# Patient Record
Sex: Female | Born: 1940
Health system: Southern US, Community
[De-identification: ages and names within clinical notes are randomized; demographics above are authoritative.]

## PROBLEM LIST (undated history)

## (undated) DIAGNOSIS — K5790 Diverticulosis of intestine, part unspecified, without perforation or abscess without bleeding: Secondary | ICD-10-CM

## (undated) DIAGNOSIS — J45909 Unspecified asthma, uncomplicated: Secondary | ICD-10-CM

## (undated) DIAGNOSIS — E119 Type 2 diabetes mellitus without complications: Secondary | ICD-10-CM

## (undated) DIAGNOSIS — K648 Other hemorrhoids: Secondary | ICD-10-CM

## (undated) DIAGNOSIS — I10 Essential (primary) hypertension: Secondary | ICD-10-CM

## (undated) DIAGNOSIS — E785 Hyperlipidemia, unspecified: Secondary | ICD-10-CM

## (undated) DIAGNOSIS — J309 Allergic rhinitis, unspecified: Secondary | ICD-10-CM

## (undated) HISTORY — DX: Other hemorrhoids: K64.8

## (undated) HISTORY — PX: CATARACT EXTRACTION: SUR2

## (undated) HISTORY — DX: Diverticulosis of intestine, part unspecified, without perforation or abscess without bleeding: K57.90

## (undated) HISTORY — PX: COLONOSCOPY: SHX174

## (undated) HISTORY — DX: Type 2 diabetes mellitus without complications: E11.9

## (undated) HISTORY — DX: Essential (primary) hypertension: I10

## (undated) HISTORY — DX: Unspecified asthma, uncomplicated: J45.909

## (undated) HISTORY — DX: Allergic rhinitis, unspecified: J30.9

## (undated) HISTORY — DX: Hyperlipidemia, unspecified: E78.5

---

## 2007-06-19 ENCOUNTER — Ambulatory Visit: Payer: Self-pay | Admitting: Gastroenterology

## 2010-11-10 ENCOUNTER — Ambulatory Visit: Payer: Self-pay | Admitting: Ophthalmology

## 2011-01-11 ENCOUNTER — Ambulatory Visit: Payer: Self-pay | Admitting: Internal Medicine

## 2011-03-02 ENCOUNTER — Ambulatory Visit: Payer: Self-pay | Admitting: Ophthalmology

## 2011-11-22 ENCOUNTER — Ambulatory Visit: Payer: Self-pay | Admitting: Family Medicine

## 2012-01-18 ENCOUNTER — Other Ambulatory Visit: Payer: Self-pay | Admitting: Diagnostic Radiology

## 2012-01-18 LAB — CREATININE, SERUM: EGFR (Non-African Amer.): >60

## 2012-01-19 ENCOUNTER — Ambulatory Visit: Payer: Self-pay

## 2013-04-10 ENCOUNTER — Emergency Department: Payer: Self-pay | Admitting: Emergency Medicine

## 2013-04-10 LAB — CBC
HCT: 35.1 % (ref 35.0–47.0)
HGB: 11.5 g/dL — ABNORMAL LOW (ref 12.0–16.0)
MCH: 30.2 pg (ref 26.0–34.0)
MCHC: 32.8 g/dL (ref 32.0–36.0)
MCV: 92 fL (ref 80–100)
Platelet: 207 10*3/uL (ref 150–440)
RBC: 3.82 10*6/uL (ref 3.80–5.20)
RDW: 13.8 % (ref 11.5–14.5)
WBC: 10 10*3/uL (ref 3.6–11.0)

## 2013-04-10 LAB — URINALYSIS, COMPLETE
Bilirubin,UR: NEGATIVE
Glucose,UR: NEGATIVE mg/dL (ref 0–75)
Hyaline Cast: 20
Nitrite: NEGATIVE
Protein: NEGATIVE
RBC,UR: 19 /HPF (ref 0–5)
Specific Gravity: 1.018 (ref 1.003–1.030)
Squamous Epithelial: 18
WBC UR: 15 /HPF (ref 0–5)

## 2013-04-10 LAB — COMPREHENSIVE METABOLIC PANEL
Albumin: 3.7 g/dL (ref 3.4–5.0)
Anion Gap: 7 (ref 7–16)
Bilirubin,Total: 0.2 mg/dL (ref 0.2–1.0)
Calcium, Total: 9.3 mg/dL (ref 8.5–10.1)
Chloride: 104 mmol/L (ref 98–107)
Co2: 26 mmol/L (ref 21–32)
Creatinine: 1.44 mg/dL — ABNORMAL HIGH (ref 0.60–1.30)
EGFR (Non-African Amer.): 36 — ABNORMAL LOW
Glucose: 123 mg/dL — ABNORMAL HIGH (ref 65–99)
Potassium: 3.4 mmol/L — ABNORMAL LOW (ref 3.5–5.1)
Sodium: 137 mmol/L (ref 136–145)
Total Protein: 7.3 g/dL (ref 6.4–8.2)

## 2013-04-10 LAB — LIPASE, BLOOD: Lipase: 71 U/L — ABNORMAL LOW (ref 73–393)

## 2013-04-16 ENCOUNTER — Ambulatory Visit: Payer: Self-pay | Admitting: Emergency Medicine

## 2013-04-16 LAB — URINALYSIS, COMPLETE
Bilirubin,UR: NEGATIVE
Blood: NEGATIVE
Glucose,UR: NEGATIVE mg/dL (ref 0–75)
Ketone: NEGATIVE
Nitrite: NEGATIVE
Ph: 6.5 (ref 4.5–8.0)
Protein: NEGATIVE
Specific Gravity: 1.01 (ref 1.003–1.030)

## 2013-04-18 LAB — URINE CULTURE

## 2013-04-22 ENCOUNTER — Ambulatory Visit: Payer: Self-pay | Admitting: Gastroenterology

## 2013-04-23 LAB — PATHOLOGY REPORT

## 2013-07-17 ENCOUNTER — Inpatient Hospital Stay: Payer: Self-pay | Admitting: Family Medicine

## 2013-07-17 LAB — BASIC METABOLIC PANEL
Anion Gap: 8 (ref 7–16)
BUN: 22 mg/dL — ABNORMAL HIGH (ref 7–18)
Calcium, Total: 9.6 mg/dL (ref 8.5–10.1)
Co2: 32 mmol/L (ref 21–32)
EGFR (African American): 42 — ABNORMAL LOW
EGFR (Non-African Amer.): 37 — ABNORMAL LOW
Glucose: 191 mg/dL — ABNORMAL HIGH (ref 65–99)
Sodium: 137 mmol/L (ref 136–145)

## 2013-07-17 LAB — CBC WITH DIFFERENTIAL/PLATELET
Basophil #: 0 10*3/uL (ref 0.0–0.1)
Basophil %: 0.2 %
HCT: 35.1 % (ref 35.0–47.0)
HGB: 11.8 g/dL — ABNORMAL LOW (ref 12.0–16.0)
Lymphocyte %: 6.3 %
MCH: 30.7 pg (ref 26.0–34.0)
MCHC: 33.5 g/dL (ref 32.0–36.0)
MCV: 92 fL (ref 80–100)
Monocyte #: 0.4 x10 3/mm (ref 0.2–0.9)
Neutrophil #: 11.9 10*3/uL — ABNORMAL HIGH (ref 1.4–6.5)
RBC: 3.83 10*6/uL (ref 3.80–5.20)
RDW: 13.7 % (ref 11.5–14.5)
WBC: 13.2 10*3/uL — ABNORMAL HIGH (ref 3.6–11.0)

## 2013-07-17 LAB — PRO B NATRIURETIC PEPTIDE: B-Type Natriuretic Peptide: 235 pg/mL — ABNORMAL HIGH (ref 0–125)

## 2013-07-17 LAB — MAGNESIUM: Magnesium: 1.1 mg/dL — ABNORMAL LOW

## 2013-07-17 LAB — CK TOTAL AND CKMB (NOT AT ARMC)
CK, Total: 104 U/L (ref 21–215)
CK-MB: 1.2 ng/mL (ref 0.5–3.6)

## 2013-07-18 LAB — COMPREHENSIVE METABOLIC PANEL
Albumin: 3.3 g/dL — ABNORMAL LOW (ref 3.4–5.0)
Alkaline Phosphatase: 81 U/L (ref 50–136)
BUN: 23 mg/dL — ABNORMAL HIGH (ref 7–18)
Calcium, Total: 9 mg/dL (ref 8.5–10.1)
Co2: 30 mmol/L (ref 21–32)
Creatinine: 1.59 mg/dL — ABNORMAL HIGH (ref 0.60–1.30)
EGFR (African American): 37 — ABNORMAL LOW
Osmolality: 281 (ref 275–301)
SGOT(AST): 18 U/L (ref 15–37)
SGPT (ALT): 17 U/L (ref 12–78)
Sodium: 135 mmol/L — ABNORMAL LOW (ref 136–145)

## 2013-07-18 LAB — CBC WITH DIFFERENTIAL/PLATELET
Eosinophil %: 0 %
HCT: 32.4 % — ABNORMAL LOW (ref 35.0–47.0)
HGB: 10.8 g/dL — ABNORMAL LOW (ref 12.0–16.0)
MCHC: 33.2 g/dL (ref 32.0–36.0)
Monocyte #: 0.1 x10 3/mm — ABNORMAL LOW (ref 0.2–0.9)
Neutrophil #: 17.4 10*3/uL — ABNORMAL HIGH (ref 1.4–6.5)
RDW: 13.6 % (ref 11.5–14.5)
WBC: 18 10*3/uL — ABNORMAL HIGH (ref 3.6–11.0)

## 2013-07-18 LAB — CK TOTAL AND CKMB (NOT AT ARMC)
CK-MB: 1.3 ng/mL (ref 0.5–3.6)
CK-MB: 1.4 ng/mL (ref 0.5–3.6)

## 2013-07-18 LAB — TROPONIN I: Troponin-I: 0.07 ng/mL — ABNORMAL HIGH

## 2013-07-22 LAB — CULTURE, BLOOD (SINGLE)

## 2013-07-22 LAB — PLATELET COUNT: Platelet: 209 10*3/uL (ref 150–440)

## 2014-03-04 ENCOUNTER — Inpatient Hospital Stay: Payer: Self-pay | Admitting: Internal Medicine

## 2014-03-04 LAB — COMPREHENSIVE METABOLIC PANEL
ALT: 14 U/L (ref 12–78)
AST: 21 U/L (ref 15–37)
Albumin: 3.8 g/dL (ref 3.4–5.0)
Alkaline Phosphatase: 67 U/L
Anion Gap: 6 — ABNORMAL LOW (ref 7–16)
BILIRUBIN TOTAL: 0.3 mg/dL (ref 0.2–1.0)
BUN: 22 mg/dL — ABNORMAL HIGH (ref 7–18)
CO2: 31 mmol/L (ref 21–32)
Calcium, Total: 9.4 mg/dL (ref 8.5–10.1)
Chloride: 104 mmol/L (ref 98–107)
Creatinine: 1.39 mg/dL — ABNORMAL HIGH (ref 0.60–1.30)
EGFR (African American): 43 — ABNORMAL LOW
EGFR (Non-African Amer.): 37 — ABNORMAL LOW
Glucose: 172 mg/dL — ABNORMAL HIGH (ref 65–99)
Osmolality: 289 (ref 275–301)
Potassium: 3.6 mmol/L (ref 3.5–5.1)
Sodium: 141 mmol/L (ref 136–145)
Total Protein: 7.3 g/dL (ref 6.4–8.2)

## 2014-03-04 LAB — CBC
HCT: 36.2 % (ref 35.0–47.0)
HGB: 11.4 g/dL — ABNORMAL LOW (ref 12.0–16.0)
MCH: 29.4 pg (ref 26.0–34.0)
MCHC: 31.4 g/dL — ABNORMAL LOW (ref 32.0–36.0)
MCV: 94 fL (ref 80–100)
Platelet: 173 10*3/uL (ref 150–440)
RBC: 3.86 10*6/uL (ref 3.80–5.20)
RDW: 14.3 % (ref 11.5–14.5)
WBC: 8.5 10*3/uL (ref 3.6–11.0)

## 2014-03-04 LAB — TROPONIN I: Troponin-I: <0.02 ng/mL

## 2014-03-05 LAB — CBC WITH DIFFERENTIAL/PLATELET
Basophil #: 0 10*3/uL (ref 0.0–0.1)
Basophil %: 0.1 %
Eosinophil #: 0 10*3/uL (ref 0.0–0.7)
Eosinophil %: 0.1 %
HCT: 33.1 % — ABNORMAL LOW (ref 35.0–47.0)
HGB: 10.5 g/dL — ABNORMAL LOW (ref 12.0–16.0)
LYMPHS PCT: 6.6 %
Lymphocyte #: 0.7 10*3/uL — ABNORMAL LOW (ref 1.0–3.6)
MCH: 29.5 pg (ref 26.0–34.0)
MCHC: 31.8 g/dL — AB (ref 32.0–36.0)
MCV: 93 fL (ref 80–100)
Monocyte #: 0.1 x10 3/mm — ABNORMAL LOW (ref 0.2–0.9)
Monocyte %: 0.9 %
Neutrophil #: 9.1 10*3/uL — ABNORMAL HIGH (ref 1.4–6.5)
Neutrophil %: 92.3 %
Platelet: 185 10*3/uL (ref 150–440)
RBC: 3.57 10*6/uL — ABNORMAL LOW (ref 3.80–5.20)
RDW: 14.2 % (ref 11.5–14.5)
WBC: 9.9 10*3/uL (ref 3.6–11.0)

## 2014-03-05 LAB — BASIC METABOLIC PANEL
Anion Gap: 8 (ref 7–16)
BUN: 24 mg/dL — AB (ref 7–18)
CREATININE: 1.37 mg/dL — AB (ref 0.60–1.30)
Calcium, Total: 9.4 mg/dL (ref 8.5–10.1)
Chloride: 101 mmol/L (ref 98–107)
Co2: 29 mmol/L (ref 21–32)
EGFR (African American): 44 — ABNORMAL LOW
GFR CALC NON AF AMER: 38 — AB
Glucose: 176 mg/dL — ABNORMAL HIGH (ref 65–99)
Osmolality: 284 (ref 275–301)
Potassium: 3.9 mmol/L (ref 3.5–5.1)
SODIUM: 138 mmol/L (ref 136–145)

## 2014-03-07 LAB — HEMOGLOBIN A1C: Hemoglobin A1C: 7.1 % — ABNORMAL HIGH (ref 4.2–6.3)

## 2014-06-25 ENCOUNTER — Inpatient Hospital Stay: Payer: Self-pay | Admitting: Internal Medicine

## 2014-06-25 LAB — CBC
HCT: 35 % (ref 35.0–47.0)
HGB: 11.1 g/dL — ABNORMAL LOW (ref 12.0–16.0)
MCH: 30.1 pg (ref 26.0–34.0)
MCHC: 31.7 g/dL — ABNORMAL LOW (ref 32.0–36.0)
MCV: 95 fL (ref 80–100)
Platelet: 208 10*3/uL (ref 150–440)
RBC: 3.69 10*6/uL — AB (ref 3.80–5.20)
RDW: 14 % (ref 11.5–14.5)
WBC: 9.2 10*3/uL (ref 3.6–11.0)

## 2014-06-25 LAB — BASIC METABOLIC PANEL
Anion Gap: 7 (ref 7–16)
BUN: 15 mg/dL (ref 7–18)
CALCIUM: 8.8 mg/dL (ref 8.5–10.1)
CHLORIDE: 106 mmol/L (ref 98–107)
CO2: 29 mmol/L (ref 21–32)
Creatinine: 1.26 mg/dL (ref 0.60–1.30)
EGFR (Non-African Amer.): 44 — ABNORMAL LOW
GFR CALC AF AMER: 53 — AB
Glucose: 143 mg/dL — ABNORMAL HIGH (ref 65–99)
OSMOLALITY: 286 (ref 275–301)
POTASSIUM: 4 mmol/L (ref 3.5–5.1)
Sodium: 142 mmol/L (ref 136–145)

## 2014-06-25 LAB — TROPONIN I

## 2014-06-26 LAB — CBC WITH DIFFERENTIAL/PLATELET
BASOS ABS: 0 10*3/uL (ref 0.0–0.1)
Basophil %: 0.2 %
EOS ABS: 0 10*3/uL (ref 0.0–0.7)
Eosinophil %: 0.1 %
HCT: 35.5 % (ref 35.0–47.0)
HGB: 11.3 g/dL — ABNORMAL LOW (ref 12.0–16.0)
LYMPHS ABS: 0.8 10*3/uL — AB (ref 1.0–3.6)
Lymphocyte %: 5.1 %
MCH: 29.9 pg (ref 26.0–34.0)
MCHC: 31.8 g/dL — ABNORMAL LOW (ref 32.0–36.0)
MCV: 94 fL (ref 80–100)
MONO ABS: 0.2 x10 3/mm (ref 0.2–0.9)
Monocyte %: 1.4 %
Neutrophil #: 15 10*3/uL — ABNORMAL HIGH (ref 1.4–6.5)
Neutrophil %: 93.2 %
Platelet: 225 10*3/uL (ref 150–440)
RBC: 3.79 10*6/uL — ABNORMAL LOW (ref 3.80–5.20)
RDW: 14 % (ref 11.5–14.5)
WBC: 16.1 10*3/uL — ABNORMAL HIGH (ref 3.6–11.0)

## 2014-06-26 LAB — BASIC METABOLIC PANEL
Anion Gap: 10 (ref 7–16)
BUN: 18 mg/dL (ref 7–18)
CHLORIDE: 106 mmol/L (ref 98–107)
Calcium, Total: 9.4 mg/dL (ref 8.5–10.1)
Co2: 25 mmol/L (ref 21–32)
Creatinine: 1.25 mg/dL (ref 0.60–1.30)
EGFR (Non-African Amer.): 45 — ABNORMAL LOW
GFR CALC AF AMER: 54 — AB
Glucose: 164 mg/dL — ABNORMAL HIGH (ref 65–99)
Osmolality: 287 (ref 275–301)
POTASSIUM: 4.1 mmol/L (ref 3.5–5.1)
SODIUM: 141 mmol/L (ref 136–145)

## 2014-06-27 LAB — CBC WITH DIFFERENTIAL/PLATELET
Basophil #: 0 10*3/uL (ref 0.0–0.1)
Basophil %: 0.1 %
Eosinophil #: 0 10*3/uL (ref 0.0–0.7)
Eosinophil %: 0 %
HCT: 32.4 % — AB (ref 35.0–47.0)
HGB: 9.9 g/dL — ABNORMAL LOW (ref 12.0–16.0)
LYMPHS PCT: 4.6 %
Lymphocyte #: 0.7 10*3/uL — ABNORMAL LOW (ref 1.0–3.6)
MCH: 28.7 pg (ref 26.0–34.0)
MCHC: 30.5 g/dL — ABNORMAL LOW (ref 32.0–36.0)
MCV: 94 fL (ref 80–100)
Monocyte #: 0.3 x10 3/mm (ref 0.2–0.9)
Monocyte %: 2.2 %
NEUTROS ABS: 13.9 10*3/uL — AB (ref 1.4–6.5)
NEUTROS PCT: 93.1 %
Platelet: 195 10*3/uL (ref 150–440)
RBC: 3.44 10*6/uL — AB (ref 3.80–5.20)
RDW: 14.1 % (ref 11.5–14.5)
WBC: 15 10*3/uL — ABNORMAL HIGH (ref 3.6–11.0)

## 2014-06-29 LAB — EXPECTORATED SPUTUM ASSESSMENT W GRAM STAIN, RFLX TO RESP C

## 2014-08-20 DIAGNOSIS — J449 Chronic obstructive pulmonary disease, unspecified: Secondary | ICD-10-CM | POA: Insufficient documentation

## 2014-10-24 DIAGNOSIS — J449 Chronic obstructive pulmonary disease, unspecified: Secondary | ICD-10-CM | POA: Diagnosis not present

## 2015-01-16 NOTE — Discharge Summary (Signed)
PATIENT NAME:  Sharon Henderson, Sharon Henderson MR#:  [id number] DATE OF BIRTH:  08/16/1941  DATE OF ADMISSION:  07/17/2013 DATE OF DISCHARGE: 07/22/2013.   REASON FOR ADMISSION: Shortness of breath.   DISCHARGE DIAGNOSES: 1. Acute asthma exacerbation.  2. Acute bronchitis.  3. Type 2 diabetes.  4. Hypokalemia.  5. Chronic kidney disease.  6. Chronic disease anemia.   DISPOSITION: Home.  FOLLOW-UP: Dr. Juanetta GoslingHawkins in one to two weeks.   MEDICATIONS ON DISCHARGE: Losartan 100 mg once a day, metoprolol 25 mg twice daily, metformin 1000 mg twice daily, omeprazole 20 mg twice daily. Prednisone taper starting at 60 mg, 10 mg decreased every day. Insulin aspart NovoLog insulin sliding scale while the patient is on steroids,  insulin Levemir 10 units subcutaneously twice daily while the patient is on steroids, aspirin 81 mg daily, Symbicort 160/4.5 mg twice daily, furosemide 40 mg take 2 tablets once a day,  levofloxacin 750 mg every 48 hours for 6 days. Combivent as needed for shortness of breath.   DIET: Carbohydrate control.   HOSPITAL COURSE: A 74 year old female with history of asthma admitted with a chief complaint of shortness of breath. She has diabetes, hypertension, hyperlipidemia, osteoarthritis. She has asthma  complaint of sore throat, shortness of breath for 2 to 3 days, had a  significant problem breathing and getting really short of breath with walking. Cough with some production, pink phlegm without  fever or chills. Significant wheezing and tightness of the chest that got worse as the days went by. The patient decided to come to the hospital. Evaluation shows patient had significant wheezing with high blood pressure. Chest x-ray showed hyperinflation.  No evidence of pneumonia.  GFR was around 42, which is her baseline. Her white count was elevated 13. Hemoglobin was 11.8. Creatinine was 1.42. The patient admitted for asthma exacerbation and was started on around-the-clock nebulizers, steroids IV 60 mg  every six hours. The patient had a very slow improvement, took several days to take her off IV steroids. We started weaning her off the 26th and by the 27th she felt ready to go home. The patient had significant wheezing through the hospitalization, though she is feeling better. She is able to ambulate. She is not having any significant wheezing or shortness of breath anymore. As far as chronic kidney disease, it is stable. The patient was taking off losartan for couple of days. Her blood pressure is stable to start it back again. The patient will be discharged on losartan the same dose, Lasix at the same dose 80 mg once daily, but we are going to hold hydrochlorothiazide as The patient is already on Lasix.   The patient is discharged in good condition. Follow up with Dr. Juanetta GoslingHawkins in the next 1 to 2 weeks. I spent about 60 minutes with this discharge today.   ____________________________ Felipa Furnaceoberto Sanchez Gutierrez, MD rsg:sg D: 07/22/2013 10:38:32 ET T: 07/22/2013 11:39:08 ET JOB#: [id number]  cc: Felipa Furnaceoberto Sanchez Gutierrez, MD, <Dictator> Janeann ForehandJames H. Hawkins Jr., MD  Pearletha FurlOBERTO SANCHEZ GUTIERRE MD ELECTRONICALLY SIGNED 08/05/2013 6:45

## 2015-01-16 NOTE — H&P (Signed)
PATIENT NAME:  Sharon Henderson, Sharon Henderson MR#:  [id number] DATE OF BIRTH:  08/16/1941  DATE OF ADMISSION:  07/17/2013  PRIMARY CARE PHYSICIAN: Dr. Juanetta GoslingHawkins   HISTORY OF PRESENT ILLNESS:  The patient is a 74 year old African American female with history of diabetes mellitus diet controlled, hypertension, hyperlipidemia, osteoarthritis, also of asthma since childhood, presents to the hospital with complaints of sore throat as well as shortness of breath and wheezing. According to the patient, she was doing well up until approximately 2 to 3 days ago when she started having problems with shortness of breath and today she was not able to breathe or walk. She has been coughing and producing some white and thick phlegm; however, denies any fevers or chills, admits of significant wheezing as well as some tightness in her chest. She was not able to sleep. She woke up at 4:00 a.m. According to her, her asthma started at a young age when she was having some allergies and then she was using just over-the-counter medications with some help. Never had any significant problems,  so that she has prescription medication of albuterol only for her asthma. Now her chest x-ray looks like she has bronchitis. She has been given a few nebulizers here in the hospital; however, with no significant improvement and she was also hypoxic. Her oxygen saturation was in the 80s on room air and now she is at 3 liters of oxygen through nasal cannula and her oxygen saturations are good in 90s.   PAST MEDICAL HISTORY:  Significant for history of asthma, allergies to dust and mold as well as shellfish, diabetes mellitus diet controlled, hypertension, hyperlipidemia, osteoarthritis, diverticulosis, internal hemorrhoids on colonoscopy in the past also colon polyps,   PAST SURGICAL HISTORY: Cataract surgery as well as colonoscopy.   MEDICATIONS:  Unknown at this patient.  The patient's family is to bring her medications, but apparently in the past she was  on albuterol inhaler, aspirin, hydrochlorothiazide, losartan, metoprolol.   ALLERGIES: As above, practically none, mostly shellfish.   FAMILY HISTORY: Hypertension, also congestive heart failure as well as CVA, the patient's mother had asthma. No cancers in the family or diabetes.   SOCIAL HISTORY: The patient is widowed, lives alone. She has five children who live close by. She never smoked, does not drink any alcohol. 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   REVIEW OF SYSTEMS:  GENERAL:  Positive for feeling tired and weak over the past few days, having some chest pains, weight lost about 6 pounds since last year. It is intentional. Dr. Juanetta GoslingHawkins wanted her to lose and she has been losing to control her diabetes. She admits of having some blurring of vision for which she uses bifocal glasses. She has postnasal drip, as well as sinus congestion intermittently. She has been coughing, wheezing as well as producing some phlegm which is white in color and thick.  Very short of breath over the past few days. Admits of having some chest pains mostly with shortness of breath whenever  she walks around. She has been having pains and also palpitations on exertion. Admits of stress incontinence whenever she coughs, she loses sometimes urine.  Admits of osteoarthritis in her knees as well as some numbness in the right hand intermittently as well as weakness intermittently mostly at nighttime. Denies any fevers, chills, fatigue, (Dictation Anomaly) no  double vision, glaucoma or cataracts.  ENT: Denies any tinnitus, allergies, epistaxis, sinus tenderness or difficulty swallowing.  RESPIRATORY: Denies any hemoptysis, chronic obstructive pulmonary disease or  other diagnoses.  Positive asthma, painful respirations.  CARDIOVASCULAR: Denies any orthopnea, edema, arrhythmias or syncope. GASTROINTESTINAL:  Denies nausea, vomiting, diarrhea or constipation.    GENITOURINARY: Denies dysuria, hematuria, frequency.  ENDOCRINE:   (Dictation Anomaly) denies heat or cold intolerance or thirst.  HEMATOLOGIC: Denies anemia, easy bruising, bleeding or swollen glands.  SKIN: Denies any acne, rash, lesions or change in moles.  MUSCULOSKELETAL: Denies joint cramps, swelling.  NEUROLOGIC: (Dictation Anomaly) no epilepsy, tremors.  PSYCHIATRIC:  Denies anxiety or depression.    PHYSICAL EXAMINATION: VITAL SIGNS: On arrival to the hospital, the patient's vitals: Temperature is 98.1, pulse was 79, respiratory rate was 18, blood pressure 193/104. Saturation was 97% on oxygen therapy.  GENERAL:  She is obese African American female in moderate distress secondary to respiratory distress, sitting on the stretcher.  HEENT: Her pupils are equal, reactive to light. Extraocular movements intact. No icterus or conjunctivitis. Has normal hearing. No pharyngeal erythema. Mucosa is moist.  NECK: No masses. Supple, nontender. Thyroid is not enlarged. No adenopathy. No JVD or carotid bruits bilaterally. Full range of motion.  LUNGS: Diminished breath sounds and rales as well as wheezing bilaterally. Labored inspirations as well as increased effort to breathe. In moderate respiratory distress; however, no dullness to percussion, rhonchi were heard bilaterally.  CARDIOVASCULAR: S1, S2. There is no murmur rhythm is regular. PMI not lateralized.  Chest is nontender to palpation.  1+ pedal pulses. Trace lower extremity edema. No calf tenderness or cyanosis.  ABDOMEN: Soft, nontender. Bowel sounds are present. No hepatosplenomegaly or masses were noted.  MUSCULOSKELETAL:   Able to move all extremities. No cyanosis, degenerative joint disease or kyphosis. Gait not tested.  SKIN: No skin rashes, lesions, erythema, nodularity or induration. It was warm and dry to palpation.  LYMPHATIC: The patient does have adenopathy in the cervical region in the submandibular area.  NEUROLOGICAL: Cranial nerves grossly intact. Sensory is intact. No dysarthria or aphasia.   PSYCHIATRIC: The patient is alert, oriented to time, person and place, cooperative. Memory is good. No significant confusion, agitation or depression.   LABORATORY DATA: BMP showed a BUN and creatinine 22 and 1.42. Beta-type natriuretic peptide was 235, glucose 191, potassium 3.4. Estimated GFR for African American would be 42. The patient had white blood cell count elevation to 13.2, hemoglobin 11.8 and platelet count 208, absolute neutrophil count was 11.9.   RADIOLOGIC STUDIES: Chest x-ray PA and lateral, according to radiologist 07/17/2013, showed hyperinflation consistent with chronic obstructive pulmonary disease, no evidence of pneumonia or congestive heart failure, cannot exclude acute bronchitis in the appropriate clinical setting. EKG is normal, no acute STT changes.  ASSESSMENT AND PLAN: 1.  Asthma with acute exacerbation. Admit the patient to the medical floor. Continue her on steroids IV,  Symbicort 112, DuoNeb as well as  Levaquin IV.  2.  Bronchitis. Continue Levaquin, get sputum cultures.  3.  Diabetes mellitus type 2, continue sliding scale insulin as well as 1800 ADA. Patient's family is to bring her medication list.  4.  Hypokalemia, supplement orally, get magnesium level.  5.  Anemia. Get guaiac.  6.  Chronic kidney disease seems to be stable.   TIME SPENT: 50 minutes on the patient.   ____________________________ Katharina Caper, MD rv:cc D: 07/17/2013 16:20:00 ET T: 07/17/2013 18:02:04 ET JOB#: [id number]  cc: Dr. Hollice Espy, MD, <Dictator>   Kedrick Mcnamee MD ELECTRONICALLY SIGNED 08/22/2013 18:26

## 2015-01-17 NOTE — H&P (Signed)
PATIENT NAME:  Sharon Henderson, Sharon Henderson MR#:  [id number] DATE OF BIRTH:  08/16/1941  PRIMARY CARE PHYSICIAN: Dr. Juanetta GoslingHawkins   CHIEF COMPLAINT:  Shortness of breath and cough.   HISTORY OF PRESENT ILLNESS: The patient is a 74 year old pleasant African American female with past medical history of childhood asthma, is presenting to the ED with a chief complaint of shortness of breath since yesterday. She thinks that she has some cold and cough which eventually has caused shortness of breath. Her shortness of breath has been getting worse since yesterday. Today, she could not breathe and came into the ED. She is bringing up some phlegm, but denies any fever or chills. No other complaints in the ED. The patient was given some nebulizer treatments with no significant improvement. As the patient was still short of breath and wheezing diffusely, she was given Solu-Medrol and IV levofloxacin. Hospitalist is called to admit the patient.   PAST MEDICAL HISTORY: History of asthma, allergic rhinitis, diabetes mellitus, diet controlled, hypertension, hyperlipidemia, diverticulosis, internal hemorrhoids diagnosed on colonoscopy in the past.   PAST SURGICAL HISTORY: Cataract surgery and colonoscopy.   ALLERGIES: SHELLFISH AND MOLD.  FAMILY HISTORY: Hypertension, congestive heart failure runs in her family. Mother had history of asthma.    PSYCHOSOCIAL HISTORY: Lives at home, lives with her daughter.  Never smoked in her life.   Denies alcohol or illicit drug usage. She used to work in Dentisttextile industry.  HOME MEDICATIONS: Qvar 40 mcg 2 puffs inhalation 2 times a day, ProAir 2 puffs inhalation 4 times a day,  metoprolol 25 mg 2 times a day, metformin 500 mg 2 tablets 2 times a day, losartan 100 mg p.o. once daily, furosemide 40 mg 2 tablets p.o. once daily, Flonase 1-2 sprays nasally once daily, aspirin 81 mg once daily,  albuterol, ipratropium inhalation 3 times a day, Tylenol 2 tablets p.o. every 6 hours as needed.    REVIEW  OF SYSTEMS: CONSTITUTIONAL: Denies any fever or fatigue. Complaining of weakness.  EYES: Denies blurry vision, double vision, inflammation, or cataracts.  EARS, NOSE, THROAT: Denies epistaxis or discharge, but has nasal discharge. Denies any tinnitus. RESPIRATORY: Complaining of cough. Has chronic history of asthma. Denies any history of COPD. CARDIOVASCULAR:  No chest pain, palpitations, or syncope.  GASTROINTESTINAL: Denies nausea, vomiting, diarrhea, abdominal pain, hematemesis.  GENITOURINARY: No dysuria or hematuria.  ENDOCRINE: Denies polyuria, nocturia, thyroid problems. Chronic history of diabetes mellitus. HEMATOLOGIC AND LYMPHATIC: No anemia, easy bruising, bleeding.  INTEGUMENTARY: No acne, rash, lesions.  MUSCULOSKELETAL: No joint pain in the neck and back. NEUROLOGIC: Denies any vertigo, ataxia, dementia. PSYCHIATRIC:Denies any BPD and schizophrenia  PHYSICAL EXAMINATION: VITAL SIGNS: Temperature 98.1, pulse 111, respirations 24, blood pressure is 159/106, pulse oximetry is 98%.  GENERAL APPEARANCE: Not in any acute distress. Moderately built and nourished.  HEENT: Normocephalic, atraumatic. Pupils are equally reacting to light and accommodation. No scleral icterus. No conjunctival injection. No sinus tenderness. No postnasal drip. Moist mucous membranes.  NECK: Supple. No JVD. No thyromegaly. Range of motion is intact.  LUNGS: Diffuse wheezing is present. No accessory muscle use.  No anterior chest wall tenderness on palpation.  CARDIAC: S1, S2 normal. Regular rate and rhythm. No murmurs.  GASTROINTESTINAL: Soft. Bowel sounds are positive in all 4 quadrants. Nontender, nondistended. No hepatosplenomegaly. No masses felt.  NEUROLOGIC: Awake, alert, and oriented x 3. Cranial nerves II through XII are grossly intact. Motor and sensory are intact. Reflexes are 2+.  EXTREMITIES: No edema, no cyanosis, no clubbing.  SKIN: Warm to touch. Normal turgor. No rashes. No lesions.   MUSCULOSKELETAL: No joint effusion, tenderness, erythema.  PSYCHIATRIC: Normal mood and affect.  LABORATORY AND IMAGING STUDIES: Chest x-ray, no acute cardiopulmonary findings. A 12-lead EKG: Normal sinus rhythm and normal EKG. Troponin less than 0.02. WBC 9.2, hemoglobin 11.1, hematocrit 35, platelets are 208,000. BMP: Glucose is 143. BUN, creatinine, sodium, potassium, and chloride are normal. Anion gap 7, GFR is at 53. Serum osmolality and calcium are normal.   ASSESSMENT AND PLAN: A 74 year old [place] American female who came into the ED with a chief complaint of shortness of breath since yesterday and has been coughing with some nasal congestion prior to this.  Will be admitted with the following assessment and plan: 1.  Acute respiratory distress secondary to acute exacerbation of asthma. We will admit her to  non-telemetry bed where we will provide her intravenous Solu-Medrol, nebulizer treatments, and intravenous levofloxacin.  2.  History of hypertension. Blood pressure is elevated. Will resume her home medications and also give her p.r.n. Lopressor.  3.  Diabetes mellitus. Continue insulin sliding scale and the patient will be on metformin.  4.  Obesity. The patient will be treated with food and lifestyle changes with diet and exercise. 5.  We will provide her gastrointestinal and deep vein thrombosis prophylaxis with Pepcid and Lovenox subcutaneous.   Plan of care was discussed in detail with the patient and her daughters at bedside. They all verbalized understanding of the plan.   TOTAL TIME SPENT: 45 minutes    ____________________________ Ramonita Lab, MD ag:LT D: 06/25/2014 16:36:00 ET T: 06/25/2014 17:09:16 ET JOB#: [id number]  cc: Ramonita Lab, MD, <Dictator> Ramonita Lab MD ELECTRONICALLY SIGNED 06/27/2014 13:46

## 2015-01-17 NOTE — Discharge Summary (Signed)
PATIENT NAME:  Sharon Henderson, Sharon Henderson MR#:  [id number] DATE OF BIRTH:  08/16/1941  DATE OF ADMISSION:  06/25/2014 DATE OF DISCHARGE:  06/28/2014  ADMISSION DIAGNOSIS: Acute respiratory failure.   DISCHARGE DIAGNOSES: 1. Acute respiratory failure secondary to asthma exacerbation.  2. Admission  exacerbation.   CONSULTS: None.   Discharge white blood cells 15, hemoglobin 10, hematocrit 33, platelets of 195,000. Blood cultures are negative to date.   HOSPITAL COURSE: A 74 year old female who presented with shortness of breath, increased respiratory rate and hypoxia. For further details, please refer to H and P.  1. Acute respiratory failure with increased respiratory rate and oxygen requirement on admission secondary to asthma exacerbation as outlined below.  2. Asthma exacerbation. The patient was placed on high-dose steroids, inhalers, DuoNebs and antibiotics for asthma exacerbation. Chest x-ray did not show any evidence of pneumonia. She is much improved. She will need p.o. steroids at discharge. Initially, she was placed on Advair, but she does take Qvar at home, which she will continue. She would benefit from allergy testing and pulmonary consult. The family would [facility] so we will defer this to her primary care physician, Dr. Juanetta GoslingHawkins.  3. Hypertension. The patient was continued on losartan and metoprolol. Her essential hypertension was controlled.  4. Diabetes type 2. Controlled on her outpatient medications.   DISCHARGE MEDICATIONS: 1. Losartan 100 mg daily.  2. Metoprolol 25 mg b.i.d.  3. Metformin 500 mg 2 tablets b.i.d.  4. Omeprazole 20 mg b.i.d.  5. Aspirin 81 mg daily.  6. Flonase 50 mcg inhalation spray 1 to 2 sprays daily.  7. Albuterol ipratropium 3 mL 3 times a day.  8. ProAir HFA 2 puffs 4 times a day p.r.n.  9. Lasix 40 mg 2 tablets daily.  10. Allegra 180 mg daily. 11. Tylenol 325 mg 2 tablets q. 6 hours p.r.n.  12. Qvar 2 puffs  b.i.d.  13. Prednisone taper starting at 60 mg; taper by 10 mg every 2 days.  14. Tussionex 5 mL q.12 hours.  15. Levaquin 750 mg q. 48 hours x3 days; start on 06/29/2014.   DISCHARGE DIET: Low sodium.   DISCHARGE ACTIVITY: As tolerated.   DISCHARGE FOLLOWUP: The patient will follow up with Dr. Juanetta GoslingHawkins. Again, she may need referral to an allergist pulmonologist.   TIME SPENT: 35 minutes.     ____________________________ Janyth ContesSital P. Juliene PinaMody, MD spm:lm D: 06/28/2014 11:59:55 ET T: 06/29/2014 01:45:08 ET JOB#: [id number]  cc: Relena Ivancic P. Juliene PinaMody, MD, <Dictator> Janeann ForehandJames H. Hawkins Jr., MD Janyth ContesSITAL P Bracken Moffa MD ELECTRONICALLY SIGNED 06/29/2014 14:05

## 2015-01-17 NOTE — H&P (Signed)
PATIENT NAME:  Sharon Henderson, Sharon Henderson MR#:  [id number] DATE OF BIRTH:  Jun 06, 1941  DATE OF ADMISSION:  03/04/2014  PRIMARY CARE PHYSICIAN: Dr. Juanetta Gosling.  CHIEF COMPLAINT: Shortness of breath.   HISTORY OF PRESENT ILLNESS: This is a 74 year old female with history of asthma and bronchitis. She presents with a flare-up. Yesterday, she started developing shortness of breath. Last night got worse. She decided to come in prior to things getting worse. She felt a tightness in the chest and a rattling in the chest. She does have a headache. She has been coughing up some clear phlegm. In the ER, she had a persistent wheezing after nebulizer treatments, and hospitalist services were contacted for further evaluation.   PAST MEDICAL HISTORY: Hypertension, borderline diabetes, diverticulitis, asthma.   PAST SURGICAL HISTORY: None.   ALLERGIES: SHELLFISH.   MEDICATIONS: As per prescription writer include: Aspirin 81 mg daily, Flonase 50 mcg spray one spray each nostril daily, Lasix 80 mg daily, loratadine 10 mg daily, losartan 100 mg daily, metformin extended-release 1000 mg twice a day, metoprolol 25 mg twice a day, omeprazole 20 mg twice a day, Qvar 40 mcg/inhalations 2 puffs twice a day.   SOCIAL HISTORY: No smoking. Never smoked in the past. No alcohol. No drug use. 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   FAMILY HISTORY: Father with stroke and hypertension. Mother with a stroke and bronchitis and asthma also.   REVIEW OF SYSTEMS:  CONSTITUTIONAL: Positive for sweats. No fever or chills. Positive for fatigue. No weight loss. No weight gain.   HEENT: Eyes: She does wear glasses. Ears, nose, mouth and throat: Positive for runny nose, decreased hearing. Positive for sore throat. No difficulty swallowing.  CARDIOVASCULAR: Positive for chest pain with shortness of breath.  RESPIRATORY: Positive for shortness of breath, positive for cough. Positive for wheeze, coughing up clear phlegm. No  hemoptysis.  GASTROINTESTINAL: Positive for soreness in the abdomen. No nausea or vomiting and no diarrhea. No constipation. No bright red blood per rectum. No melena.  GENITOURINARY: No burning on urination. No hematuria.  MUSCULOSKELETAL: Positive for joint pain.  INTEGUMENT: No rashes or eruptions.  NEUROLOGIC: No fainting or blackouts.  PSYCHIATRIC: No anxiety or depression.  ENDOCRINE: No thyroid problems.  HEMATOLOGIC AND LYMPHATIC: No anemia, no easy bruising or bleeding.   PHYSICAL EXAMINATION: VITAL SIGNS: Temperature 98.3, pulse 112, respirations 22, blood pressure 193/94, pulse oximetry 93% on room air.  IN GENERAL: No respiratory distress, breathing comfortably now. Audible wheeze heard without stethoscope.  HEENT: Eyes: Conjunctivae and lids normal. Pupils equal, round, and reactive to light. Extraocular muscles intact. No nystagmus. Ears, nose, mouth and throat: Tympanic membranes: No erythema. Nasal mucosa: No erythema. Throat: No erythema, no exudate seen. Lips and gums: No lesions.  NECK: No JVD. No bruits. Positive for lymphadenopathy. No thyromegaly. No thyroid nodules palpated.  RESPIRATORY: Decreased breath sounds bilaterally. Positive wheeze throughout entire lung field.  CARDIOVASCULAR: S1 and S2, tachycardic. No gallops, rubs, or murmurs heard. Carotid upstroke 2+ bilaterally. No bruits.  EXTREMITIES: Dorsalis pedis pulses 2+ bilaterally. Trace edema of the lower extremity.  ABDOMEN: Soft. Slight tenderness in the epigastric area, right upper quadrant area. No organomegaly/splenomegaly. Normoactive bowel sounds. No masses felt.  LYMPHATIC: No lymph nodes in the neck.  MUSCULOSKELETAL: No clubbing, cyanosis or edema.  PSYCHIATRIC: The patient is alert, oriented to person, place, and time.  NEUROLOGIC: Cranial nerves II through XII grossly intact. Deep tendon reflexes 1+ bilateral lower extremity.  PSYCHIATRIC: The patient is oriented to person, place, and time.    LABORATORY AND RADIOLOGICAL DATA: EKG shows sinus tachycardia at 112 beats per minute. White blood cell count 8.5, H and H 11.4 and 36.2, platelet count 173. Glucose 172, BUN 22, creatinine 1.39, sodium 141, potassium 3.6, chloride 104, CO2 31, calcium 9.4. Liver function tests normal range. Troponin negative. Chest x-ray read as no active disease.   ASSESSMENT AND PLAN: 1. Asthmatic bronchitis with persistent wheeze. We will admit as inpatient. Give IV Solu-Medrol. The patient received 125 mg IV once in the Emergency Room. We will continue 60 mg IV q.6 hours. We will give Zithromax orally, nebulizer treatments. Continue Qvar equivalent  Flovent while here.  2. Hypertension which is accelerated on admission, likely secondary to difficulty breathing. Continue usual medications and continue to monitor.  3. Borderline diabetes on metformin. Sugars will probably be high while on steroids. We will also put on sliding scale.  4. Obesity, with a body mass index of 32.0. Weight loss is recommended.  5. Slight dehydration. Decrease Lasix from 80 mg down to 40 starting tomorrow.   TIME SPENT ON ADMISSION: 55 minutes.     ____________________________ Herschell Dimesichard J. Renae GlossWieting, MD rjw:sg D: 03/04/2014 13:26:00 ET T: 03/04/2014 13:46:03 ET JOB#: [id number]  cc: Herschell Dimesichard J. Renae GlossWieting, MD, <Dictator> Dr. Sheralyn BoatmanHawkins  Jumar Greenstreet J Markan Cazarez MD ELECTRONICALLY SIGNED 03/04/2014 18:37

## 2015-01-17 NOTE — Discharge Summary (Signed)
Dates of Admission and Diagnosis:  Date of Admission 04-Mar-2014   Date of Discharge 07-Mar-2014   Admitting Diagnosis bronchitis   Final Diagnosis Asthmatic bronchitis Htn DM    Chief Complaint/History of Present Illness a 74 year old female with history of asthma and bronchitis. She presents with a flare-up. Yesterday, she started developing shortness of breath. Last night got worse. She decided to come in prior to things getting worse. She felt a tightness in the chest and a rattling in the chest. She does have a headache. She has been coughing up some clear phlegm. In the ER, she had a persistent wheezing after nebulizer treatments, and hospitalist services were contacted for further evaluation.   Allergies:  Shellfish: Anaphylaxis  Hepatic:  09-Jun-15 11:42   Bilirubin, Total 0.3  Alkaline Phosphatase 67 (45-117 NOTE: New Reference Range 08/16/13)  SGPT (ALT) 14  SGOT (AST) 21  Total Protein, Serum 7.3  Albumin, Serum 3.8  Routine Chem:  09-Jun-15 11:42   Glucose, Serum  172  BUN  22  Creatinine (comp)  1.39  Sodium, Serum 141  Potassium, Serum 3.6  Chloride, Serum 104  CO2, Serum 31  Calcium (Total), Serum 9.4  Anion Gap  6  Osmolality (calc) 289  eGFR (African American)  43  eGFR (Non-African American)  37 (eGFR values <62m/min/1.73 m2 may be an indication of chronic kidney disease (CKD). Calculated eGFR is useful in patients with stable renal function. The eGFR calculation will not be reliable in acutely ill patients when serum creatinine is changing rapidly. It is not useful in  patients on dialysis. The eGFR calculation may not be applicable to patients at the low and high extremes of body sizes, pregnant women, and vegetarians.)  Hemoglobin A1c ([facility])  7.1 (The American Diabetes Association recommends that a primary goal of therapy should be <7% and that physicians should reevaluate the treatment regimen in patients with HbA1c values consistently >8%.)   Cardiac:  09-Jun-15 11:42   Troponin I < 0.02 (0.00-0.05 0.05 ng/mL or less: NEGATIVE  Repeat testing in 3-6 hrs  if clinically indicated. >0.05 ng/mL: POTENTIAL  MYOCARDIAL INJURY. Repeat  testing in 3-6 hrs if  clinically indicated. NOTE: An increase or decrease  of 30% or more on serial  testing suggests a  clinically important change)  Routine Hem:  09-Jun-15 11:42   WBC (CBC) 8.5  RBC (CBC) 3.86  Hemoglobin (CBC)  11.4  Hematocrit (CBC) 36.2  Platelet Count (CBC) 173 (Result(s) reported on 04 Mar 2014 at 12:01PM.)  MCV 94  MCH 29.4  MCHC  31.4  RDW 14.3   PERTINENT RADIOLOGY STUDIES: XRay:    09-Jun-15 11:00, Chest PA and Lateral  Chest PA and Lateral   REASON FOR EXAM:    Shortness of Breath  COMMENTS:   May transport without cardiac monitor    PROCEDURE: DXR - DXR CHEST PA (OR AP) AND LATERAL  - Mar 04 2014 11:00AM     CLINICAL DATA:  Shortness of breath    EXAM:  CHEST  2 VIEW    COMPARISON:  11/22/2011    FINDINGS:  Cardiomediastinal silhouette is stable. There is dextroscoliosis of  thoracic spine and levoscoliosis of the lumbar spine. Mild  degenerative changes thoracic spine. No acute infiltrate or  pulmonary edema. Mild left basilar atelectasis.     IMPRESSION:  No active disease. S-shaped thoracolumbar scoliosis. Mild left  basilar atelectasis or scarring.      Electronically Signed    By: LLahoma Crocker  M.D.    On: 03/04/2014 11:01         Verified By: Ephraim Hamburger, M.D.,   Pertinent Past History:  Pertinent Past History Hypertension, borderline diabetes, diverticulitis, asthma   Hospital Course:  Hospital Course 1. Asthmatic bronchitis with persistent wheeze: IV Solu-Medrol 60 mg IV q.6 hours. Zithromax orally, nebulizer treatments. Continue Qvar equivalent  Flovent while here.    arranged for neb at home. feels much better- swich steropid to oral. discharge home. 2. Hypertension which is accelerated on admission, likely secondary to  difficulty breathing. Continue usual medications - stable now. 3. Borderline diabetes on metformin. Sugars will probably be high while on steroids. on sliding scale. around 150-200. 4. Obesity, with a body mass index of 32.0. Weight loss is recommended.  5. Slight dehydration. Decrease Lasix from 80 mg down to 40.   Condition on Discharge Stable   Code Status:  Code Status Full Code   DISCHARGE INSTRUCTIONS HOME MEDS:  Medication Reconciliation: Patient's Home Medications at Discharge:     Medication Instructions  losartan 100 mg oral tablet  1 tab(s) orally once a day   metoprolol tartrate 25 mg oral tablet  1 tab(s) orally 2 times a day   metformin extended release 500 mg oral tablet, extended release  2 tab(s) orally 2 times a day   omeprazole 20 mg oral delayed release capsule  1 cap(s) orally 2 times a day   aspirin 81 mg oral delayed release tablet  1 tab(s) orally once a day   lansets   1 use as directed for Blood glucose checks 4 times / day   qvar 40 mcg/inh inhalation aerosol  2 puff(s) inhaled 2 times a day   flonase 50 mcg/inh nasal spray  1 spray(s) nasal once a day   loratadine 10 mg oral tablet  1 tab(s) orally once a day   furosemide 40 mg oral tablet  1 tab(s) orally once a day   prednisone 10 mg oral tablet  Start at 60 mg and taper by 10 mg daily until complete   azithromycin 250 mg oral tablet  1 tab(s) orally once a day x 3 days   albuterol-ipratropium 2.5 mg-0.5 mg/3 ml inhalation solution  3 milliliter(s) inhaled 3 times a day     Physician's Instructions:  Home Health? Yes   Home Health Service Physicial Therapy  Nurse   Diet Low Sodium  Carbohydrate Controlled (ADA) Diet   Activity Limitations None   Return to Work Not Applicable   Time frame for Follow Up Appointment 1-2 weeks  PMD     Luan Pulling, James(Family Physician): [facility], [address] [place], [place] [address], [place] [phone]  Electronic Signatures: Vaughan Basta (MD)   (Signed 15-Jun-15 16:09)  Authored: ADMISSION DATE AND DIAGNOSIS, CHIEF COMPLAINT/HPI, Allergies, PERTINENT LABS, PERTINENT RADIOLOGY STUDIES, PERTINENT PAST HISTORY, HOSPITAL COURSE, [place], PATIENT INSTRUCTIONS, Follow Up Physician   Last Updated: 15-Jun-15 16:09 by Vaughan Basta (MD)

## 2015-03-17 ENCOUNTER — Other Ambulatory Visit: Payer: Self-pay | Admitting: Family Medicine

## 2015-04-14 ENCOUNTER — Other Ambulatory Visit: Payer: Self-pay | Admitting: Family Medicine

## 2015-05-01 DIAGNOSIS — J4540 Moderate persistent asthma, uncomplicated: Secondary | ICD-10-CM | POA: Diagnosis not present

## 2015-05-01 DIAGNOSIS — J449 Chronic obstructive pulmonary disease, unspecified: Secondary | ICD-10-CM | POA: Diagnosis not present

## 2015-05-04 ENCOUNTER — Encounter: Payer: Self-pay | Admitting: *Deleted

## 2015-05-04 ENCOUNTER — Encounter: Payer: Self-pay | Admitting: Family Medicine

## 2015-05-05 ENCOUNTER — Ambulatory Visit (INDEPENDENT_AMBULATORY_CARE_PROVIDER_SITE_OTHER): Payer: Commercial Managed Care - HMO | Admitting: Family Medicine

## 2015-05-05 ENCOUNTER — Encounter: Payer: Self-pay | Admitting: Family Medicine

## 2015-05-05 VITALS — BP 136/85 | HR 79 | Temp 98.5°F | Resp 16 | Ht 66.0 in | Wt 206.6 lb

## 2015-05-05 DIAGNOSIS — I10 Essential (primary) hypertension: Secondary | ICD-10-CM

## 2015-05-05 DIAGNOSIS — E119 Type 2 diabetes mellitus without complications: Secondary | ICD-10-CM

## 2015-05-05 DIAGNOSIS — J431 Panlobular emphysema: Secondary | ICD-10-CM | POA: Diagnosis not present

## 2015-05-05 DIAGNOSIS — J302 Other seasonal allergic rhinitis: Secondary | ICD-10-CM | POA: Diagnosis not present

## 2015-05-05 DIAGNOSIS — K219 Gastro-esophageal reflux disease without esophagitis: Secondary | ICD-10-CM

## 2015-05-05 DIAGNOSIS — E1121 Type 2 diabetes mellitus with diabetic nephropathy: Secondary | ICD-10-CM | POA: Insufficient documentation

## 2015-05-05 DIAGNOSIS — N183 Chronic kidney disease, stage 3 (moderate): Secondary | ICD-10-CM

## 2015-05-05 DIAGNOSIS — I129 Hypertensive chronic kidney disease with stage 1 through stage 4 chronic kidney disease, or unspecified chronic kidney disease: Secondary | ICD-10-CM | POA: Insufficient documentation

## 2015-05-05 LAB — POCT GLYCOSYLATED HEMOGLOBIN (HGB A1C): HEMOGLOBIN A1C: 6.5

## 2015-05-05 MED ORDER — LOSARTAN POTASSIUM 100 MG PO TABS: 100.0000 mg | ORAL_TABLET | Freq: Every day | ORAL | Status: DC

## 2015-05-05 MED ORDER — FLUTICASONE PROPIONATE 50 MCG/ACT NA SUSP: 1.0000 | Freq: Every day | NASAL | Status: DC

## 2015-05-05 MED ORDER — OMEPRAZOLE 20 MG PO CPDR: 20.0000 mg | DELAYED_RELEASE_CAPSULE | Freq: Every day | ORAL | Status: DC

## 2015-05-05 MED ORDER — METFORMIN HCL 500 MG PO TABS: ORAL_TABLET | ORAL | Status: DC

## 2015-05-05 MED ORDER — FUROSEMIDE 40 MG PO TABS: 80.0000 mg | ORAL_TABLET | Freq: Every day | ORAL | Status: DC

## 2015-05-05 NOTE — Progress Notes (Signed)
Name: Sharon Henderson   MRN: [id number]    DOB: 19-Aug-1941   Date:05/05/2015       Progress Note  Subjective  Chief Complaint  Chief Complaint  Patient presents with  . Hypertension  . Diabetes    6.5 A1C 10/2013 - BS not regulated.    HPI  For f/u of DM and HBP.  Sees Pulmonologist in [place] for COPD.  Feeling well overall.  Losing weight.  Not checking BSs at home.  Swelling doing well overall.   Past Medical History  Diagnosis Date  . Acute respiratory failure     secondary to asthma  . Asthma   . Hypertension   . Diabetes     type 2  . Allergic rhinitis   . Hyperlipidemia   . Diverticulosis   . Internal hemorrhoids     History  Substance Use Topics  . Smoking status: Never Smoker   . Smokeless tobacco: Never Used  . Alcohol Use: No     Current outpatient prescriptions:  .  acetaminophen (TYLENOL) 500 MG tablet, Take 1,000 mg by mouth every 8 (eight) hours as needed., Disp: , Rfl:  .  albuterol (PROAIR HFA) 108 (90 BASE) MCG/ACT inhaler, Inhale into the lungs., Disp: , Rfl:  .  aspirin EC 81 MG tablet, Take by mouth., Disp: , Rfl:  .  beclomethasone (QVAR) 40 MCG/ACT inhaler, Inhale 2 puffs into the lungs 2 (two) times daily., Disp: , Rfl:  .  budesonide-formoterol (SYMBICORT) 160-4.5 MCG/ACT inhaler, Inhale into the lungs., Disp: , Rfl:  .  cetirizine (ZYRTEC) 10 MG tablet, Take 10 mg by mouth daily., Disp: , Rfl:  .  diphenhydrAMINE (BENADRYL) 25 MG tablet, Take 25 mg by mouth every 6 (six) hours as needed., Disp: , Rfl:  .  famotidine (PEPCID) 20 MG tablet, Take 20 mg by mouth 2 (two) times daily., Disp: , Rfl:  .  fluticasone (FLONASE) 50 MCG/ACT nasal spray, Place 1-2 sprays into both nostrils daily., Disp: , Rfl:  .  furosemide (LASIX) 40 MG tablet, Take 80 mg by mouth daily., Disp: , Rfl:  .  ibuprofen (ADVIL,MOTRIN) 200 MG tablet, Take by mouth., Disp: , Rfl:  .  ipratropium-albuterol (DUONEB) 0.5-2.5 (3) MG/3ML SOLN, INHALE CONTENTS OF 1 VIAL VIA NEBULIZER  THREE TIMES A DAY, Disp: , Rfl:  .  losartan (COZAAR) 100 MG tablet, Take 100 mg by mouth daily., Disp: , Rfl:  .  metFORMIN (GLUCOPHAGE) 500 MG tablet, Take by mouth., Disp: , Rfl:  .  metoprolol succinate (TOPROL-XL) 25 MG 24 hr tablet, Take by mouth., Disp: , Rfl:  .  montelukast (SINGULAIR) 10 MG tablet, Take by mouth., Disp: , Rfl:  .  omeprazole (PRILOSEC) 20 MG capsule, Take by mouth., Disp: , Rfl:  .  tiotropium (SPIRIVA) 18 MCG inhalation capsule, Place into inhaler and inhale., Disp: , Rfl:   Allergies  Allergen Reactions  . Shellfish Allergy Other (See Comments)  . Mold Extract [Trichophyton] Other (See Comments)    Review of Systems  Constitutional: Positive for weight loss. Negative for fever, chills and malaise/fatigue.  HENT: Negative for hearing loss.   Eyes: Negative for blurred vision and double vision.  Respiratory: Positive for shortness of breath (m ild). Negative for cough, sputum production and wheezing.   Cardiovascular: Positive for leg swelling (better). Negative for chest pain, palpitations and orthopnea.  Gastrointestinal: Negative for heartburn, nausea, vomiting, abdominal pain, diarrhea and blood in stool.  Genitourinary: Negative for dysuria, urgency and frequency.  Musculoskeletal: Negative for myalgias and joint pain.  Skin: Negative for rash.  Neurological: Negative for dizziness, tremors, sensory change, focal weakness, weakness and headaches.  Psychiatric/Behavioral: Negative for depression. The patient is not nervous/anxious.       Objective  Filed Vitals:   05/05/15 0831  BP: 136/85  Pulse: 79  Temp: 98.5 F (36.9 C)  Resp: 16  Height:  (1.676 m)  Weight: 206 lb 9.6 oz (93.713 kg)     Physical Exam  Constitutional: She is oriented to person, place, and time and well-developed, well-nourished, and in no distress.  HENT:  Head: Normocephalic and atraumatic.  Eyes: Conjunctivae and EOM are normal. Pupils are equal, round, and  reactive to light. No scleral icterus.  Neck: Normal range of motion. Neck supple. No thyromegaly present.  Cardiovascular: Normal rate, regular rhythm, normal heart sounds and intact distal pulses.  Exam reveals no gallop and no friction rub.   No murmur heard. Pulmonary/Chest: Effort normal and breath sounds normal. No respiratory distress. She has no wheezes. She has no rales.  Abdominal: Soft. Bowel sounds are normal. She exhibits no distension and no mass. There is no tenderness.  Musculoskeletal: She exhibits edema (trrace bilateral pedal edema).  Lymphadenopathy:    She has no cervical adenopathy.  Neurological: She is alert and oriented to person, place, and time.  Vitals reviewed.   No results found for this or any previous visit (from the past 2160 hour(s)).   Assessment & Plan  1. Type 2 diabetes mellitus without complication  - POCT HgB A1C -6.5 - metFORMIN (GLUCOPHAGE) 500 MG tablet; Take 2 tablets orally, twice a day.  Dispense: 120 tablet; Refill: 12  2. Essential hypertension  - furosemide (LASIX) 40 MG tablet; Take 2 tablets (80 mg total) by mouth daily.  Dispense: 60 tablet; Refill: 12 - losartan (COZAAR) 100 MG tablet; Take 1 tablet (100 mg total) by mouth daily.  Dispense: 30 tablet; Refill: 12  3. Panlobular emphysema -cont. To see Pulmol. at [facility].  4. Gastroesophageal reflux disease without esophagitis  - omeprazole (PRILOSEC) 20 MG capsule; Take 1 capsule (20 mg total) by mouth daily.  Dispense: 30 capsule; Refill: 12  5. Seasonal allergies  - fluticasone (FLONASE) 50 MCG/ACT nasal spray; Place 1-2 sprays into both nostrils daily.  Dispense: 16 g; Refill: 12

## 2015-05-05 NOTE — Patient Instructions (Signed)
Cont. Her meds and and see Pulmonology specialists at [facility].

## 2015-05-07 ENCOUNTER — Other Ambulatory Visit: Payer: Self-pay | Admitting: Family Medicine

## 2015-05-19 DIAGNOSIS — E119 Type 2 diabetes mellitus without complications: Secondary | ICD-10-CM | POA: Diagnosis not present

## 2015-05-19 DIAGNOSIS — K219 Gastro-esophageal reflux disease without esophagitis: Secondary | ICD-10-CM | POA: Diagnosis not present

## 2015-05-20 LAB — COMPREHENSIVE METABOLIC PANEL
Albumin/Globulin Ratio: 1.8 (ref 1.1–2.5)
Globulin, Total: 2.4 g/dL (ref 1.5–4.5)
Total Protein: 6.8 g/dL (ref 6.0–8.5)

## 2015-05-20 LAB — CBC WITH DIFFERENTIAL/PLATELET
BASOS: 1 %
Basophils Absolute: 0.1 10*3/uL (ref 0.0–0.2)
EOS (ABSOLUTE): 0.4 10*3/uL (ref 0.0–0.4)
Eos: 5 %
HEMOGLOBIN: 11.2 g/dL (ref 11.1–15.9)
Hematocrit: 34.5 % (ref 34.0–46.6)
IMMATURE GRANS (ABS): 0 10*3/uL (ref 0.0–0.1)
IMMATURE GRANULOCYTES: 0 %
LYMPHS: 25 %
Lymphocytes Absolute: 1.9 10*3/uL (ref 0.7–3.1)
MCH: 29.2 pg (ref 26.6–33.0)
MCHC: 32.5 g/dL (ref 31.5–35.7)
MCV: 90 fL (ref 79–97)
MONOCYTES: 5 %
Monocytes Absolute: 0.3 10*3/uL (ref 0.1–0.9)
NEUTROS ABS: 4.9 10*3/uL (ref 1.4–7.0)
Neutrophils: 64 %
PLATELETS: 243 10*3/uL (ref 150–379)
RBC: 3.84 x10E6/uL (ref 3.77–5.28)
RDW: 13.9 % (ref 12.3–15.4)
WBC: 7.6 10*3/uL (ref 3.4–10.8)

## 2015-05-20 LAB — LIPID PANEL
CHOLESTEROL TOTAL: 224 mg/dL — AB (ref 100–199)
Chol/HDL Ratio: 3.3 ratio units (ref 0.0–4.4)
HDL: 68 mg/dL (ref >39)
LDL CALC: 140 mg/dL — AB (ref 0–99)
TRIGLYCERIDES: 81 mg/dL (ref 0–149)
VLDL Cholesterol Cal: 16 mg/dL (ref 5–40)

## 2015-05-25 NOTE — Progress Notes (Signed)
Advised we will send labs when CMP is in and will not call unless its abnormal.JH

## 2015-05-25 NOTE — Progress Notes (Signed)
Advised and will mail CMP if normal.JH

## 2015-05-26 ENCOUNTER — Telehealth: Payer: Self-pay | Admitting: Family Medicine

## 2015-05-26 NOTE — Telephone Encounter (Signed)
Pt called states that have  appt at [facility]  On  Sept 1 need a referral appt time 3:30

## 2015-05-26 NOTE — Telephone Encounter (Signed)
Got approval # is [id number].

## 2015-05-28 DIAGNOSIS — H4011X3 Primary open-angle glaucoma, severe stage: Secondary | ICD-10-CM | POA: Diagnosis not present

## 2015-06-11 ENCOUNTER — Other Ambulatory Visit: Payer: Self-pay | Admitting: Family Medicine

## 2015-06-12 ENCOUNTER — Telehealth: Payer: Self-pay

## 2015-06-12 ENCOUNTER — Other Ambulatory Visit: Payer: Self-pay | Admitting: Family Medicine

## 2015-06-12 MED ORDER — LORAZEPAM 0.5 MG PO TABS: ORAL_TABLET | ORAL | Status: DC

## 2015-06-12 NOTE — Telephone Encounter (Signed)
Patient had to pull plug on sons life support after Diabetic Coma. Sister called and I heard patient in back crying hard. They requested mild sedative to help calm her down. As per Dr.Hawkins he gave Ativan and they will pik up. When called back the sister informed me that she has been using inhaler but that was due to increased anxiety. Will advise ER if this worsens. [facility]

## 2015-06-22 DIAGNOSIS — H26492 Other secondary cataract, left eye: Secondary | ICD-10-CM | POA: Diagnosis not present

## 2015-06-23 ENCOUNTER — Telehealth: Payer: Self-pay

## 2015-06-23 NOTE — Telephone Encounter (Signed)
Patient called to inquire about Pulmonary Excersize classes. [phone]

## 2015-06-23 NOTE — Telephone Encounter (Signed)
Yes, pulmonologist should do order for this.-jh

## 2015-06-23 NOTE — Telephone Encounter (Signed)
We will get [employer] Referral to [facility]. Patient's pulmonary ([facility]) should place referral right?

## 2015-06-24 ENCOUNTER — Telehealth: Payer: Self-pay | Admitting: *Deleted

## 2015-06-24 NOTE — Telephone Encounter (Signed)
Authorization valid from 06/24/2015-12/21/2015.

## 2015-06-24 NOTE — Telephone Encounter (Signed)
Patient called and requested a referral be entered for Pulmonary Rehab. Patient was informed that we would obtain Authorization from [facility]. Patient needs to call [facility] to have the enter actual referral. This was discussed with Dr. Juanetta Gosling and he agreed. Auth # [id number].

## 2015-06-25 NOTE — Telephone Encounter (Signed)
Called patient and her sister Dr. Amada Kingfisher is her pulmonlologist.  Called [facility] and Dr. Ashley Royalty only works in that off on Fridays. Left a message that patient has requested a referral to [facility] for pulmonary rehab. I've tried to enter [facility] referral but NPI [id number] is not recognized. Awaiting a call back from [facility] for more info.

## 2015-07-09 ENCOUNTER — Other Ambulatory Visit: Payer: Self-pay | Admitting: Family Medicine

## 2015-07-19 ENCOUNTER — Other Ambulatory Visit: Payer: Self-pay | Admitting: Family Medicine

## 2015-08-11 ENCOUNTER — Telehealth: Payer: Self-pay | Admitting: Family Medicine

## 2015-08-11 NOTE — Telephone Encounter (Signed)
Pt. Return your call pt.

## 2015-08-11 NOTE — Telephone Encounter (Signed)
Appt reminder 11/18 @ 8:30

## 2015-08-14 ENCOUNTER — Ambulatory Visit (INDEPENDENT_AMBULATORY_CARE_PROVIDER_SITE_OTHER): Payer: Commercial Managed Care - HMO | Admitting: Family Medicine

## 2015-08-14 ENCOUNTER — Encounter: Payer: Self-pay | Admitting: Family Medicine

## 2015-08-14 VITALS — BP 185/100 | HR 78 | Temp 98.3°F | Resp 16 | Ht 66.0 in | Wt 213.0 lb

## 2015-08-14 DIAGNOSIS — J431 Panlobular emphysema: Secondary | ICD-10-CM | POA: Diagnosis not present

## 2015-08-14 DIAGNOSIS — I10 Essential (primary) hypertension: Secondary | ICD-10-CM

## 2015-08-14 DIAGNOSIS — E119 Type 2 diabetes mellitus without complications: Secondary | ICD-10-CM

## 2015-08-14 DIAGNOSIS — Z23 Encounter for immunization: Secondary | ICD-10-CM | POA: Diagnosis not present

## 2015-08-14 LAB — POCT GLYCOSYLATED HEMOGLOBIN (HGB A1C): Hemoglobin A1C: 6.4

## 2015-08-14 MED ORDER — METOPROLOL TARTRATE 50 MG PO TABS: ORAL_TABLET | ORAL | Status: DC

## 2015-08-14 NOTE — Patient Instructions (Signed)
Take 2, 25 mg Metoprolol tartrate tabs twice a day until bottle empty.

## 2015-08-14 NOTE — Progress Notes (Signed)
Name: Sharon Henderson   MRN: [id number]    DOB: [birth year]   Date:08/14/2015       Progress Note  Subjective  Chief Complaint  Chief Complaint  Patient presents with  . Diabetes  . Hypertension    HPI Here for f/u of DM and HBP.  Also has COPD and nasal allergies and GERD.  Overall doing well .  BSs at home in 120-130 range when checked.  Has not taken her BP meds yet this AM No problem-specific assessment & plan notes found for this encounter.   Past Medical History  Diagnosis Date  . Acute respiratory failure (HCC)     secondary to asthma  . Asthma   . Hypertension   . Diabetes (HCC)     type 2  . Allergic rhinitis   . Hyperlipidemia   . Diverticulosis   . Internal hemorrhoids     Past Surgical History  Procedure Laterality Date  . Colonoscopy    . Cataract extraction      Family History  Problem Relation Age of Onset  . Asthma Mother     Social History   Social History  . Marital Status: Widowed    Spouse Name: N/A  . Number of Children: N/A  . Years of Education: N/A   Occupational History  . Not on file.   Social History Main Topics  . Smoking status: Never Smoker   . Smokeless tobacco: Never Used  . Alcohol Use: No  . Drug Use: No  . Sexual Activity: Not on file   Other Topics Concern  . Not on file   Social History Narrative     Current outpatient prescriptions:  .  acetaminophen (TYLENOL) 500 MG tablet, Take 1,000 mg by mouth every 8 (eight) hours as needed., Disp: , Rfl:  .  aspirin EC 81 MG tablet, Take by mouth., Disp: , Rfl:  .  budesonide-formoterol (SYMBICORT) 160-4.5 MCG/ACT inhaler, Inhale into the lungs., Disp: , Rfl:  .  cetirizine (ZYRTEC) 10 MG tablet, Take 10 mg by mouth daily., Disp: , Rfl:  .  diphenhydrAMINE (BENADRYL) 25 MG tablet, Take 25 mg by mouth every 6 (six) hours as needed., Disp: , Rfl:  .  fluticasone (FLONASE) 50 MCG/ACT nasal spray, Place 1-2 sprays into both nostrils daily., Disp: 16 g, Rfl: 12 .   furosemide (LASIX) 40 MG tablet, Take 2 tablets (80 mg total) by mouth daily. (Patient taking differently: Take 80 mg by mouth daily. Once daily), Disp: 60 tablet, Rfl: 12 .  ibuprofen (ADVIL,MOTRIN) 200 MG tablet, Take by mouth., Disp: , Rfl:  .  ipratropium-albuterol (DUONEB) 0.5-2.5 (3) MG/3ML SOLN, INHALE CONTENTS OF 1 VIAL VIA NEBULIZER THREE TIMES A DAY, Disp: , Rfl:  .  latanoprost (XALATAN) 0.005 % ophthalmic solution, TAKE 1 DROP(S) IN BOTH EYES ONCE IN THE EVENING, Disp: , Rfl: 5 .  losartan (COZAAR) 100 MG tablet, Take 1 tablet (100 mg total) by mouth daily., Disp: 30 tablet, Rfl: 12 .  metFORMIN (GLUCOPHAGE-XR) 500 MG 24 hr tablet, TAKE 2 TABLETS BY MOUTH TWICE A DAY, Disp: 120 tablet, Rfl: 3 .  metoprolol tartrate (LOPRESSOR) 50 MG tablet, Take 1 tablet twice a day, Disp: 180 tablet, Rfl: 3 .  montelukast (SINGULAIR) 10 MG tablet, Take by mouth., Disp: , Rfl:  .  omeprazole (PRILOSEC) 20 MG capsule, TAKE ONE CAPSULE BY MOUTH TWICE A DAY, Disp: 180 capsule, Rfl: 6 .  tiotropium (SPIRIVA) 18 MCG inhalation capsule, Place into inhaler and  inhale., Disp: , Rfl:  .  beclomethasone (QVAR) 40 MCG/ACT inhaler, Inhale 2 puffs into the lungs 2 (two) times daily., Disp: , Rfl:  .  VENTOLIN HFA 108 (90 BASE) MCG/ACT inhaler, Inhale 2 puffs into the lungs as needed., Disp: , Rfl: 12  Allergies  Allergen Reactions  . Shellfish Allergy Other (See Comments)  . Mold Extract [Trichophyton] Other (See Comments)     Review of Systems  Constitutional: Negative for fever, chills, weight loss and malaise/fatigue.  HENT: Negative for hearing loss.   Eyes: Negative for blurred vision and double vision.  Respiratory: Negative for cough, sputum production, shortness of breath and wheezing.   Cardiovascular: Negative for chest pain, palpitations and leg swelling.  Gastrointestinal: Negative for heartburn, abdominal pain and blood in stool.  Genitourinary: Negative for dysuria, urgency and frequency.   Musculoskeletal: Negative for myalgias and joint pain.  Skin: Negative for rash.  Neurological: Negative for dizziness, tremors, weakness and headaches.      Objective  Filed Vitals:   08/14/15 0830 08/14/15 0913  BP: 178/98 185/100  Pulse: 70 78  Temp: 98.3 F (36.8 C)   TempSrc: Oral   Resp: 16   Height:  (1.676 m)   Weight: 213 lb (96.616 kg)     Physical Exam  Constitutional: She is oriented to person, place, and time and well-developed, well-nourished, and in no distress. No distress.  HENT:  Head: Normocephalic and atraumatic.  Eyes: Conjunctivae and EOM are normal. Pupils are equal, round, and reactive to light. No scleral icterus.  Neck: Normal range of motion. Carotid bruit is not present. No thyromegaly present.  Cardiovascular: Normal rate, regular rhythm, normal heart sounds and intact distal pulses.  Exam reveals no gallop and no friction rub.   No murmur heard. Pulmonary/Chest: Effort normal and breath sounds normal. No respiratory distress. She has no wheezes. She has no rales.  Abdominal: Soft. Bowel sounds are normal. She exhibits no distension, no abdominal bruit and no mass. There is no tenderness.  Musculoskeletal: She exhibits edema (trace bilateral pedal edema).  Lymphadenopathy:    She has no cervical adenopathy.  Neurological: She is alert and oriented to person, place, and time.  Vitals reviewed.      Recent Results (from the past 2160 hour(s))  Lipid Profile     Status: Abnormal   Collection Time: 05/19/15 11:26 AM  Result Value Ref Range   Cholesterol, Total 224 (H) 100 - 199 mg/dL   Triglycerides 81 0 - 149 mg/dL   HDL 68 >96 mg/dL    Comment: According to ATP-III Guidelines, HDL-C >59 mg/dL is considered a negative risk factor for CHD.    VLDL Cholesterol Cal 16 5 - 40 mg/dL   LDL Calculated 045 (H) 0 - 99 mg/dL   Chol/HDL Ratio 3.3 0.0 - 4.4 ratio units    Comment:                                   T. Chol/HDL Ratio                                              Men  Women  1/2 Avg.Risk  3.4    3.3                                   Avg.Risk  5.0    4.4                                2X Avg.Risk  9.6    7.1                                3X Avg.Risk 23.4   11.0   Comprehensive Metabolic Panel (CMET)     Status: None   Collection Time: 05/19/15 11:26 AM  Result Value Ref Range   Total Protein 6.8 6.0 - 8.5 g/dL   Globulin, Total 2.4 1.5 - 4.5 g/dL   Albumin/Globulin Ratio 1.8 1.1 - 2.5  CBC with Differential     Status: None   Collection Time: 05/19/15 11:26 AM  Result Value Ref Range   WBC 7.6 3.4 - 10.8 x10E3/uL   RBC 3.84 3.77 - 5.28 x10E6/uL   Hemoglobin 11.2 11.1 - 15.9 g/dL   Hematocrit 16.134.5 09.634.0 - 46.6 %   MCV 90 79 - 97 fL   MCH 29.2 26.6 - 33.0 pg   MCHC 32.5 31.5 - 35.7 g/dL   RDW 04.513.9 40.912.3 - 81.115.4 %   Platelets 243 150 - 379 x10E3/uL   Neutrophils 64 %   Lymphs 25 %   Monocytes 5 %   Eos 5 %   Basos 1 %   Neutrophils Absolute 4.9 1.4 - 7.0 x10E3/uL   Lymphocytes Absolute 1.9 0.7 - 3.1 x10E3/uL   Monocytes Absolute 0.3 0.1 - 0.9 x10E3/uL   EOS (ABSOLUTE) 0.4 0.0 - 0.4 x10E3/uL   Basophils Absolute 0.1 0.0 - 0.2 x10E3/uL   Immature Granulocytes 0 %   Immature Grans (Abs) 0.0 0.0 - 0.1 x10E3/uL  POCT HgB A1C     Status: Normal   Collection Time: 08/14/15  8:45 AM  Result Value Ref Range   Hemoglobin A1C 6.4 %      Assessment & Plan  Problem List Items Addressed This Visit      Cardiovascular and Mediastinum   HBP (high blood pressure)   Relevant Medications   metoprolol tartrate (LOPRESSOR) 50 MG tablet     Respiratory   COPD (chronic obstructive pulmonary disease) (HCC)   Relevant Medications   VENTOLIN HFA 108 (90 BASE) MCG/ACT inhaler     Endocrine   Diabetes (HCC) - Primary   Relevant Orders   POCT HgB A1C (Completed)    Other Visit Diagnoses    Need for influenza vaccination        Relevant Orders    Flu vaccine HIGH DOSE PF (Fluzone High  dose)       Meds ordered this encounter  Medications  . VENTOLIN HFA 108 (90 BASE) MCG/ACT inhaler    Sig: Inhale 2 puffs into the lungs as needed.    Refill:  12  . metoprolol tartrate (LOPRESSOR) 50 MG tablet    Sig: Take 1 tablet twice a day    Dispense:  180 tablet    Refill:  3   1. Type 2 diabetes mellitus without complication, without long-term current use of insulin (HCC) -cont. Metformin at current dose - POCT HgB  A1C -6.4  2. Need for influenza vaccination  - Flu vaccine HIGH DOSE PF (Fluzone High dose)  3. Essential hypertension -continue Losartan at current dose. - metoprolol tartrate (LOPRESSOR) 50 MG tablet; Take 1 tablet twice a day  Dispense: 180 tablet; Refill: 3 (increased from 25 mg). 4. Panlobular emphysema (HCC) -cont. All inhalers.

## 2015-08-15 ENCOUNTER — Other Ambulatory Visit: Payer: Self-pay | Admitting: Family Medicine

## 2015-09-01 ENCOUNTER — Other Ambulatory Visit: Payer: Self-pay | Admitting: Family Medicine

## 2015-09-14 ENCOUNTER — Other Ambulatory Visit: Payer: Self-pay | Admitting: Family Medicine

## 2015-09-15 ENCOUNTER — Encounter: Payer: Self-pay | Admitting: Family Medicine

## 2015-09-15 ENCOUNTER — Ambulatory Visit (INDEPENDENT_AMBULATORY_CARE_PROVIDER_SITE_OTHER): Payer: Commercial Managed Care - HMO | Admitting: Family Medicine

## 2015-09-15 VITALS — BP 180/100 | HR 61 | Temp 97.7°F | Resp 16 | Ht 66.0 in | Wt 212.0 lb

## 2015-09-15 DIAGNOSIS — I10 Essential (primary) hypertension: Secondary | ICD-10-CM

## 2015-09-15 DIAGNOSIS — E119 Type 2 diabetes mellitus without complications: Secondary | ICD-10-CM | POA: Diagnosis not present

## 2015-09-15 DIAGNOSIS — J431 Panlobular emphysema: Secondary | ICD-10-CM

## 2015-09-15 MED ORDER — AMLODIPINE BESYLATE 10 MG PO TABS: 10.0000 mg | ORAL_TABLET | Freq: Every day | ORAL | Status: DC

## 2015-09-15 NOTE — Progress Notes (Signed)
Name: Sharon Henderson   MRN: [id number]    DOB: 03-18-1941   Date:09/15/2015       Progress Note  Subjective  Chief Complaint  Chief Complaint  Patient presents with  . Hypertension    HPI  Here for f/u of HBP.  BPs very high.   Also diabetic with BSs at home in 130-150 range.  Also has COPD and is using inhalers a lot, and taking her routine meds.  Sees Pulmonary specialist at [facility] and has appt. With her in 2 weeks. No problem-specific assessment & plan notes found for this encounter.   Past Medical History  Diagnosis Date  . Acute respiratory failure (HCC)     secondary to asthma  . Asthma   . Hypertension   . Diabetes (HCC)     type 2  . Allergic rhinitis   . Hyperlipidemia   . Diverticulosis   . Internal hemorrhoids     Past Surgical History  Procedure Laterality Date  . Colonoscopy    . Cataract extraction      Family History  Problem Relation Age of Onset  . Asthma Mother     Social History   Social History  . Marital Status: Widowed    Spouse Name: N/A  . Number of Children: N/A  . Years of Education: N/A   Occupational History  . Not on file.   Social History Main Topics  . Smoking status: Never Smoker   . Smokeless tobacco: Never Used  . Alcohol Use: No  . Drug Use: No  . Sexual Activity: Not on file   Other Topics Concern  . Not on file   Social History Narrative     Current outpatient prescriptions:  .  acetaminophen (TYLENOL) 500 MG tablet, Take 1,000 mg by mouth every 8 (eight) hours as needed., Disp: , Rfl:  .  aspirin EC 81 MG tablet, Take by mouth., Disp: , Rfl:  .  beclomethasone (QVAR) 40 MCG/ACT inhaler, Inhale 2 puffs into the lungs 2 (two) times daily., Disp: , Rfl:  .  cetirizine (ZYRTEC) 10 MG tablet, TAKE 1 TABLET (10 MG TOTAL) BY MOUTH ONCE DAILY., Disp: 30 tablet, Rfl: 9 .  diphenhydrAMINE (BENADRYL) 25 MG tablet, Take 25 mg by mouth every 6 (six) hours as needed., Disp: , Rfl:  .  fluticasone (FLONASE) 50 MCG/ACT nasal  spray, Place 1-2 sprays into both nostrils daily., Disp: 16 g, Rfl: 12 .  furosemide (LASIX) 40 MG tablet, Take 2 tablets (80 mg total) by mouth daily. (Patient taking differently: Take 80 mg by mouth daily. Once daily), Disp: 60 tablet, Rfl: 12 .  ibuprofen (ADVIL,MOTRIN) 200 MG tablet, Take by mouth., Disp: , Rfl:  .  ipratropium-albuterol (DUONEB) 0.5-2.5 (3) MG/3ML SOLN, INHALE CONTENTS OF 1 VIAL VIA NEBULIZER THREE TIMES A DAY, Disp: , Rfl:  .  latanoprost (XALATAN) 0.005 % ophthalmic solution, TAKE 1 DROP(S) IN BOTH EYES ONCE IN THE EVENING, Disp: , Rfl: 5 .  losartan (COZAAR) 100 MG tablet, Take 1 tablet (100 mg total) by mouth daily., Disp: 30 tablet, Rfl: 12 .  metFORMIN (GLUCOPHAGE-XR) 500 MG 24 hr tablet, TAKE 2 TABLETS BY MOUTH TWICE A DAY, Disp: 120 tablet, Rfl: 3 .  metoprolol tartrate (LOPRESSOR) 50 MG tablet, Take 1 tablet twice a day, Disp: 180 tablet, Rfl: 3 .  montelukast (SINGULAIR) 10 MG tablet, TAKE 1 TABLET (10 MG TOTAL) BY MOUTH NIGHTLY., Disp: 30 tablet, Rfl: 11 .  omeprazole (PRILOSEC) 20 MG capsule,  TAKE ONE CAPSULE BY MOUTH TWICE A DAY, Disp: 180 capsule, Rfl: 6 .  SYMBICORT 160-4.5 MCG/ACT inhaler, INHALE 2 INHALATIONS INTO THE LUNGS 2 (TWO) TIMES DAILY., Disp: 10 Inhaler, Rfl: 6 .  VENTOLIN HFA 108 (90 BASE) MCG/ACT inhaler, Inhale 2 puffs into the lungs as needed., Disp: , Rfl: 12 .  amLODipine (NORVASC) 10 MG tablet, Take 1 tablet (10 mg total) by mouth daily., Disp: 30 tablet, Rfl: 6 .  tiotropium (SPIRIVA) 18 MCG inhalation capsule, Place into inhaler and inhale., Disp: , Rfl:   Allergies  Allergen Reactions  . Shellfish Allergy Other (See Comments)  . Mold Extract [Trichophyton] Other (See Comments)     Review of Systems  Constitutional: Negative for fever, chills, weight loss and malaise/fatigue.  HENT: Negative for hearing loss.   Eyes: Negative for blurred vision and double vision.  Respiratory: Positive for cough (occ.), shortness of breath and  wheezing.   Cardiovascular: Negative for chest pain, palpitations and leg swelling.  Gastrointestinal: Negative for heartburn, abdominal pain and blood in stool.  Genitourinary: Negative for dysuria, urgency and frequency.  Skin: Negative for rash.  Neurological: Negative for dizziness, tremors, weakness and headaches.      Objective  Filed Vitals:   09/15/15 0832 09/15/15 0854  BP: 207/115 180/100  Pulse: 61   Temp: 97.7 F (36.5 C)   TempSrc: Oral   Resp: 16   Height: 5\' 6"  (1.676 m)   Weight: 212 lb (96.163 kg)     Physical Exam  Constitutional: She is well-developed, well-nourished, and in no distress. No distress.  HENT:  Head: Normocephalic.  Eyes: Conjunctivae and EOM are normal. Pupils are equal, round, and reactive to light. No scleral icterus.  Neck: Normal range of motion. Neck supple. Carotid bruit is not present. No thyromegaly present.  Cardiovascular: Normal rate and normal heart sounds.  Frequent extrasystoles are present. Exam reveals no gallop and no friction rub.   No murmur heard. Pulmonary/Chest: Effort normal and breath sounds normal. No respiratory distress. She has no wheezes. She has no rales.  Musculoskeletal: She exhibits edema (1+ edema of ankles and lower legs bilaterally.).  Lymphadenopathy:    She has no cervical adenopathy.  Vitals reviewed.      Recent Results (from the past 2160 hour(s))  POCT HgB A1C     Status: Normal   Collection Time: 08/14/15  8:45 AM  Result Value Ref Range   Hemoglobin A1C 6.4 %      Assessment & Plan  Problem List Items Addressed This Visit      Cardiovascular and Mediastinum   HBP (high blood pressure) - Primary   Relevant Medications   amLODipine (NORVASC) 10 MG tablet     Respiratory   COPD (chronic obstructive pulmonary disease) (HCC)     Endocrine   Diabetes (HCC)      Meds ordered this encounter  Medications  . amLODipine (NORVASC) 10 MG tablet    Sig: Take 1 tablet (10 mg total) by  mouth daily.    Dispense:  30 tablet    Refill:  6   1. Essential hypertension -cont. Other meds - amLODipine (NORVASC) 10 MG tablet; Take 1 tablet (10 mg total) by mouth daily.  Dispense: 30 tablet; Refill: 6  2. Panlobular emphysema (HCC) -cont. inhalers  3. Type 2 diabetes mellitus without complication, without long-term current use of insulin (HCC) -cont. Med.

## 2015-09-16 ENCOUNTER — Other Ambulatory Visit: Payer: Self-pay | Admitting: Family Medicine

## 2015-09-29 ENCOUNTER — Ambulatory Visit: Payer: Self-pay | Admitting: Family Medicine

## 2015-10-12 ENCOUNTER — Encounter: Payer: Self-pay | Admitting: Family Medicine

## 2015-10-12 ENCOUNTER — Ambulatory Visit (INDEPENDENT_AMBULATORY_CARE_PROVIDER_SITE_OTHER): Payer: Commercial Managed Care - HMO | Admitting: Family Medicine

## 2015-10-12 VITALS — BP 175/90 | HR 76 | Temp 98.8°F | Resp 16 | Ht 66.0 in | Wt 212.0 lb

## 2015-10-12 DIAGNOSIS — I10 Essential (primary) hypertension: Secondary | ICD-10-CM

## 2015-10-12 MED ORDER — METOPROLOL TARTRATE 100 MG PO TABS: ORAL_TABLET | ORAL | Status: DC

## 2015-10-12 NOTE — Progress Notes (Signed)
Name: Sharon Henderson   MRN: [id number]    DOB: [birth year]   Date:10/12/2015       Progress Note  Subjective  Chief Complaint  Chief Complaint  Patient presents with  . Hypertension    Hypertension Pertinent negatives include no blurred vision, chest pain, headaches, malaise/fatigue, palpitations or shortness of breath.   Here for f/u of HBP and DM.  BSs at home 135-140.  BPs not checked at home.  Gets "dizzy" on occasion.  Cn occur suddenly, but not related to any activity or time.  Last A1c was 6.4 08/14/15.  No problem-specific assessment & plan notes found for this encounter.   Past Medical History  Diagnosis Date  . Acute respiratory failure (HCC)     secondary to asthma  . Asthma   . Hypertension   . Diabetes (HCC)     type 2  . Allergic rhinitis   . Hyperlipidemia   . Diverticulosis   . Internal hemorrhoids     Past Surgical History  Procedure Laterality Date  . Colonoscopy    . Cataract extraction      Family History  Problem Relation Age of Onset  . Asthma Mother     Social History   Social History  . Marital Status: Widowed    Spouse Name: N/A  . Number of Children: N/A  . Years of Education: N/A   Occupational History  . Not on file.   Social History Main Topics  . Smoking status: Never Smoker   . Smokeless tobacco: Never Used  . Alcohol Use: No  . Drug Use: No  . Sexual Activity: Not on file   Other Topics Concern  . Not on file   Social History Narrative     Current outpatient prescriptions:  .  acetaminophen (TYLENOL) 500 MG tablet, Take 1,000 mg by mouth every 8 (eight) hours as needed., Disp: , Rfl:  .  amLODipine (NORVASC) 10 MG tablet, Take 1 tablet (10 mg total) by mouth daily., Disp: 30 tablet, Rfl: 6 .  aspirin EC 81 MG tablet, Take by mouth., Disp: , Rfl:  .  beclomethasone (QVAR) 40 MCG/ACT inhaler, Inhale 2 puffs into the lungs 2 (two) times daily., Disp: , Rfl:  .  cetirizine (ZYRTEC) 10 MG tablet, TAKE 1 TABLET (10 MG  TOTAL) BY MOUTH ONCE DAILY., Disp: 30 tablet, Rfl: 9 .  diphenhydrAMINE (BENADRYL) 25 MG tablet, Take 25 mg by mouth every 6 (six) hours as needed., Disp: , Rfl:  .  fluticasone (FLONASE) 50 MCG/ACT nasal spray, Place 1-2 sprays into both nostrils daily., Disp: 16 g, Rfl: 12 .  furosemide (LASIX) 40 MG tablet, Take 2 tablets (80 mg total) by mouth daily. (Patient taking differently: Take 80 mg by mouth daily. Once daily), Disp: 60 tablet, Rfl: 12 .  ibuprofen (ADVIL,MOTRIN) 200 MG tablet, Take by mouth., Disp: , Rfl:  .  ipratropium-albuterol (DUONEB) 0.5-2.5 (3) MG/3ML SOLN, INHALE CONTENTS OF 1 VIAL VIA NEBULIZER THREE TIMES A DAY, Disp: , Rfl:  .  latanoprost (XALATAN) 0.005 % ophthalmic solution, TAKE 1 DROP(S) IN BOTH EYES ONCE IN THE EVENING, Disp: , Rfl: 5 .  losartan (COZAAR) 100 MG tablet, Take 1 tablet (100 mg total) by mouth daily., Disp: 30 tablet, Rfl: 12 .  metFORMIN (GLUCOPHAGE-XR) 500 MG 24 hr tablet, TAKE 2 TABLETS BY MOUTH TWICE A DAY, Disp: 120 tablet, Rfl: 3 .  metoprolol (LOPRESSOR) 100 MG tablet, Take 1 tablet twice a day, Disp: 60 tablet, Rfl:  12 .  montelukast (SINGULAIR) 10 MG tablet, TAKE 1 TABLET (10 MG TOTAL) BY MOUTH NIGHTLY., Disp: 30 tablet, Rfl: 11 .  omeprazole (PRILOSEC) 20 MG capsule, TAKE ONE CAPSULE BY MOUTH TWICE A DAY, Disp: 180 capsule, Rfl: 6 .  SPIRIVA HANDIHALER 18 MCG inhalation capsule, PLACE 1 CAPSULE (18 MCG TOTAL) INTO INHALER AND INHALE ONCE DAILY., Disp: 30 capsule, Rfl: 6 .  SYMBICORT 160-4.5 MCG/ACT inhaler, INHALE 2 INHALATIONS INTO THE LUNGS 2 (TWO) TIMES DAILY., Disp: 10 Inhaler, Rfl: 6 .  VENTOLIN HFA 108 (90 BASE) MCG/ACT inhaler, Inhale 2 puffs into the lungs as needed., Disp: , Rfl: 12  Allergies  Allergen Reactions  . Shellfish Allergy Other (See Comments)  . Mold Extract [Trichophyton] Other (See Comments)     Review of Systems  Constitutional: Negative for fever, chills, weight loss and malaise/fatigue.  HENT: Negative for hearing  loss.   Eyes: Negative for blurred vision and double vision.  Respiratory: Negative for cough, shortness of breath and wheezing.   Cardiovascular: Negative for chest pain, palpitations and leg swelling.  Gastrointestinal: Negative for heartburn, abdominal pain and blood in stool.  Genitourinary: Negative for dysuria, urgency and frequency.  Skin: Negative for rash.  Neurological: Negative for dizziness, tremors, weakness and headaches.      Objective  Filed Vitals:   10/12/15 1023 10/12/15 1041 10/12/15 1044  BP: 166/90 175/90   Pulse: 73  76  Temp: 98.8 F (37.1 C)    Resp: 16    Height: 5\' 6"  (1.676 m)    Weight: 212 lb (96.163 kg)      Physical Exam  Constitutional: She is oriented to person, place, and time and well-developed, well-nourished, and in no distress. No distress.  HENT:  Head: Normocephalic and atraumatic.  Eyes: Conjunctivae and EOM are normal. Pupils are equal, round, and reactive to light.  Neck: Normal range of motion. Neck supple. Carotid bruit is not present. No thyromegaly present.  Cardiovascular: Normal rate, regular rhythm and normal heart sounds.  Exam reveals no gallop and no friction rub.   No murmur heard. Pulmonary/Chest: Effort normal. No respiratory distress. She has no wheezes. She has no rales.  Abdominal: Soft. Bowel sounds are normal. She exhibits no distension and no mass. There is no tenderness.  Musculoskeletal: She exhibits edema (trace to 1+ pedal edema bilaterally.).  Lymphadenopathy:    She has no cervical adenopathy.  Neurological: She is alert and oriented to person, place, and time.  Vitals reviewed.      Recent Results (from the past 2160 hour(s))  POCT HgB A1C     Status: Normal   Collection Time: 08/14/15  8:45 AM  Result Value Ref Range   Hemoglobin A1C 6.4 %      Assessment & Plan  Problem List Items Addressed This Visit      Cardiovascular and Mediastinum   HBP (high blood pressure) - Primary   Relevant  Medications   metoprolol (LOPRESSOR) 100 MG tablet      Meds ordered this encounter  Medications  . metoprolol (LOPRESSOR) 100 MG tablet    Sig: Take 1 tablet twice a day    Dispense:  60 tablet    Refill:  12   1. Essential hypertension  - metoprolol (LOPRESSOR) 100 MG tablet; Take 1 tablet twice a day  Dispense: 60 tablet; Refill: 12

## 2015-10-20 ENCOUNTER — Other Ambulatory Visit: Payer: Self-pay | Admitting: Family Medicine

## 2015-10-20 DIAGNOSIS — I10 Essential (primary) hypertension: Secondary | ICD-10-CM

## 2015-10-20 MED ORDER — CETIRIZINE HCL 10 MG PO TABS: 10.0000 mg | ORAL_TABLET | Freq: Every day | ORAL | Status: DC

## 2015-10-20 MED ORDER — AMLODIPINE BESYLATE 10 MG PO TABS: 10.0000 mg | ORAL_TABLET | Freq: Every day | ORAL | Status: DC

## 2015-10-20 MED ORDER — LOSARTAN POTASSIUM 100 MG PO TABS: 100.0000 mg | ORAL_TABLET | Freq: Every day | ORAL | Status: DC

## 2015-10-27 ENCOUNTER — Other Ambulatory Visit: Payer: Self-pay | Admitting: Family Medicine

## 2015-11-18 ENCOUNTER — Telehealth: Payer: Self-pay | Admitting: *Deleted

## 2015-11-18 NOTE — Telephone Encounter (Signed)
Still not able to put [facility] referral into system for patient.  Dr. Drexel Iha npi #[id number] not found. Called [facility] and left message for call back at [phone] Fax # [phone]

## 2015-11-19 ENCOUNTER — Telehealth: Payer: Self-pay | Admitting: *Deleted

## 2015-11-19 NOTE — Telephone Encounter (Signed)
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 Charmaine Downs [phone]. Patient has been contacted with confusion about her [employer].

## 2015-11-19 NOTE — Telephone Encounter (Signed)
[employer] Referral has been put in for [facility]- [employer]. Spoke with Marcelline Mates who told me to enter [phone]'s, then in notes ask that provider be added. Provider, tax Id [id number] and NPI [id number]-10 J44.9 [employer] contact # [phone]. Called [employer] back to update at [phone].

## 2015-11-20 DIAGNOSIS — J4541 Moderate persistent asthma with (acute) exacerbation: Secondary | ICD-10-CM | POA: Diagnosis not present

## 2015-11-25 ENCOUNTER — Telehealth: Payer: Self-pay | Admitting: *Deleted

## 2015-11-25 ENCOUNTER — Other Ambulatory Visit: Payer: Self-pay | Admitting: *Deleted

## 2015-11-25 MED ORDER — ALBUTEROL SULFATE HFA 108 (90 BASE) MCG/ACT IN AERS: 2.0000 | INHALATION_SPRAY | Freq: Four times a day (QID) | RESPIRATORY_TRACT | Status: DC | PRN

## 2015-11-25 NOTE — Telephone Encounter (Signed)
Received a letter from [facility] stating they were not going to cover ProAir due to a transition of policy. Dr. Juanetta Gosling wants to change to Ventolin HFA 90 mcg same directions. This was phoned into [facility].

## 2015-12-14 ENCOUNTER — Encounter: Payer: Self-pay | Admitting: Family Medicine

## 2015-12-14 ENCOUNTER — Ambulatory Visit (INDEPENDENT_AMBULATORY_CARE_PROVIDER_SITE_OTHER): Payer: Commercial Managed Care - HMO | Admitting: Family Medicine

## 2015-12-14 VITALS — BP 140/80 | HR 68 | Temp 98.1°F | Resp 16 | Ht 66.0 in | Wt 208.0 lb

## 2015-12-14 DIAGNOSIS — I10 Essential (primary) hypertension: Secondary | ICD-10-CM

## 2015-12-14 DIAGNOSIS — J431 Panlobular emphysema: Secondary | ICD-10-CM

## 2015-12-14 DIAGNOSIS — E119 Type 2 diabetes mellitus without complications: Secondary | ICD-10-CM | POA: Diagnosis not present

## 2015-12-14 NOTE — Patient Instructions (Signed)
Plan CMP, CBC, Lipid panel on return. 

## 2015-12-14 NOTE — Progress Notes (Signed)
Name: Sharon Henderson   MRN: [id number]    DOB: 02-02-1941   Date:12/14/2015       Progress Note  Subjective  Chief Complaint  Chief Complaint  Patient presents with  . Hypertension  . Diabetes    highest BS 161 and lowest 128 and avarage 135    HPI  Here to f/u DM and HBP.  Her COPD is doing well.  Overall feeling well.  Lost some weight.  No problem-specific assessment & plan notes found for this encounter.   Past Medical History  Diagnosis Date  . Acute respiratory failure (HCC)     secondary to asthma  . Asthma   . Hypertension   . Diabetes (HCC)     type 2  . Allergic rhinitis   . Hyperlipidemia   . Diverticulosis   . Internal hemorrhoids     Past Surgical History  Procedure Laterality Date  . Colonoscopy    . Cataract extraction      Family History  Problem Relation Age of Onset  . Asthma Mother     Social History   Social History  . Marital Status: Widowed    Spouse Name: N/A  . Number of Children: N/A  . Years of Education: N/A   Occupational History  . Not on file.   Social History Main Topics  . Smoking status: Never Smoker   . Smokeless tobacco: Never Used  . Alcohol Use: No  . Drug Use: No  . Sexual Activity: Not on file   Other Topics Concern  . Not on file   Social History Narrative     Current outpatient prescriptions:  .  acetaminophen (TYLENOL) 500 MG tablet, Take 1,000 mg by mouth every 8 (eight) hours as needed., Disp: , Rfl:  .  albuterol (PROAIR HFA) 108 (90 Base) MCG/ACT inhaler, Inhale 2 puffs into the lungs every 6 (six) hours as needed for wheezing or shortness of breath. Due to insurance please dispense as Ventolin HFA same directions., Disp: 8.5 Inhaler, Rfl: 6 .  amLODipine (NORVASC) 10 MG tablet, Take 1 tablet (10 mg total) by mouth daily., Disp: 90 tablet, Rfl: 3 .  aspirin EC 81 MG tablet, Take by mouth., Disp: , Rfl:  .  beclomethasone (QVAR) 40 MCG/ACT inhaler, Inhale 2 puffs into the lungs 2 (two) times  daily., Disp: , Rfl:  .  cetirizine (ZYRTEC) 10 MG tablet, Take 1 tablet (10 mg total) by mouth daily., Disp: 90 tablet, Rfl: 3 .  diphenhydrAMINE (BENADRYL) 25 MG tablet, Take 25 mg by mouth every 6 (six) hours as needed., Disp: , Rfl:  .  fluticasone (FLONASE) 50 MCG/ACT nasal spray, Place 1-2 sprays into both nostrils daily., Disp: 16 g, Rfl: 12 .  furosemide (LASIX) 40 MG tablet, Take 2 tablets (80 mg total) by mouth daily. (Patient taking differently: Take 80 mg by mouth daily. Once daily), Disp: 60 tablet, Rfl: 12 .  ibuprofen (ADVIL,MOTRIN) 200 MG tablet, Take by mouth., Disp: , Rfl:  .  ipratropium-albuterol (DUONEB) 0.5-2.5 (3) MG/3ML SOLN, INHALE CONTENTS OF 1 VIAL VIA NEBULIZER THREE TIMES A DAY, Disp: , Rfl:  .  latanoprost (XALATAN) 0.005 % ophthalmic solution, TAKE 1 DROP(S) IN BOTH EYES ONCE IN THE EVENING, Disp: , Rfl: 5 .  losartan (COZAAR) 100 MG tablet, Take 1 tablet (100 mg total) by mouth daily., Disp: 90 tablet, Rfl: 3 .  metFORMIN (GLUCOPHAGE-XR) 500 MG 24 hr tablet, TAKE 2 TABLETS BY MOUTH TWICE A DAY, Disp: 120  tablet, Rfl: 3 .  metoprolol (LOPRESSOR) 100 MG tablet, Take 1 tablet twice a day, Disp: 60 tablet, Rfl: 12 .  montelukast (SINGULAIR) 10 MG tablet, TAKE 1 TABLET (10 MG TOTAL) BY MOUTH NIGHTLY., Disp: 30 tablet, Rfl: 11 .  omeprazole (PRILOSEC) 20 MG capsule, TAKE ONE CAPSULE BY MOUTH TWICE A DAY, Disp: 180 capsule, Rfl: 6 .  SPIRIVA HANDIHALER 18 MCG inhalation capsule, PLACE 1 CAPSULE (18 MCG TOTAL) INTO INHALER AND INHALE ONCE DAILY., Disp: 30 capsule, Rfl: 6 .  SYMBICORT 160-4.5 MCG/ACT inhaler, INHALE 2 INHALATIONS INTO THE LUNGS 2 (TWO) TIMES DAILY., Disp: 10 Inhaler, Rfl: 6  Allergies  Allergen Reactions  . Shellfish Allergy Other (See Comments)  . Mold Extract [Trichophyton] Other (See Comments)     Review of Systems  Constitutional: Negative for fever, chills, weight loss and malaise/fatigue.  HENT: Negative for hearing loss.   Eyes: Negative for  blurred vision and double vision.  Respiratory: Positive for wheezing (rare). Negative for cough and shortness of breath.   Cardiovascular: Positive for leg swelling (mminimal). Negative for chest pain and palpitations.  Gastrointestinal: Negative for heartburn, abdominal pain and blood in stool.  Genitourinary: Negative for dysuria, urgency and frequency.  Skin: Negative for rash.  Neurological: Negative for tremors, weakness and headaches.      Objective  Filed Vitals:   12/14/15 1026 12/14/15 1047  BP: 146/81 140/80  Pulse: 68   Temp: 98.1 F (36.7 C)   TempSrc: Oral   Resp: 16   Height: 5\' 6"  (1.676 m)   Weight: 208 lb (94.348 kg)     Physical Exam  Constitutional: She is oriented to person, place, and time and well-developed, well-nourished, and in no distress. No distress.  HENT:  Head: Normocephalic and atraumatic.  Eyes: Conjunctivae and EOM are normal. Pupils are equal, round, and reactive to light. No scleral icterus.  Neck: Normal range of motion. Neck supple. Carotid bruit is not present. No thyromegaly present.  Cardiovascular: Normal rate, regular rhythm and normal heart sounds.  Exam reveals no gallop and no friction rub.   No murmur heard. Pulmonary/Chest: Effort normal and breath sounds normal. No respiratory distress. She has no wheezes. She has no rales.  Abdominal: Soft. Bowel sounds are normal. She exhibits no distension, no abdominal bruit and no mass. There is no tenderness.  Musculoskeletal: She exhibits edema (trace pedal edema bilaterally.).  Lymphadenopathy:    She has no cervical adenopathy.  Neurological: She is alert and oriented to person, place, and time.  Vitals reviewed.      No results found for this or any previous visit (from the past 2160 hour(s)).   Assessment & Plan  Problem List Items Addressed This Visit      Cardiovascular and Mediastinum   HBP (high blood pressure)     Respiratory   COPD (chronic obstructive pulmonary  disease) (HCC)     Endocrine   Diabetes (HCC) - Primary   Relevant Orders   POCT HgB A1C      No orders of the defined types were placed in this encounter.   1. Type 2 diabetes mellitus without complication, without long-term current use of insulin (HCC)  - POCT HgB A1C-6.4  2. Essential hypertension   3. Panlobular emphysema (HCC)   Cont. All current meds. RTC- 4 months

## 2015-12-16 ENCOUNTER — Other Ambulatory Visit: Payer: Self-pay | Admitting: Family Medicine

## 2016-01-04 ENCOUNTER — Emergency Department: Payer: Commercial Managed Care - HMO

## 2016-01-04 ENCOUNTER — Emergency Department
Admission: EM | Admit: 2016-01-04 | Discharge: 2016-01-05 | Disposition: A | Discharge status: Home / Self Care | Payer: Commercial Managed Care - HMO | Attending: Emergency Medicine | Admitting: Emergency Medicine

## 2016-01-04 DIAGNOSIS — I10 Essential (primary) hypertension: Secondary | ICD-10-CM | POA: Diagnosis not present

## 2016-01-04 DIAGNOSIS — R1010 Upper abdominal pain, unspecified: Secondary | ICD-10-CM | POA: Diagnosis not present

## 2016-01-04 DIAGNOSIS — E785 Hyperlipidemia, unspecified: Secondary | ICD-10-CM | POA: Diagnosis not present

## 2016-01-04 DIAGNOSIS — R1013 Epigastric pain: Secondary | ICD-10-CM | POA: Diagnosis not present

## 2016-01-04 DIAGNOSIS — Z7982 Long term (current) use of aspirin: Secondary | ICD-10-CM | POA: Diagnosis not present

## 2016-01-04 DIAGNOSIS — Z79899 Other long term (current) drug therapy: Secondary | ICD-10-CM | POA: Diagnosis not present

## 2016-01-04 DIAGNOSIS — Z7951 Long term (current) use of inhaled steroids: Secondary | ICD-10-CM | POA: Diagnosis not present

## 2016-01-04 DIAGNOSIS — N390 Urinary tract infection, site not specified: Secondary | ICD-10-CM | POA: Diagnosis not present

## 2016-01-04 DIAGNOSIS — J9600 Acute respiratory failure, unspecified whether with hypoxia or hypercapnia: Secondary | ICD-10-CM | POA: Insufficient documentation

## 2016-01-04 DIAGNOSIS — Z7984 Long term (current) use of oral hypoglycemic drugs: Secondary | ICD-10-CM | POA: Diagnosis not present

## 2016-01-04 DIAGNOSIS — E119 Type 2 diabetes mellitus without complications: Secondary | ICD-10-CM | POA: Insufficient documentation

## 2016-01-04 DIAGNOSIS — J45909 Unspecified asthma, uncomplicated: Secondary | ICD-10-CM | POA: Diagnosis not present

## 2016-01-04 DIAGNOSIS — J449 Chronic obstructive pulmonary disease, unspecified: Secondary | ICD-10-CM | POA: Diagnosis not present

## 2016-01-04 DIAGNOSIS — Z791 Long term (current) use of non-steroidal anti-inflammatories (NSAID): Secondary | ICD-10-CM | POA: Insufficient documentation

## 2016-01-04 DIAGNOSIS — R1011 Right upper quadrant pain: Secondary | ICD-10-CM | POA: Diagnosis not present

## 2016-01-04 LAB — URINALYSIS COMPLETE WITH MICROSCOPIC (ARMC ONLY)
Bilirubin Urine: NEGATIVE
GLUCOSE, UA: NEGATIVE mg/dL
Ketones, ur: NEGATIVE mg/dL
NITRITE: NEGATIVE
PROTEIN: NEGATIVE mg/dL
SPECIFIC GRAVITY, URINE: 1.017 (ref 1.005–1.030)
pH: 5 (ref 5.0–8.0)

## 2016-01-04 LAB — COMPREHENSIVE METABOLIC PANEL
ALBUMIN: 4.2 g/dL (ref 3.5–5.0)
ALK PHOS: 56 U/L (ref 38–126)
ALT: 14 U/L (ref 14–54)
AST: 16 U/L (ref 15–41)
Anion gap: 7 (ref 5–15)
BILIRUBIN TOTAL: 0.6 mg/dL (ref 0.3–1.2)
BUN: 18 mg/dL (ref 6–20)
CALCIUM: 9.6 mg/dL (ref 8.9–10.3)
CO2: 22 mmol/L (ref 22–32)
Chloride: 107 mmol/L (ref 101–111)
Creatinine, Ser: 1.18 mg/dL — ABNORMAL HIGH (ref 0.44–1.00)
GFR calc Af Amer: 51 mL/min — ABNORMAL LOW (ref >60)
GFR calc non Af Amer: 44 mL/min — ABNORMAL LOW (ref >60)
GLUCOSE: 161 mg/dL — AB (ref 65–99)
Potassium: 4.7 mmol/L (ref 3.5–5.1)
Sodium: 136 mmol/L (ref 135–145)
TOTAL PROTEIN: 7.4 g/dL (ref 6.5–8.1)

## 2016-01-04 LAB — CBC
HEMATOCRIT: 37.8 % (ref 35.0–47.0)
Hemoglobin: 12.4 g/dL (ref 12.0–16.0)
MCH: 30.3 pg (ref 26.0–34.0)
MCHC: 32.8 g/dL (ref 32.0–36.0)
MCV: 92.1 fL (ref 80.0–100.0)
Platelets: 210 10*3/uL (ref 150–440)
RBC: 4.11 MIL/uL (ref 3.80–5.20)
RDW: 15.6 % — ABNORMAL HIGH (ref 11.5–14.5)
WBC: 10.8 10*3/uL (ref 3.6–11.0)

## 2016-01-04 LAB — LIPASE, BLOOD: Lipase: 16 U/L (ref 11–51)

## 2016-01-04 LAB — TROPONIN I

## 2016-01-04 MED ORDER — DEXTROSE 5 % IV SOLN
1.0000 g | INTRAVENOUS | Status: DC
  Administered 2016-01-05: 1 g via INTRAVENOUS
  Filled 2016-01-04: qty 10

## 2016-01-04 MED ORDER — SODIUM CHLORIDE 0.9 % IV BOLUS (SEPSIS)
500.0000 mL | Freq: Once | INTRAVENOUS | Status: AC
  Administered 2016-01-04: 500 mL via INTRAVENOUS

## 2016-01-04 MED ORDER — MORPHINE SULFATE (PF) 4 MG/ML IV SOLN
4.0000 mg | Freq: Once | INTRAVENOUS | Status: AC
  Administered 2016-01-04: 4 mg via INTRAVENOUS
  Filled 2016-01-04: qty 1

## 2016-01-04 MED ORDER — ONDANSETRON HCL 4 MG/2ML IJ SOLN
4.0000 mg | Freq: Once | INTRAMUSCULAR | Status: AC
  Administered 2016-01-04: 4 mg via INTRAVENOUS
  Filled 2016-01-04: qty 2

## 2016-01-04 NOTE — ED Notes (Signed)
Patient ambulatory to triage with steady gait, without difficulty or distress noted; pt reports diarrhea yesterday; today with upper abd pain accomp by N/V

## 2016-01-04 NOTE — ED Provider Notes (Signed)
[facility] Emergency Department Provider Note  ____________________________________________  Time seen: Approximately 11:20 PM  I have reviewed the triage vital signs and the nursing notes.   HISTORY  Chief Complaint Abdominal Pain    HPI Sharon Henderson is a 75 y.o. female who presents to the ED from home with a chief complain of abdominal pain. Patient reports diarrhea yesterday; this afternoon began with upper abdominal pain associated with nausea and vomiting. Patient describes sharp pain to her upper abdomen which is nonradiating. Denies associated fever, chills, chest pain, shortness of breath, dysuria. Denies recent travel or trauma. Nothing makes her symptoms better or worse.   Past Medical History  Diagnosis Date  . Acute respiratory failure (HCC)     secondary to asthma  . Asthma   . Hypertension   . Diabetes (HCC)     type 2  . Allergic rhinitis   . Hyperlipidemia   . Diverticulosis   . Internal hemorrhoids     Patient Active Problem List   Diagnosis Date Noted  . Diabetes (HCC) 05/05/2015  . HBP (high blood pressure) 05/05/2015  . COPD (chronic obstructive pulmonary disease) (HCC) 05/05/2015  . CAFL (chronic airflow limitation) (HCC) 08/20/2014  . Asthma-chronic obstructive pulmonary disease overlap syndrome (HCC) 08/20/2014    Past Surgical History  Procedure Laterality Date  . Colonoscopy    . Cataract extraction      Current Outpatient Rx  Name  Route  Sig  Dispense  Refill  . acetaminophen (TYLENOL) 500 MG tablet   Oral   Take 1,000 mg by mouth every 8 (eight) hours as needed.         [name]. albuterol (PROAIR HFA) 108 (90 Base) MCG/ACT inhaler   Inhalation   Inhale 2 puffs into the lungs every 6 (six) hours as needed for wheezing or shortness of breath. Due to insurance please dispense as Ventolin HFA same directions.   8.5 Inhaler   6   . amLODipine (NORVASC) 10 MG tablet   Oral   Take 1 tablet (10 mg total) by mouth  daily.   90 tablet   3   . aspirin EC 81 MG tablet   Oral   Take by mouth.         . beclomethasone (QVAR) 40 MCG/ACT inhaler   Inhalation   Inhale 2 puffs into the lungs 2 (two) times daily.         . cetirizine (ZYRTEC) 10 MG tablet   Oral   Take 1 tablet (10 mg total) by mouth daily.   90 tablet   3   . diphenhydrAMINE (BENADRYL) 25 MG tablet   Oral   Take 25 mg by mouth every 6 (six) hours as needed.         . fluticasone (FLONASE) 50 MCG/ACT nasal spray   Each Nare   Place 1-2 sprays into both nostrils daily.   16 g   12   . furosemide (LASIX) 40 MG tablet   Oral   Take 2 tablets (80 mg total) by mouth daily. Patient taking differently: Take 80 mg by mouth daily. Once daily   60 tablet   12   . ibuprofen (ADVIL,MOTRIN) 200 MG tablet   Oral   Take by mouth.         [name]. ipratropium-albuterol (DUONEB) 0.5-2.5 (3) MG/3ML SOLN      INHALE CONTENTS OF 1 VIAL VIA NEBULIZER THREE TIMES A DAY         .  latanoprost (XALATAN) 0.005 % ophthalmic solution      TAKE 1 DROP(S) IN BOTH EYES ONCE IN THE EVENING      5   . losartan (COZAAR) 100 MG tablet   Oral   Take 1 tablet (100 mg total) by mouth daily.   90 tablet   3   . metFORMIN (GLUCOPHAGE-XR) 500 MG 24 hr tablet      TAKE 2 TABLETS BY MOUTH TWICE A DAY   120 tablet   3   . metoprolol (LOPRESSOR) 100 MG tablet      Take 1 tablet twice a day   60 tablet   12   . montelukast (SINGULAIR) 10 MG tablet      TAKE 1 TABLET (10 MG TOTAL) BY MOUTH NIGHTLY.   30 tablet   11   . omeprazole (PRILOSEC) 20 MG capsule      TAKE ONE CAPSULE BY MOUTH TWICE A DAY   180 capsule   6   . SPIRIVA HANDIHALER 18 MCG inhalation capsule      PLACE 1 CAPSULE (18 MCG TOTAL) INTO INHALER AND INHALE ONCE DAILY.   30 capsule   6   . SYMBICORT 160-4.5 MCG/ACT inhaler      INHALE 2 INHALATIONS INTO THE LUNGS 2 (TWO) TIMES DAILY.   10 Inhaler   6     Allergies Shellfish allergy and Mold  extract  Family History  Problem Relation Age of Onset  . Asthma Mother     Social History Social History  Substance Use Topics  . Smoking status: Never Smoker   . Smokeless tobacco: Never Used  . Alcohol Use: No    Review of Systems  Constitutional: No fever/chills. Eyes: No visual changes. ENT: No sore throat. Cardiovascular: Denies chest pain. Respiratory: Denies shortness of breath. Gastrointestinal: Positive for abdominal pain, nausea, vomiting and diarrhea.  No constipation. Genitourinary: Negative for dysuria. Musculoskeletal: Negative for back pain. Skin: Negative for rash. Neurological: Negative for headaches, focal weakness or numbness.  10-point ROS otherwise negative.  ____________________________________________   PHYSICAL EXAM:  VITAL SIGNS: ED Triage Vitals  Enc Vitals Group     BP 01/04/16 2112 164/100 mmHg     Pulse Rate 01/04/16 2112 89     Resp 01/04/16 2112 18     Temp 01/04/16 2112 98.2 F (36.8 C)     Temp Source 01/04/16 2112 Oral     SpO2 01/04/16 2112 97 %     Weight 01/04/16 2112 208 lb (94.348 kg)     Height 01/04/16 2112  (1.676 m)     Head Cir --      Peak Flow --      Pain Score 01/04/16 2112 8     Pain Loc --      Pain Edu? --      Excl. in GC? --     Constitutional: Alert and oriented. Well appearing and in no acute distress. Eyes: Conjunctivae are normal. PERRL. EOMI. Head: Atraumatic. Nose: No congestion/rhinnorhea. Mouth/Throat: Mucous membranes are moist.  Oropharynx non-erythematous. Neck: No stridor.  Cardiovascular: Normal rate, regular rhythm. Grossly normal heart sounds.  Good peripheral circulation. Respiratory: Normal respiratory effort.  No retractions. Lungs CTAB. Gastrointestinal: Obese. Soft and mildly tender to palpation epigastrium without rebound or guarding. No distention. No abdominal bruits. No CVA tenderness. Musculoskeletal: No lower extremity tenderness nor edema.  No joint  effusions. Neurologic:  Normal speech and language. No gross focal neurologic deficits are appreciated. No gait  instability. Skin:  Skin is warm, dry and intact. No rash noted. Psychiatric: Mood and affect are normal. Speech and behavior are normal.  ____________________________________________   LABS (all labs ordered are listed, but only abnormal results are displayed)  Labs Reviewed  COMPREHENSIVE METABOLIC PANEL - Abnormal; Notable for the following:    Glucose, Bld 161 (*)    Creatinine, Ser 1.18 (*)    GFR calc non Af Amer 44 (*)    GFR calc Af Amer 51 (*)    All other components within normal limits  CBC - Abnormal; Notable for the following:    RDW 15.6 (*)    All other components within normal limits  URINALYSIS COMPLETEWITH MICROSCOPIC ([facility] ONLY) - Abnormal; Notable for the following:    Color, Urine YELLOW (*)    APPearance CLEAR (*)    Hgb urine dipstick 1+ (*)    Leukocytes, UA 3+ (*)    Bacteria, UA RARE (*)    Squamous Epithelial / LPF 0-5 (*)    All other components within normal limits  URINE CULTURE  LIPASE, BLOOD  TROPONIN I   ____________________________________________  EKG  ED ECG REPORT I, Zoua Caporaso J, the attending physician, personally viewed and interpreted this ECG.   Date: 01/04/2016  EKG Time: 2116  Rate: 82  Rhythm: normal EKG, normal sinus rhythm  Axis: Normal  Intervals:none  ST&T Change: Nonspecific  ____________________________________________  RADIOLOGY  US abdomen Limited RUQ interpreted per Dr. Clovis Riley: Normal liver, gallbladder and bile ducts. ____________________________________________   PROCEDURES  Procedure(s) performed: None  Critical Care performed: No  ____________________________________________   INITIAL IMPRESSION / ASSESSMENT AND PLAN / ED COURSE  Pertinent labs & imaging results that were available during my care of the patient were reviewed by me and considered in my medical decision making (see  chart for details).  75 year old female who presents with upper abdominal pain, nausea, vomiting and diarrhea yesterday. Laboratory and urinalysis results notable for UTI. Will administer IV analgesia with antiemetic, initiate IV antibiotics and obtained ultrasound to evaluate for cholecystitis.  ----------------------------------------- 1:53 AM on 01/05/2016 -----------------------------------------  Patient is pain-free, smiling and visiting with family member. Updated patient of ultrasound results. She has tolerated PO emesis. Will continue antibiotic for UTI. Strict return precautions given. Patient and family member verbalized understanding and agree with plan of care. ____________________________________________   FINAL CLINICAL IMPRESSION(S) / ED DIAGNOSES  Final diagnoses:  Pain of upper abdomen  UTI (lower urinary tract infection)      Irean Hong, MD 01/05/16 [id number]

## 2016-01-05 ENCOUNTER — Encounter: Payer: Self-pay | Admitting: Emergency Medicine

## 2016-01-05 DIAGNOSIS — R1011 Right upper quadrant pain: Secondary | ICD-10-CM | POA: Diagnosis not present

## 2016-01-05 MED ORDER — CEPHALEXIN 500 MG PO CAPS: 500.0000 mg | ORAL_CAPSULE | Freq: Four times a day (QID) | ORAL | Status: DC

## 2016-01-05 NOTE — Discharge Instructions (Signed)
1. Take antibiotic as prescribed (Keflex 500 mg 3 times daily 7 days). 2. Urine cultures pending. You will be notified if we need to change her antibiotic. 3. Return to the ER for worsening symptoms, persistent vomiting, difficulty breathing or other concerns.  Urine Culture and Sensitivity Testing WHY AM I HAVING THIS TEST?  A urine culture is a test to see if germs grow from your urine sample. Normally, urine is free of germs (sterile). Germs in urine are usually bacteria. Sometimes they can be yeasts. These germs can cause a urinary tract infection (UTI). You may have this test if you have symptoms of a UTI. These may include:  Frequent urination.  Burning pain when passing urine. If you are pregnant, your health care provider may order this test to screen you for a UTI. When you pass urine, the urine flows through the tube that empties your bladder (urethra). In men, urine comes out through an opening at the tip of the penis. In women, it comes out of the body from just above the vaginal opening. These areas may have bacteria near them that normally live on the skin (normal flora). WHAT KIND OF SAMPLE IS TAKEN? A urine sample for a culture test must be collected in a way that keeps normal flora from getting into the sample. The method used most often is called a clean-catch sample. In a few cases, urine may need to be collected directly from the bladder using a thin, flexible tube (catheter). The health care provider puts the catheter through the person's urethra and into the bladder. Your urine sample will be placed onto plates containing a substance that encourages bacteria to grow (agar plates). These plates are kept at body temperature for 24-48 hours to see if bacteria or other germs grow. Then, a lab technician examines them under a microscope to check for germs. Any germs that grow from the culture will be tested against a variety of medicines to find the one that works best (sensitivity  testing). For a UTI caused by bacteria, several types of antibiotic medicines may be tested. HOW DO I PREPARE FOR THE TEST?  Do not urinate for about an hour before collecting the sample.  Drink a glass of water about 20 minutes before collecting the sample.  Tell your health care provider if you have been taking antibiotics. This may affect the results of your test. Your health care provider may give you sterile wipes to clean your vagina or penis to prepare for collecting a clean-catch sample. To collect the sample, you will need to do the following: For Women and Girls  Sit on the toilet and spread the lips of your vagina.  Use one wipe to clean your vaginal area from front to back.  Use a second wipe to clean the opening of your urethra.  Pass a small amount of urine directly into the toilet while still spreading your vagina.  Then, hold the sterile cup underneath you and urinate into it.  Fill the cup about halfway. Cap it and return it for testing. For Men and Boys  Use the sterile wipe to clean the tip of your penis.  Pass a small amount of urine directly into the toilet first.  Then, urinate into the sterile cup.  Fill the cup about halfway. Cap it and return it for testing. WHAT DO THE RESULTS MEAN? The result of a urine culture and sensitivity test will be positive or negative.   If enough bacteria grow from  your urine sample, your test result is considered positive.  If many different bacteria grow from your urine sample, your test may be reported as contaminated.  If no bacteria grow from your sample after 24-48 hours, your test result is considered negative.  Results of sensitivity testing let your health care provider know which medicines to use to treat your infection. If the results of your urine culture are negative, this means:  It is less likely that you have a UTI.  Your test may be repeated if you still have symptoms. If the results of your urine  culture are positive, this means:  It is more likely that you have a UTI.  You may need to start treatment based on your sensitivity results. Talk to your health care provider to discuss your results, treatment options, and if necessary, the need for more tests. It is your responsibility to obtain your test results. Ask the lab or department performing the test when and how you will get your results. Talk with your health care provider if you have any questions about your results.   This information is not intended to replace advice given to you by your health care provider. Make sure you discuss any questions you have with your health care provider.   Document Released: 10/07/2004 Document Revised: 10/03/2014 Document Reviewed: 01/09/2014 Elsevier Interactive Patient Education 2016 [employer]   Abdominal Pain, Adult Many things can cause abdominal pain. Usually, abdominal pain is not caused by a disease and will improve without treatment. It can often be observed and treated at home. Your health care provider will do a physical exam and possibly order blood tests and X-rays to help determine the seriousness of your pain. However, in many cases, more time must pass before a clear cause of the pain can be found. Before that point, your health care provider may not know if you need more testing or further treatment. HOME CARE INSTRUCTIONS Monitor your abdominal pain for any changes. The following actions may help to alleviate any discomfort you are experiencing:  Only take over-the-counter or prescription medicines as directed by your health care provider.  Do not take laxatives unless directed to do so by your health care provider.  Try a clear liquid diet (broth, tea, or water) as directed by your health care provider. Slowly move to a bland diet as tolerated. SEEK MEDICAL CARE IF:  You have unexplained abdominal pain.  You have abdominal pain associated with nausea or  diarrhea.  You have pain when you urinate or have a bowel movement.  You experience abdominal pain that wakes you in the night.  You have abdominal pain that is worsened or improved by eating food.  You have abdominal pain that is worsened with eating fatty foods.  You have a fever. SEEK IMMEDIATE MEDICAL CARE IF:  Your pain does not go away within 2 hours.  You keep throwing up (vomiting).  Your pain is felt only in portions of the abdomen, such as the right side or the left lower portion of the abdomen.  You pass bloody or black tarry stools. MAKE SURE YOU:  Understand these instructions.  Will watch your condition.  Will get help right away if you are not doing well or get worse.   This information is not intended to replace advice given to you by your health care provider. Make sure you discuss any questions you have with your health care provider.   Document Released: 06/22/2005 Document Revised: 06/03/2015 Document  Reviewed: 05/22/2013 Elsevier Interactive Patient Education 2016 [employer]  Urinary Tract Infection Urinary tract infections (UTIs) can develop anywhere along your urinary tract. Your urinary tract is your body's drainage system for removing wastes and extra water. Your urinary tract includes two kidneys, two ureters, a bladder, and a urethra. Your kidneys are a pair of bean-shaped organs. Each kidney is about the size of your fist. They are located below your ribs, one on each side of your spine. CAUSES Infections are caused by microbes, which are microscopic organisms, including fungi, viruses, and bacteria. These organisms are so small that they can only be seen through a microscope. Bacteria are the microbes that most commonly cause UTIs. SYMPTOMS  Symptoms of UTIs may vary by age and gender of the patient and by the location of the infection. Symptoms in young women typically include a frequent and intense urge to urinate and a painful, burning feeling  in the bladder or urethra during urination. Older women and men are more likely to be tired, shaky, and weak and have muscle aches and abdominal pain. A fever may mean the infection is in your kidneys. Other symptoms of a kidney infection include pain in your back or sides below the ribs, nausea, and vomiting. DIAGNOSIS To diagnose a UTI, your caregiver will ask you about your symptoms. Your caregiver will also ask you to provide a urine sample. The urine sample will be tested for bacteria and white blood cells. White blood cells are made by your body to help fight infection. TREATMENT  Typically, UTIs can be treated with medication. Because most UTIs are caused by a bacterial infection, they usually can be treated with the use of antibiotics. The choice of antibiotic and length of treatment depend on your symptoms and the type of bacteria causing your infection. HOME CARE INSTRUCTIONS  If you were prescribed antibiotics, take them exactly as your caregiver instructs you. Finish the medication even if you feel better after you have only taken some of the medication.  Drink enough water and fluids to keep your urine clear or pale yellow.  Avoid caffeine, tea, and carbonated beverages. They tend to irritate your bladder.  Empty your bladder often. Avoid holding urine for long periods of time.  Empty your bladder before and after sexual intercourse.  After a bowel movement, women should cleanse from front to back. Use each tissue only once. SEEK MEDICAL CARE IF:   You have back pain.  You develop a fever.  Your symptoms do not begin to resolve within 3 days. SEEK IMMEDIATE MEDICAL CARE IF:   You have severe back pain or lower abdominal pain.  You develop chills.  You have nausea or vomiting.  You have continued burning or discomfort with urination. MAKE SURE YOU:   Understand these instructions.  Will watch your condition.  Will get help right away if you are not doing well or  get worse.   This information is not intended to replace advice given to you by your health care provider. Make sure you discuss any questions you have with your health care provider.   Document Released: 06/22/2005 Document Revised: 06/03/2015 Document Reviewed: 10/21/2011 Elsevier Interactive Patient Education [employer]

## 2016-01-06 LAB — URINE CULTURE
Culture: >=100000 — AB
Special Requests: NORMAL

## 2016-01-14 ENCOUNTER — Encounter: Payer: Self-pay | Admitting: Family Medicine

## 2016-01-14 ENCOUNTER — Ambulatory Visit (INDEPENDENT_AMBULATORY_CARE_PROVIDER_SITE_OTHER): Payer: Commercial Managed Care - HMO | Admitting: Family Medicine

## 2016-01-14 VITALS — BP 148/91 | HR 65 | Temp 98.1°F | Resp 16 | Ht 66.0 in | Wt 210.4 lb

## 2016-01-14 DIAGNOSIS — J431 Panlobular emphysema: Secondary | ICD-10-CM

## 2016-01-14 DIAGNOSIS — I10 Essential (primary) hypertension: Secondary | ICD-10-CM | POA: Diagnosis not present

## 2016-01-14 DIAGNOSIS — R600 Localized edema: Secondary | ICD-10-CM

## 2016-01-14 DIAGNOSIS — E119 Type 2 diabetes mellitus without complications: Secondary | ICD-10-CM

## 2016-01-14 MED ORDER — FUROSEMIDE 40 MG PO TABS: ORAL_TABLET | ORAL | Status: DC

## 2016-01-14 NOTE — Progress Notes (Signed)
Name: Sharon Henderson   MRN: [id number]    DOB: 08-Dec-1940   Date:01/14/2016       Progress Note  Subjective  Chief Complaint  Chief Complaint  Patient presents with  . Foot Swelling    Edema caused by new bp med    HPI Here c/o bilateral pedal edema.  Started 1 week ago with drinking extra fluids with a bladder infection that she was treated with Keflex for in ER.  She has finished Keflex 2 days ago, but is still drinking extra fluids and pedal edema still present.  Edema not present before ER visit.  No problem-specific assessment & plan notes found for this encounter.   Past Medical History  Diagnosis Date  . Acute respiratory failure (HCC)     secondary to asthma  . Asthma   . Hypertension   . Diabetes ([place])     type 2  . Allergic rhinitis   . Hyperlipidemia   . Diverticulosis   . Internal hemorrhoids     Past Surgical History  Procedure Laterality Date  . Colonoscopy    . Cataract extraction      Family History  Problem Relation Age of Onset  . Asthma Mother     Social History   Social History  . Marital Status: Widowed    Spouse Name: N/A  . Number of Children: N/A  . Years of Education: N/A   Occupational History  . Not on file.   Social History Main Topics  . Smoking status: Never Smoker   . Smokeless tobacco: Never Used  . Alcohol Use: No  . Drug Use: No  . Sexual Activity: Not on file   Other Topics Concern  . Not on file   Social History Narrative     Current outpatient prescriptions:  .  acetaminophen (TYLENOL) 500 MG tablet, Take 1,000 mg by mouth every 8 (eight) hours as needed., Disp: , Rfl:  .  albuterol (PROAIR HFA) 108 (90 Base) MCG/ACT inhaler, Inhale 2 puffs into the lungs every 6 (six) hours as needed for wheezing or shortness of breath. Due to insurance please dispense as Ventolin HFA same directions., Disp: 8.5 Inhaler, Rfl: 6 .  amLODipine (NORVASC) 10 MG tablet, Take 1 tablet (10 mg total) by mouth daily., Disp: 90 tablet,  Rfl: 3 .  aspirin EC 81 MG tablet, Take by mouth., Disp: , Rfl:  .  beclomethasone (QVAR) 40 MCG/ACT inhaler, Inhale 2 puffs into the lungs 2 (two) times daily., Disp: , Rfl:  .  cetirizine (ZYRTEC) 10 MG tablet, Take 1 tablet (10 mg total) by mouth daily., Disp: 90 tablet, Rfl: 3 .  diphenhydrAMINE (BENADRYL) 25 MG tablet, Take 25 mg by mouth every 6 (six) hours as needed., Disp: , Rfl:  .  fluticasone (FLONASE) 50 MCG/ACT nasal spray, Place 1-2 sprays into both nostrils daily., Disp: 16 g, Rfl: 12 .  furosemide (LASIX) 40 MG tablet, Take 1 tablet twice a day except with extra swelling, then take 2 tabs in AM and 1 in early afternoon.  Take extra tabs no more than 5 days., Disp: 70 tablet, Rfl: 12 .  ibuprofen (ADVIL,MOTRIN) 200 MG tablet, Take by mouth., Disp: , Rfl:  .  ipratropium-albuterol (DUONEB) 0.5-2.5 (3) MG/3ML SOLN, INHALE CONTENTS OF 1 VIAL VIA NEBULIZER THREE TIMES A DAY, Disp: , Rfl:  .  latanoprost (XALATAN) 0.005 % ophthalmic solution, TAKE 1 DROP(S) IN BOTH EYES ONCE IN THE EVENING, Disp: , Rfl: 5 .  losartan (  COZAAR) 100 MG tablet, Take 1 tablet (100 mg total) by mouth daily., Disp: 90 tablet, Rfl: 3 .  metFORMIN (GLUCOPHAGE-XR) 500 MG 24 hr tablet, TAKE 2 TABLETS BY MOUTH TWICE A DAY, Disp: 120 tablet, Rfl: 3 .  metoprolol (LOPRESSOR) 100 MG tablet, Take 1 tablet twice a day, Disp: 60 tablet, Rfl: 12 .  montelukast (SINGULAIR) 10 MG tablet, TAKE 1 TABLET (10 MG TOTAL) BY MOUTH NIGHTLY., Disp: 30 tablet, Rfl: 11 .  omeprazole (PRILOSEC) 20 MG capsule, TAKE ONE CAPSULE BY MOUTH TWICE A DAY, Disp: 180 capsule, Rfl: 6 .  SPIRIVA HANDIHALER 18 MCG inhalation capsule, PLACE 1 CAPSULE (18 MCG TOTAL) INTO INHALER AND INHALE ONCE DAILY., Disp: 30 capsule, Rfl: 6 .  SYMBICORT 160-4.5 MCG/ACT inhaler, INHALE 2 INHALATIONS INTO THE LUNGS 2 (TWO) TIMES DAILY., Disp: 10 Inhaler, Rfl: 6 .  cephALEXin (KEFLEX) 500 MG capsule, Take 1 capsule (500 mg total) by mouth 4 (four) times daily. (Patient  not taking: Reported on 01/14/2016), Disp: 21 capsule, Rfl: 0  Allergies  Allergen Reactions  . Shellfish Allergy Other (See Comments)  . Mold Extract [Trichophyton] Other (See Comments)     Review of Systems  Constitutional: Negative for fever, chills, weight loss and malaise/fatigue.  HENT: Negative for hearing loss.   Eyes: Negative for blurred vision and double vision.  Respiratory: Positive for wheezing (occ.). Negative for cough and shortness of breath.   Cardiovascular: Positive for leg swelling. Negative for chest pain and palpitations.  Gastrointestinal: Negative for heartburn, abdominal pain and blood in stool.  Genitourinary: Negative for dysuria, urgency and frequency.  Skin: Negative for rash.  Neurological: Negative for tremors, weakness and headaches.      Objective  Filed Vitals:   01/14/16 1041  BP: 148/91  Pulse: 65  Temp: 98.1 F (36.7 C)  TempSrc: Oral  Resp: 16  Height: '5\' 6"'  (1.676 m)  Weight: 210 lb 6.4 oz (95.437 kg)  SpO2: 100%    Physical Exam  Constitutional: She is oriented to person, place, and time and well-developed, well-nourished, and in no distress. No distress.  HENT:  Head: Normocephalic and atraumatic.  Cardiovascular: Normal rate, regular rhythm and normal heart sounds.  Exam reveals no gallop and no friction rub.   No murmur heard. Pulmonary/Chest: Effort normal and breath sounds normal. No respiratory distress. She has no wheezes. She has no rales.  Abdominal: Soft. Bowel sounds are normal. She exhibits no distension and no mass. There is no tenderness.  Musculoskeletal: She exhibits edema (1+ bilateral pedal edema).  Neurological: She is alert and oriented to person, place, and time.  Vitals reviewed.      Recent Results (from the past 2160 hour(s))  Lipase, blood     Status: None   Collection Time: 01/04/16  9:14 PM  Result Value Ref Range   Lipase 16 11 - 51 U/L  Comprehensive metabolic panel     Status: Abnormal    Collection Time: 01/04/16  9:14 PM  Result Value Ref Range   Sodium 136 135 - 145 mmol/L   Potassium 4.7 3.5 - 5.1 mmol/L   Chloride 107 101 - 111 mmol/L   CO2 22 22 - 32 mmol/L   Glucose, Bld 161 (H) 65 - 99 mg/dL   BUN 18 6 - 20 mg/dL   Creatinine, Ser 1.18 (H) 0.44 - 1.00 mg/dL   Calcium 9.6 8.9 - 10.3 mg/dL   Total Protein 7.4 6.5 - 8.1 g/dL   Albumin 4.2 3.5 - 5.0  g/dL   AST 16 15 - 41 U/L   ALT 14 14 - 54 U/L   Alkaline Phosphatase 56 38 - 126 U/L   Total Bilirubin 0.6 0.3 - 1.2 mg/dL   GFR calc non Af Amer 44 (L) >60 mL/min   GFR calc Af Amer 51 (L) >60 mL/min    Comment: (NOTE) The eGFR has been calculated using the CKD EPI equation. This calculation has not been validated in all clinical situations. eGFR's persistently <60 mL/min signify possible Chronic Kidney Disease.    Anion gap 7 5 - 15  CBC     Status: Abnormal   Collection Time: 01/04/16  9:14 PM  Result Value Ref Range   WBC 10.8 3.6 - 11.0 K/uL   RBC 4.11 3.80 - 5.20 MIL/uL   Hemoglobin 12.4 12.0 - 16.0 g/dL   HCT 37.8 35.0 - 47.0 %   MCV 92.1 80.0 - 100.0 fL   MCH 30.3 26.0 - 34.0 pg   MCHC 32.8 32.0 - 36.0 g/dL   RDW 15.6 (H) 11.5 - 14.5 %   Platelets 210 150 - 440 K/uL    Comment: COUNT MAY BE INACCURATE DUE TO FIBRIN CLUMPS.  Troponin I     Status: None   Collection Time: 01/04/16  9:14 PM  Result Value Ref Range   Troponin I <0.03 <0.031 ng/mL    Comment:        NO INDICATION OF MYOCARDIAL INJURY.   Urine culture     Status: Abnormal   Collection Time: 01/04/16  9:14 PM  Result Value Ref Range   Specimen Description URINE, RANDOM    Special Requests Normal    Culture (A)     >=100,000 COLONIES/mL GROUP B STREP(S.AGALACTIAE)ISOLATED Virtually 100% of S. agalactiae (Group B) strains are susceptible to Penicillin.  For Penicillin-allergic patients, Erythromycin (85-95% sensitive) and Clindamycin (80% sensitive) are drugs of choice. Contact microbiology lab to request sensitivities if  needed  within 7 days.    Report Status 01/06/2016 FINAL   Urinalysis complete, with microscopic ([facility] only)     Status: Abnormal   Collection Time: 01/04/16  9:15 PM  Result Value Ref Range   Color, Urine YELLOW (A) YELLOW   APPearance CLEAR (A) CLEAR   Glucose, UA NEGATIVE NEGATIVE mg/dL   Bilirubin Urine NEGATIVE NEGATIVE   Ketones, ur NEGATIVE NEGATIVE mg/dL   Specific Gravity, Urine 1.017 1.005 - 1.030   Hgb urine dipstick 1+ (A) NEGATIVE   pH 5.0 5.0 - 8.0   Protein, ur NEGATIVE NEGATIVE mg/dL   Nitrite NEGATIVE NEGATIVE   Leukocytes, UA 3+ (A) NEGATIVE   RBC / HPF 6-30 0 - 5 RBC/hpf   WBC, UA 6-30 0 - 5 WBC/hpf   Bacteria, UA RARE (A) NONE SEEN   Squamous Epithelial / LPF 0-5 (A) NONE SEEN   Mucous PRESENT      Assessment & Plan  Problem List Items Addressed This Visit      Cardiovascular and Mediastinum   HBP (high blood pressure)   Relevant Medications   furosemide (LASIX) 40 MG tablet     Respiratory   COPD (chronic obstructive pulmonary disease) (HCC)     Endocrine   Diabetes mellitus type 2, controlled, without complications ([place])    Other Visit Diagnoses    Pedal edema    -  Primary    Relevant Medications    furosemide (LASIX) 40 MG tablet    Other Relevant Orders    Comprehensive Metabolic Panel (  CMET)       Meds ordered this encounter  Medications  . furosemide (LASIX) 40 MG tablet    Sig: Take 1 tablet twice a day except with extra swelling, then take 2 tabs in AM and 1 in early afternoon.  Take extra tabs no more than 5 days.    Dispense:  70 tablet    Refill:  12   1. Pedal edema  - furosemide (LASIX) 40 MG tablet; Take 1 tablet twice a day except with extra swelling, then take 2 tabs in AM and 1 in early afternoon.  Take extra tabs no more than 5 days.  Dispense: 70 tablet; Refill: 12 - Comprehensive Metabolic Panel (CMET)  2. Essential hypertension  Cont meds 3. Panlobular emphysema (HCC) Cont meds  4. Controlled type 2 diabetes mellitus  without complication, without long-term current use of insulin (HCC) Cont meds

## 2016-02-29 DIAGNOSIS — R600 Localized edema: Secondary | ICD-10-CM | POA: Diagnosis not present

## 2016-03-01 LAB — COMPREHENSIVE METABOLIC PANEL
A/G RATIO: 1.7 (ref 1.2–2.2)
ALT: 7 IU/L (ref 0–32)
AST: 9 IU/L (ref 0–40)
Albumin: 4.3 g/dL (ref 3.5–4.8)
Alkaline Phosphatase: 62 IU/L (ref 39–117)
BILIRUBIN TOTAL: 0.3 mg/dL (ref 0.0–1.2)
BUN/Creatinine Ratio: 19 (ref 12–28)
BUN: 22 mg/dL (ref 8–27)
CO2: 22 mmol/L (ref 18–29)
Calcium: 9.5 mg/dL (ref 8.7–10.3)
Chloride: 99 mmol/L (ref 96–106)
Creatinine, Ser: 1.18 mg/dL — ABNORMAL HIGH (ref 0.57–1.00)
GFR calc Af Amer: 52 mL/min/{1.73_m2} — ABNORMAL LOW (ref >59)
GFR, EST NON AFRICAN AMERICAN: 45 mL/min/{1.73_m2} — AB (ref >59)
GLOBULIN, TOTAL: 2.6 g/dL (ref 1.5–4.5)
Glucose: 125 mg/dL — ABNORMAL HIGH (ref 65–99)
POTASSIUM: 4 mmol/L (ref 3.5–5.2)
SODIUM: 143 mmol/L (ref 134–144)
Total Protein: 6.9 g/dL (ref 6.0–8.5)

## 2016-03-04 ENCOUNTER — Other Ambulatory Visit: Payer: Self-pay | Admitting: Family Medicine

## 2016-04-18 ENCOUNTER — Ambulatory Visit (INDEPENDENT_AMBULATORY_CARE_PROVIDER_SITE_OTHER): Payer: Commercial Managed Care - HMO | Admitting: Family Medicine

## 2016-04-18 ENCOUNTER — Encounter: Payer: Self-pay | Admitting: Family Medicine

## 2016-04-18 VITALS — BP 120/75 | HR 56 | Temp 97.5°F | Resp 16 | Ht 66.0 in | Wt 207.0 lb

## 2016-04-18 DIAGNOSIS — E119 Type 2 diabetes mellitus without complications: Secondary | ICD-10-CM

## 2016-04-18 DIAGNOSIS — I10 Essential (primary) hypertension: Secondary | ICD-10-CM | POA: Diagnosis not present

## 2016-04-18 LAB — POCT UA - MICROALBUMIN: Microalbumin Ur, POC: NEGATIVE mg/L

## 2016-04-18 LAB — POCT GLYCOSYLATED HEMOGLOBIN (HGB A1C): Hemoglobin A1C: 6.8

## 2016-04-18 MED ORDER — METFORMIN HCL 500 MG PO TABS: 500.0000 mg | ORAL_TABLET | Freq: Every day | ORAL | 3 refills | Status: DC

## 2016-04-18 NOTE — Progress Notes (Signed)
Subjective:    Patient ID: Sharon Henderson, female    DOB: Dec 15, 1940, 75 y.o.   MRN: [id number]  HPI: Sharon Henderson is a 75 y.o. female presenting on 04/18/2016 for Diabetes and Hypertension   HPI  Pt presents for hypertension and diabetes follow-up. Hg A1c is up 6.8%. Metformin was stopped due to kidney function. However kidney function is stable.  Sees opthamology for glaucoma- taking eye drops.  BP is controlled.   Past Medical History:  Diagnosis Date  . Acute respiratory failure (HCC)    secondary to asthma  . Allergic rhinitis   . Asthma   . Diabetes (HCC)    type 2  . Diverticulosis   . Hyperlipidemia   . Hypertension   . Internal hemorrhoids     Current Outpatient Prescriptions on File Prior to Visit  Medication Sig  . acetaminophen (TYLENOL) 500 MG tablet Take 1,000 mg by mouth every 8 (eight) hours as needed.  [name] albuterol (PROAIR HFA) 108 (90 Base) MCG/ACT inhaler Inhale 2 puffs into the lungs every 6 (six) hours as needed for wheezing or shortness of breath. Due to insurance please dispense as Ventolin HFA same directions.  [name] amLODipine (NORVASC) 10 MG tablet Take 1 tablet (10 mg total) by mouth daily.  [name] aspirin EC 81 MG tablet Take by mouth.  . beclomethasone (QVAR) 40 MCG/ACT inhaler Inhale 2 puffs into the lungs 2 (two) times daily.  . cetirizine (ZYRTEC) 10 MG tablet Take 1 tablet (10 mg total) by mouth daily.  . diphenhydrAMINE (BENADRYL) 25 MG tablet Take 25 mg by mouth every 6 (six) hours as needed.  . fluticasone (FLONASE) 50 MCG/ACT nasal spray Place 1-2 sprays into both nostrils daily.  . furosemide (LASIX) 40 MG tablet Take 1 tablet twice a day except with extra swelling, then take 2 tabs in AM and 1 in early afternoon.  Take extra tabs no more than 5 days.  [name] ibuprofen (ADVIL,MOTRIN) 200 MG tablet Take by mouth.  [name] ipratropium-albuterol (DUONEB) 0.5-2.5 (3) MG/3ML SOLN INHALE CONTENTS OF 1 VIAL VIA NEBULIZER THREE TIMES A DAY  . latanoprost (XALATAN)  0.005 % ophthalmic solution TAKE 1 DROP(S) IN BOTH EYES ONCE IN THE EVENING  . losartan (COZAAR) 100 MG tablet Take 1 tablet (100 mg total) by mouth daily.  . metoprolol (LOPRESSOR) 100 MG tablet Take 1 tablet twice a day  . montelukast (SINGULAIR) 10 MG tablet TAKE 1 TABLET (10 MG TOTAL) BY MOUTH NIGHTLY.  [name] omeprazole (PRILOSEC) 20 MG capsule TAKE ONE CAPSULE BY MOUTH TWICE A DAY  . SPIRIVA HANDIHALER 18 MCG inhalation capsule PLACE 1 CAPSULE (18 MCG TOTAL) INTO INHALER AND INHALE ONCE DAILY.  . SYMBICORT 160-4.5 MCG/ACT inhaler INHALE 2 INHALATIONS INTO THE LUNGS 2 (TWO) TIMES DAILY.   No current facility-administered medications on file prior to visit.     Review of Systems  Constitutional: Negative for chills and fever.  HENT: Negative.   Respiratory: Negative for cough, chest tightness and wheezing.   Cardiovascular: Negative for chest pain and leg swelling.  Gastrointestinal: Negative for abdominal pain, constipation, diarrhea, nausea and vomiting.  Endocrine: Negative.  Negative for cold intolerance, heat intolerance, polydipsia, polyphagia and polyuria.  Genitourinary: Negative for difficulty urinating and dysuria.  Musculoskeletal: Negative.   Neurological: Negative for dizziness, light-headedness and numbness.  Psychiatric/Behavioral: Negative.    Per HPI unless specifically indicated above     Objective:    BP 120/75 (BP Location: Left Arm, Patient Position: Sitting, Cuff  Size: Large)   Pulse (!) 56   Temp 97.5 F (36.4 C) (Oral)   Resp 16   Ht  (1.676 m)   Wt 207 lb (93.9 kg)   BMI 33.41 kg/m   Wt Readings from Last 3 Encounters:  04/18/16 207 lb (93.9 kg)  01/14/16 210 lb 6.4 oz (95.4 kg)  01/04/16 208 lb (94.3 kg)    Physical Exam  Constitutional: She is oriented to person, place, and time. She appears well-developed and well-nourished.  HENT:  Head: Normocephalic and atraumatic.  Neck: Neck supple.  Cardiovascular: Normal rate, regular rhythm and  normal heart sounds.  Exam reveals no gallop and no friction rub.   No murmur heard. Pulmonary/Chest: Effort normal and breath sounds normal. She has no wheezes. She exhibits no tenderness.  Abdominal: Soft. Normal appearance and bowel sounds are normal. She exhibits no distension and no mass. There is no tenderness. There is no rebound and no guarding.  Musculoskeletal: Normal range of motion. She exhibits no edema or tenderness.  Lymphadenopathy:    She has no cervical adenopathy.  Neurological: She is alert and oriented to person, place, and time.  Skin: Skin is warm and dry.   Diabetic Foot Exam - Simple   Simple Foot Form Diabetic Foot exam was performed with the following findings:  Yes 04/18/2016 12:19 PM  Visual Inspection No deformities, no ulcerations, no other skin breakdown bilaterally:  Yes Sensation Testing Intact to touch and monofilament testing bilaterally:  Yes Pulse Check Posterior Tibialis and Dorsalis pulse intact bilaterally:  Yes Comments     Results for orders placed or performed in visit on 04/18/16  POCT HgB A1C  Result Value Ref Range   Hemoglobin A1C 6.8%   POCT UA - Microalbumin  Result Value Ref Range   Microalbumin Ur, POC negative mg/L   Creatinine, POC  mg/dL   Albumin/Creatinine Ratio, Urine, POC        Assessment & Plan:   Problem List Items Addressed This Visit      Cardiovascular and Mediastinum   HBP (high blood pressure)    Controlled in office today. Last kidney function was normal. Plan to recheck BMP next visit. On ARB for renal protection.         Endocrine   Diabetes mellitus type 2, controlled, without complications (HCC) - Primary    A1c elevated to 6.8%. Will plan to restart metformin once daily for sugar control. Reviewed diet and lifestyle changes.  UA micro done. Foot exam done. Eye exam- UTD- regular opthalmology follow for glaucoma.  RTC 3 mos.       Relevant Medications   metFORMIN (GLUCOPHAGE) 500 MG tablet    Other Relevant Orders   POCT HgB A1C (Completed)   POCT UA - Microalbumin (Completed)    Other Visit Diagnoses   None.     Meds ordered this encounter  Medications  . metFORMIN (GLUCOPHAGE) 500 MG tablet    Sig: Take 1 tablet (500 mg total) by mouth daily with breakfast.    Dispense:  90 tablet    Refill:  3    Order Specific Question:   Supervising Provider    Answer:   Janeann Forehand [[id number]]      Follow up plan: Return in about 3 months (around 07/19/2016) for diabetes. [name]

## 2016-04-18 NOTE — Assessment & Plan Note (Signed)
A1c elevated to 6.8%. Will plan to restart metformin once daily for sugar control. Reviewed diet and lifestyle changes.  UA micro done. Foot exam done. Eye exam- UTD- regular opthalmology follow for glaucoma.  RTC 3 mos.

## 2016-04-18 NOTE — Patient Instructions (Signed)
Let's start your metformin back today just once daily to help control your sugars.   Your goal blood pressure is 140/90. Work on low salt/sodium diet - goal <1.5gm (1,500mg ) per day. Eat a diet high in fruits/vegetables and whole grains.  Look into mediterranean and DASH diet. Goal activity is 111min/wk of moderate intensity exercise.  This can be split into 30 minute chunks.  If you are not at this level, you can start with smaller 10-15 min increments and slowly build up activity. Look at www.heart.org for more resources

## 2016-04-18 NOTE — Assessment & Plan Note (Signed)
Controlled in office today. Last kidney function was normal. Plan to recheck BMP next visit. On ARB for renal protection.

## 2016-07-13 ENCOUNTER — Other Ambulatory Visit: Payer: Self-pay | Admitting: Family Medicine

## 2016-07-13 DIAGNOSIS — J302 Other seasonal allergic rhinitis: Secondary | ICD-10-CM

## 2016-07-25 ENCOUNTER — Ambulatory Visit: Payer: Self-pay | Admitting: Family Medicine

## 2016-10-06 ENCOUNTER — Ambulatory Visit (INDEPENDENT_AMBULATORY_CARE_PROVIDER_SITE_OTHER): Payer: Commercial Managed Care - HMO | Admitting: Family Medicine

## 2016-10-06 ENCOUNTER — Encounter: Payer: Self-pay | Admitting: Family Medicine

## 2016-10-06 VITALS — BP 134/84 | HR 70 | Temp 98.6°F | Resp 16 | Ht 66.0 in | Wt 213.0 lb

## 2016-10-06 DIAGNOSIS — J431 Panlobular emphysema: Secondary | ICD-10-CM | POA: Diagnosis not present

## 2016-10-06 DIAGNOSIS — I10 Essential (primary) hypertension: Secondary | ICD-10-CM

## 2016-10-06 DIAGNOSIS — Z23 Encounter for immunization: Secondary | ICD-10-CM | POA: Diagnosis not present

## 2016-10-06 DIAGNOSIS — E119 Type 2 diabetes mellitus without complications: Secondary | ICD-10-CM

## 2016-10-06 LAB — POCT GLYCOSYLATED HEMOGLOBIN (HGB A1C)

## 2016-10-06 MED ORDER — CARVEDILOL 25 MG PO TABS: 25.0000 mg | ORAL_TABLET | Freq: Two times a day (BID) | ORAL | 3 refills | Status: DC

## 2016-10-06 NOTE — Progress Notes (Signed)
Name: Sharon Henderson   MRN: [id number]    DOB: 1940/10/20   Date:10/06/2016       Progress Note  Subjective  Chief Complaint  Chief Complaint  Patient presents with  . Hypertension  . Diabetes    HPI Here for f/u of DM and HBP.  Taking all meds.  Also with COPD and GERD.  Doing fairly well overall.  No problem-specific Assessment & Plan notes found for this encounter.   Past Medical History:  Diagnosis Date  . Acute respiratory failure (HCC)    secondary to asthma  . Allergic rhinitis   . Asthma   . Diabetes (HCC)    type 2  . Diverticulosis   . Hyperlipidemia   . Hypertension   . Internal hemorrhoids     Past Surgical History:  Procedure Laterality Date  . CATARACT EXTRACTION    . COLONOSCOPY      Family History  Problem Relation Age of Onset  . Asthma Mother     Social History   Social History  . Marital status: Widowed    Spouse name: N/A  . Number of children: N/A  . Years of education: N/A   Occupational History  . Not on file.   Social History Main Topics  . Smoking status: Never Smoker  . Smokeless tobacco: Never Used  . Alcohol use No  . Drug use: No  . Sexual activity: Not on file   Other Topics Concern  . Not on file   Social History Narrative  . No narrative on file     Current Outpatient Prescriptions:  .  acetaminophen (TYLENOL) 500 MG tablet, Take 1,000 mg by mouth every 8 (eight) hours as needed., Disp: , Rfl:  .  albuterol (PROAIR HFA) 108 (90 Base) MCG/ACT inhaler, Inhale 2 puffs into the lungs every 6 (six) hours as needed for wheezing or shortness of breath. Due to insurance please dispense as Ventolin HFA same directions., Disp: 8.5 Inhaler, Rfl: 6 .  amLODipine (NORVASC) 10 MG tablet, Take 1 tablet (10 mg total) by mouth daily., Disp: 90 tablet, Rfl: 3 .  aspirin EC 81 MG tablet, Take by mouth., Disp: , Rfl:  .  beclomethasone (QVAR) 40 MCG/ACT inhaler, Inhale 2 puffs into the lungs 2 (two) times daily. Discontinue, Disp: ,  Rfl:  .  cetirizine (ZYRTEC) 10 MG tablet, Take 1 tablet (10 mg total) by mouth daily., Disp: 90 tablet, Rfl: 3 .  diphenhydrAMINE (BENADRYL) 25 MG tablet, Take 25 mg by mouth every 6 (six) hours as needed., Disp: , Rfl:  .  fluticasone (FLONASE) 50 MCG/ACT nasal spray, PLACE 1-2 SPRAYS INTO BOTH NOSTRILS DAILY., Disp: 16 g, Rfl: 3 .  furosemide (LASIX) 40 MG tablet, Take 1 tablet twice a day except with extra swelling, then take 2 tabs in AM and 1 in early afternoon.  Take extra tabs no more than 5 days., Disp: 70 tablet, Rfl: 12 .  ibuprofen (ADVIL,MOTRIN) 200 MG tablet, Take by mouth., Disp: , Rfl:  .  ipratropium-albuterol (DUONEB) 0.5-2.5 (3) MG/3ML SOLN, INHALE CONTENTS OF 1 VIAL VIA NEBULIZER THREE TIMES A DAY, Disp: , Rfl:  .  losartan (COZAAR) 100 MG tablet, Take 1 tablet (100 mg total) by mouth daily., Disp: 90 tablet, Rfl: 3 .  metFORMIN (GLUCOPHAGE) 500 MG tablet, Take 1 tablet (500 mg total) by mouth daily with breakfast., Disp: 90 tablet, Rfl: 3 .  montelukast (SINGULAIR) 10 MG tablet, TAKE 1 TABLET (10 MG TOTAL) BY MOUTH  NIGHTLY., Disp: 30 tablet, Rfl: 11 .  omeprazole (PRILOSEC) 20 MG capsule, TAKE ONE CAPSULE BY MOUTH TWICE A DAY, Disp: 180 capsule, Rfl: 6 .  SPIRIVA HANDIHALER 18 MCG inhalation capsule, PLACE 1 CAPSULE (18 MCG TOTAL) INTO INHALER AND INHALE ONCE DAILY., Disp: 30 capsule, Rfl: 6 .  SYMBICORT 160-4.5 MCG/ACT inhaler, INHALE 2 INHALATIONS INTO THE LUNGS 2 (TWO) TIMES DAILY., Disp: 10.2 Inhaler, Rfl: 6 .  carvedilol (COREG) 25 MG tablet, Take 1 tablet (25 mg total) by mouth 2 (two) times daily with a meal., Disp: 60 tablet, Rfl: 3 .  latanoprost (XALATAN) 0.005 % ophthalmic solution, TAKE 1 DROP(S) IN BOTH EYES ONCE IN THE EVENING, Disp: , Rfl: 5  Allergies  Allergen Reactions  . Other Anaphylaxis    shellfish   . Shellfish Allergy Other (See Comments)  . Mold Extract [Trichophyton] Other (See Comments)     Review of Systems  Constitutional: Negative for  chills, fever, malaise/fatigue and weight loss.  HENT: Negative for hearing loss and tinnitus.   Eyes: Negative for blurred vision and double vision.  Respiratory: Positive for cough (occ.), sputum production (rare) and wheezing (occ.). Negative for shortness of breath.   Cardiovascular: Negative for chest pain, palpitations and leg swelling.  Gastrointestinal: Negative for abdominal pain, blood in stool and heartburn.  Genitourinary: Negative for dysuria, frequency and urgency.  Musculoskeletal: Negative for joint pain and myalgias.  Skin: Negative for rash.  Neurological: Negative for dizziness, tingling, tremors, weakness and headaches.      Objective  Vitals:   10/06/16 1024  BP: 134/84  Pulse: 70  Resp: 16  Temp: 98.6 F (37 C)  TempSrc: Oral  Weight: 213 lb (96.6 kg)  Height: 5\' 6"  (1.676 m)    Physical Exam  Constitutional: She is oriented to person, place, and time and well-developed, well-nourished, and in no distress. No distress.  HENT:  Head: Normocephalic and atraumatic.  Eyes: Conjunctivae and EOM are normal. Pupils are equal, round, and reactive to light. No scleral icterus.  Neck: Normal range of motion. Neck supple. Carotid bruit is not present. No thyromegaly present.  Cardiovascular: Normal rate, regular rhythm and normal heart sounds.  Exam reveals no gallop and no friction rub.   No murmur heard. Pulmonary/Chest: Effort normal. No respiratory distress. She has wheezes (faint end exp wheeze). She has no rales.  Musculoskeletal: She exhibits edema (trace bilateral pedal edema.).  Lymphadenopathy:    She has no cervical adenopathy.  Neurological: She is alert and oriented to person, place, and time.  Vitals reviewed.      Recent Results (from the past 2160 hour(s))  POCT HgB A1C     Status: Abnormal   Collection Time: 10/06/16 10:55 AM  Result Value Ref Range   Hemoglobin A1C 6.9%      Assessment & Plan  Problem List Items Addressed This  Visit      Cardiovascular and Mediastinum   HBP (high blood pressure)   Relevant Medications   carvedilol (COREG) 25 MG tablet   Other Relevant Orders   COMPLETE METABOLIC PANEL WITH GFR     Respiratory   COPD (chronic obstructive pulmonary disease) (HCC)   Relevant Orders   CBC with Differential     Endocrine   Diabetes mellitus type 2, controlled, without complications (HCC)   Relevant Orders   Lipid Profile    Other Visit Diagnoses    Diabetes mellitus without complication (HCC)    -  Primary   Relevant Orders  POCT HgB A1C (Completed)   Need for immunization against influenza       Relevant Orders   Flu vaccine HIGH DOSE PF (Fluzone High dose) (Completed)      Meds ordered this encounter  Medications  . carvedilol (COREG) 25 MG tablet    Sig: Take 1 tablet (25 mg total) by mouth 2 (two) times daily with a meal.    Dispense:  60 tablet    Refill:  3  1. Diabetes mellitus without complication (HCC)  - POCT HgB A1C-6.9 Cont Metformin 2. Need for immunization against influenza  - Flu vaccine HIGH DOSE PF (Fluzone High dose)  3. Essential hypertension Cont others except discontinue Metoprolol - carvedilol (COREG) 25 MG tablet; Take 1 tablet (25 mg total) by mouth 2 (two) times daily with a meal.  Dispense: 60 tablet; Refill: 3 - COMPLETE METABOLIC PANEL WITH GFR  4. Panlobular emphysema (HCC) cont inhalers - CBC with Differential  5. Controlled type 2 diabetes mellitus without complication, without long-term current use of insulin (HCC) - Lipid Profile Discussed diet and exercise.

## 2016-10-11 ENCOUNTER — Other Ambulatory Visit: Payer: Self-pay

## 2016-10-11 DIAGNOSIS — E119 Type 2 diabetes mellitus without complications: Secondary | ICD-10-CM | POA: Diagnosis not present

## 2016-10-11 DIAGNOSIS — I10 Essential (primary) hypertension: Secondary | ICD-10-CM | POA: Diagnosis not present

## 2016-10-11 DIAGNOSIS — J431 Panlobular emphysema: Secondary | ICD-10-CM | POA: Diagnosis not present

## 2016-10-11 LAB — CBC WITH DIFFERENTIAL/PLATELET
BASOS PCT: 1 %
Basophils Absolute: 78 cells/uL (ref 0–200)
EOS PCT: 4 %
Eosinophils Absolute: 312 cells/uL (ref 15–500)
HCT: 37.4 % (ref 35.0–45.0)
Hemoglobin: 12 g/dL (ref 11.7–15.5)
LYMPHS PCT: 22 %
Lymphs Abs: 1716 cells/uL (ref 850–3900)
MCH: 30.2 pg (ref 27.0–33.0)
MCHC: 32.1 g/dL (ref 32.0–36.0)
MCV: 94.2 fL (ref 80.0–100.0)
MONOS PCT: 5 %
MPV: 10.2 fL (ref 7.5–12.5)
Monocytes Absolute: 390 cells/uL (ref 200–950)
NEUTROS PCT: 68 %
Neutro Abs: 5304 cells/uL (ref 1500–7800)
PLATELETS: 237 10*3/uL (ref 140–400)
RBC: 3.97 MIL/uL (ref 3.80–5.10)
RDW: 13.9 % (ref 11.0–15.0)
WBC: 7.8 10*3/uL (ref 3.8–10.8)

## 2016-10-12 LAB — COMPLETE METABOLIC PANEL WITH GFR
ALBUMIN: 4.1 g/dL (ref 3.6–5.1)
ALK PHOS: 69 U/L (ref 33–130)
ALT: 8 U/L (ref 6–29)
AST: 11 U/L (ref 10–35)
BUN: 20 mg/dL (ref 7–25)
CALCIUM: 9.2 mg/dL (ref 8.6–10.4)
CHLORIDE: 103 mmol/L (ref 98–110)
CO2: 28 mmol/L (ref 20–31)
CREATININE: 1.34 mg/dL — AB (ref 0.60–0.93)
GFR, Est African American: 44 mL/min — ABNORMAL LOW (ref >=60)
GFR, Est Non African American: 38 mL/min — ABNORMAL LOW (ref >=60)
Glucose, Bld: 177 mg/dL — ABNORMAL HIGH (ref 65–99)
Potassium: 4 mmol/L (ref 3.5–5.3)
Sodium: 141 mmol/L (ref 135–146)
Total Bilirubin: 0.3 mg/dL (ref 0.2–1.2)
Total Protein: 6.6 g/dL (ref 6.1–8.1)

## 2016-10-12 LAB — LIPID PANEL
Cholesterol: 206 mg/dL — ABNORMAL HIGH (ref <200)
HDL: 61 mg/dL (ref >50)
LDL Cholesterol: 128 mg/dL — ABNORMAL HIGH (ref <100)
TRIGLYCERIDES: 87 mg/dL (ref <150)
Total CHOL/HDL Ratio: 3.4 Ratio (ref <5.0)
VLDL: 17 mg/dL (ref <30)

## 2016-10-14 ENCOUNTER — Other Ambulatory Visit: Payer: Self-pay | Admitting: Family Medicine

## 2016-10-14 MED ORDER — SIMVASTATIN 10 MG PO TABS: 10.0000 mg | ORAL_TABLET | Freq: Every day | ORAL | 11 refills | Status: DC

## 2016-10-22 ENCOUNTER — Other Ambulatory Visit: Payer: Self-pay | Admitting: Family Medicine

## 2016-10-22 DIAGNOSIS — I10 Essential (primary) hypertension: Secondary | ICD-10-CM

## 2016-10-31 ENCOUNTER — Other Ambulatory Visit: Payer: Self-pay | Admitting: Family Medicine

## 2016-10-31 MED ORDER — BUDESONIDE-FORMOTEROL FUMARATE 160-4.5 MCG/ACT IN AERO: INHALATION_SPRAY | RESPIRATORY_TRACT | 6 refills | Status: DC

## 2016-11-15 ENCOUNTER — Other Ambulatory Visit: Payer: Self-pay | Admitting: Family Medicine

## 2016-11-15 DIAGNOSIS — I10 Essential (primary) hypertension: Secondary | ICD-10-CM

## 2016-12-08 ENCOUNTER — Other Ambulatory Visit: Payer: Self-pay | Admitting: Family Medicine

## 2016-12-08 DIAGNOSIS — I10 Essential (primary) hypertension: Secondary | ICD-10-CM

## 2016-12-08 MED ORDER — LOSARTAN POTASSIUM 100 MG PO TABS: 100.0000 mg | ORAL_TABLET | Freq: Every day | ORAL | 3 refills | Status: DC

## 2016-12-09 DIAGNOSIS — R1312 Dysphagia, oropharyngeal phase: Secondary | ICD-10-CM | POA: Diagnosis not present

## 2016-12-09 DIAGNOSIS — J449 Chronic obstructive pulmonary disease, unspecified: Secondary | ICD-10-CM | POA: Diagnosis not present

## 2016-12-09 DIAGNOSIS — J301 Allergic rhinitis due to pollen: Secondary | ICD-10-CM | POA: Diagnosis not present

## 2016-12-09 DIAGNOSIS — J4540 Moderate persistent asthma, uncomplicated: Secondary | ICD-10-CM | POA: Diagnosis not present

## 2016-12-22 ENCOUNTER — Other Ambulatory Visit: Payer: Self-pay | Admitting: Family Medicine

## 2016-12-22 DIAGNOSIS — K224 Dyskinesia of esophagus: Secondary | ICD-10-CM | POA: Diagnosis not present

## 2016-12-22 DIAGNOSIS — R1312 Dysphagia, oropharyngeal phase: Secondary | ICD-10-CM | POA: Diagnosis not present

## 2017-01-17 ENCOUNTER — Other Ambulatory Visit: Payer: Self-pay

## 2017-01-17 ENCOUNTER — Encounter: Payer: Self-pay | Admitting: Family Medicine

## 2017-01-17 ENCOUNTER — Ambulatory Visit (INDEPENDENT_AMBULATORY_CARE_PROVIDER_SITE_OTHER): Payer: Medicare HMO | Admitting: Family Medicine

## 2017-01-17 VITALS — BP 122/81 | HR 70 | Temp 98.1°F | Resp 16 | Ht 66.0 in | Wt 209.8 lb

## 2017-01-17 DIAGNOSIS — E1169 Type 2 diabetes mellitus with other specified complication: Secondary | ICD-10-CM

## 2017-01-17 DIAGNOSIS — E785 Hyperlipidemia, unspecified: Secondary | ICD-10-CM

## 2017-01-17 DIAGNOSIS — Z23 Encounter for immunization: Secondary | ICD-10-CM

## 2017-01-17 DIAGNOSIS — N183 Chronic kidney disease, stage 3 unspecified: Secondary | ICD-10-CM

## 2017-01-17 DIAGNOSIS — E1121 Type 2 diabetes mellitus with diabetic nephropathy: Secondary | ICD-10-CM

## 2017-01-17 DIAGNOSIS — J4489 Other specified chronic obstructive pulmonary disease: Secondary | ICD-10-CM

## 2017-01-17 DIAGNOSIS — K219 Gastro-esophageal reflux disease without esophagitis: Secondary | ICD-10-CM

## 2017-01-17 DIAGNOSIS — R600 Localized edema: Secondary | ICD-10-CM | POA: Diagnosis not present

## 2017-01-17 DIAGNOSIS — J449 Chronic obstructive pulmonary disease, unspecified: Secondary | ICD-10-CM | POA: Diagnosis not present

## 2017-01-17 DIAGNOSIS — I10 Essential (primary) hypertension: Secondary | ICD-10-CM | POA: Diagnosis not present

## 2017-01-17 LAB — POCT GLYCOSYLATED HEMOGLOBIN (HGB A1C): HEMOGLOBIN A1C: 6.9

## 2017-01-17 NOTE — Assessment & Plan Note (Signed)
Stable CKD, secondary to likely HTN, DM, Age Control BP, A1c Improve hydration Avoid NSAIDs Follow-up as needed, monitor Cr trend

## 2017-01-17 NOTE — Assessment & Plan Note (Signed)
Well-controlled HTN  Complicated by CKD-III  Plan:  1. Discontinue Amlodipine  daily due to controlled BP and significant LE edema likely side effect. 2. May continue Furosemide  BID for now, once swelling improves if it does not return as significantly off Amlodipine, after about 1 week reduce dose to  (half tab ) BID, then may use only PRN if still improved 3. Discontinue Carvedilol BID. Continue Metoprolol  BID 4. Continue Losartan  daily 5. Consider check BMET for Cr trend in 3 months after med adjust 6. Improve low sodium diet, regular exercise 7. Monitor BP closely outside office / at home for now after med changes 8. Follow-up 3 months HTN

## 2017-01-17 NOTE — Assessment & Plan Note (Signed)
Resolved now >6 months off PPI, improved with diet modifications Had been on Omeprazole PPI  BID for 1.5 years Follow-up as needed

## 2017-01-17 NOTE — Progress Notes (Signed)
Subjective:    Patient ID: Sharon Henderson, female    DOB: June 04, 1941, 76 y.o.   MRN: [id number]  Sharon Henderson is a 76 y.o. female presenting on 01/17/2017 for Diabetes   HPI   Today has paperwork for [facility] rx request. All meds 30 day supply to be faxed once completed.  CHRONIC DM, Type 2 with CKD-III Reports no concerns, A1c today is similar to prior results. Meds: Metformin  once daily with food (thinks she was on higher dose years ago, but has reduced down to once daily Reports good compliance. Tolerating well w/o side-effects Currently on ARB, Statin, Aspirin 81 Lifestyle: Diet (follows DM diet with low carb, avoids bread and starches, drinks mostly water) / Exercise (lives in retirement home, she used to walk more, and unfortunately they did not establish an exercise program, so she will consider other local options) Denies hypoglycemia, polyuria, visual changes, numbness or tingling.  HYPERLIPIDEMIA: - Reports no concerns. Last lipid panel 10/11/16 with mild abnormal LDL but good HDL, was started on statin at that time.  - Currently taking Simvastatin  nightly, tolerating well without side effects or myalgias - Taking ASA  daily - Has never had prior vascular event such as MI or CVA  CHRONIC HTN, with Peripheral Lower Extremity Edema Reports does not have BP cuff anymore, has done well recently. Current BP is average for her. Current Meds - Amlodipine  daily, Carvedilol  BID, Losartan  , Metoprolol  BID. Med rec shows Carvedilol  BID (this was discontinued today, she is unsure if taking both, thinks only metoprolol) - Also takes Furosemide  BID everyday for about 1 year, occasionally has taken 2 tabs in AM and 1 in afternoon for bilateral ankle swelling and knee swelling Reports good compliance, took meds today. Tolerating well, w/o complaints. Denies CP, dyspnea, HA, dizziness / lightheadedness  COPD, Asthma  mixed / Seasonal Allergies - Reports chronic problem. Followed by Pulmonology Dr Ashley Royalty at [facility] - No recent flare - She takes Symbicort 2 puffs BID. Uses Albuterol PRN infrequently - Does not have nebulizer machine at home, request update med list - Takes daily Cetirizine , and Singulair nightly   GERD - Had been taking Omeprazole  BID for >1.5 years, then she stopped taking PPI in Fall 2017, and doing well, remains off this med - Denies heartburn, abdominal pain, nausea, vomiting  Social History  Substance Use Topics  . Smoking status: Never Smoker  . Smokeless tobacco: Never Used  . Alcohol use No    Review of Systems Per HPI unless specifically indicated above     Objective:    BP 122/81   Pulse 70   Temp 98.1 F (36.7 C) (Oral)   Resp 16   Ht  (1.676 m)   Wt 209 lb 12.8 oz (95.2 kg)   BMI 33.86 kg/m   Wt Readings from Last 3 Encounters:  01/17/17 209 lb 12.8 oz (95.2 kg)  10/06/16 213 lb (96.6 kg)  04/18/16 207 lb (93.9 kg)    Physical Exam  Constitutional: She is oriented to person, place, and time. She appears well-developed and well-nourished. No distress.  Well-appearing 76 year old female, comfortable, cooperative, very pleasant  HENT:  Head: Normocephalic and atraumatic.  Mouth/Throat: Oropharynx is clear and moist.  Eyes: Conjunctivae are normal.  Neck: Normal range of motion. Neck supple. No thyromegaly present.  Cardiovascular: Normal rate, regular rhythm, normal heart sounds and intact distal pulses.  No murmur heard. Pulmonary/Chest: Effort normal and breath sounds normal. No respiratory distress. She has no wheezes. She has no rales.  Abdominal: Soft. Bowel sounds are normal. She exhibits no distension. There is no tenderness.  Musculoskeletal: She exhibits edema (Significant non pitting edema bilateral ankles and feet L > R, some upper leg and L knee soft tissue edema).  Lymphadenopathy:    She has no cervical adenopathy.    Neurological: She is alert and oriented to person, place, and time.  Skin: Skin is warm and dry. She is not diaphoretic.  Psychiatric: She has a normal mood and affect. Her behavior is normal.  Well groomed, good eye contact, normal speech and thoughts  Nursing note and vitals reviewed.  Results for orders placed or performed in visit on 01/17/17  POCT HgB A1C  Result Value Ref Range   Hemoglobin A1C 6.9       Assessment & Plan:   Problem List Items Addressed This Visit    Well controlled type 2 diabetes mellitus with nephropathy (HCC) - Primary    Well-controlled DM, A1c 6.9, relatively stable remains < 7.0 Complications - nephropathy, also with HTN  Plan:  1. Continue current therapy Metformin  daily in AM - consider future change to Metformin XR 500-750 daily 2. Lifestyle Mods - continue DM diet, improve regular exercise with plans to join gym (limited access at retirement community) 3. Received PNA vaccine, [id number] today, then next 1 year Pneumovax 23 4. Continue ASA, statin, ARB 5. Follow-up 3 months A1c      Relevant Orders   POCT HgB A1C (Completed)   Hyperlipidemia associated with type 2 diabetes mellitus (HCC)    Stable, last lipids 09/2016, mild abnormal LDL, normal HDL Tolerating statin without myalgia Continue Simvastatin  daily, ASA  daily Follow-up in future yearly lipids 09/2017, calculate ASCVD risk, adjust intensity as needed      GERD (gastroesophageal reflux disease)    Resolved now >6 months off PPI, improved with diet modifications Had been on Omeprazole PPI  BID for 1.5 years Follow-up as needed      Essential hypertension    Well-controlled HTN  Complicated by CKD-III  Plan:  1. Discontinue Amlodipine  daily due to controlled BP and significant LE edema likely side effect. 2. May continue Furosemide  BID for now, once swelling improves if it does not return as significantly off Amlodipine, after about 1 week reduce dose  to  (half tab ) BID, then may use only PRN if still improved 3. Discontinue Carvedilol BID. Continue Metoprolol  BID 4. Continue Losartan  daily 5. Consider check BMET for Cr trend in 3 months after med adjust 6. Improve low sodium diet, regular exercise 7. Monitor BP closely outside office / at home for now after med changes 8. Follow-up 3 months HTN      CKD (chronic kidney disease), stage III    Stable CKD, secondary to likely HTN, DM, Age Control BP, A1c Improve hydration Avoid NSAIDs Follow-up as needed, monitor Cr trend      Bilateral lower extremity edema    Stable chronic problem now >1 year, seems to be related to some medications, possibly side effect amlodipine. Has been managed with diuretic lasix for relief  Plan: 1. Discontinue Amlodipine  daily due to controlled BP and significant LE edema likely side effect. 2. May continue Furosemide  BID for now, once swelling improves if it does not return as significantly off Amlodipine, after about 1 week reduce dose  to  (half tab ) BID, then may use only PRN if still improved 3. Reviewed RICE therapy, likely some degree of chronic venous stasis present 4. Future follow-up consider ECHO if persistent swelling or other concerns      Asthma-chronic obstructive pulmonary disease overlap syndrome (HCC)    Stable chronic mixed asthma / COPD in patient with environmental allergies but never smoker. Followed by Pulm ([facility] Dr Ashley Royalty) Controlled on Symbicort. Appropriate Albuterol use. Continue Singulair, Cetirizine Follow-up as needed       Other Visit Diagnoses    Need for diphtheria-tetanus-pertussis (Tdap) vaccine       Relevant Orders   Tdap vaccine greater than or equal to 7yo IM (Completed)   Need for pneumococcal vaccination       Now due for pneumovax 23 in 1 year 01/17/18   Relevant Orders   Pneumococcal conjugate vaccine 13-valent IM (Completed)      No orders of the defined types  were placed in this encounter.   Follow up plan: Return in about 3 months (around 04/18/2017) for 3 months for HTN, DM A1c, Meds.  Saralyn Pilar, DO [facility] 01/17/2017, 11:30 PM

## 2017-01-17 NOTE — Assessment & Plan Note (Signed)
Well-controlled DM, A1c 6.9, relatively stable remains < 7.0 Complications - nephropathy, also with HTN  Plan:  1. Continue current therapy Metformin  daily in AM - consider future change to Metformin XR 500-750 daily 2. Lifestyle Mods - continue DM diet, improve regular exercise with plans to join gym (limited access at retirement community) 3. Received PNA vaccine, prevnar13 today, then next 1 year Pneumovax 23 4. Continue ASA, statin, ARB 5. Follow-up 3 months A1c

## 2017-01-17 NOTE — Assessment & Plan Note (Signed)
Stable, last lipids 09/2016, mild abnormal LDL, normal HDL Tolerating statin without myalgia Continue Simvastatin  daily, ASA  daily Follow-up in future yearly lipids 09/2017, calculate ASCVD risk, adjust intensity as needed

## 2017-01-17 NOTE — Assessment & Plan Note (Signed)
Stable chronic mixed asthma / COPD in patient with environmental allergies but never smoker. Followed by Pulm ([facility]) Controlled on Symbicort. Appropriate Albuterol use. Continue Singulair, Cetirizine Follow-up as needed

## 2017-01-17 NOTE — Assessment & Plan Note (Addendum)
Stable chronic problem now >1 year, seems to be related to some medications, possibly side effect amlodipine. Has been managed with diuretic lasix for relief  Plan: 1. Discontinue Amlodipine  daily due to controlled BP and significant LE edema likely side effect. 2. May continue Furosemide  BID for now, once swelling improves if it does not return as significantly off Amlodipine, after about 1 week reduce dose to  (half tab ) BID, then may use only PRN if still improved 3. Reviewed RICE therapy, likely some degree of chronic venous stasis present 4. Future follow-up consider ECHO if persistent swelling or other concerns

## 2017-01-17 NOTE — Patient Instructions (Addendum)
Thank you for coming in to clinic today.  1. A1c 6.9, good result, Diabetes is well controlled - Keep up good work with healthy diet and try to improve exercise and stay active as you are  2. Medication changes - STOP taking Amlodipine  daily (blood pressure) - this may have been causing some of your swelling - You may continue Furosemide (lasix) fluid pill for now - but after about 1 week then I would recommend using HALF dose, so half pill for dose of  TWICE a day. Then you MAY be able to use it ONLY as needed if swelling. - Try to elevate legs and use compression stockings, elevate above heart level, "toes above the nose" use pillows  - STOP taking Carvedilol (Coreg) twice daily - (blood pressure) - Continue taking Metoprolol  twice daily (blood pressure)  [facility] refill request [facility]  Please schedule a follow-up appointment with Dr. Althea Charon in 3 months for HTN, DM A1c, Meds  If you have any other questions or concerns, please feel free to call the clinic or send a message through MyChart. You may also schedule an earlier appointment if necessary.  Saralyn Pilar, DO [facility], [place]

## 2017-02-28 ENCOUNTER — Telehealth: Payer: Self-pay | Admitting: Family Medicine

## 2017-02-28 NOTE — Telephone Encounter (Signed)
Called pt to schedule Annual Wellness Visit with Nurse Health Advisor for July:  - knb ° °

## 2017-03-21 ENCOUNTER — Other Ambulatory Visit: Payer: Self-pay

## 2017-03-21 DIAGNOSIS — R600 Localized edema: Secondary | ICD-10-CM

## 2017-03-21 MED ORDER — FUROSEMIDE 40 MG PO TABS: ORAL_TABLET | ORAL | 5 refills | Status: DC

## 2017-04-04 ENCOUNTER — Ambulatory Visit (INDEPENDENT_AMBULATORY_CARE_PROVIDER_SITE_OTHER): Payer: Medicare HMO

## 2017-04-04 VITALS — BP 118/64 | HR 82 | Temp 98.1°F | Resp 17 | Ht 64.0 in | Wt 211.0 lb

## 2017-04-04 DIAGNOSIS — Z0000 Encounter for general adult medical examination without abnormal findings: Secondary | ICD-10-CM

## 2017-04-04 DIAGNOSIS — Z13820 Encounter for screening for osteoporosis: Secondary | ICD-10-CM | POA: Diagnosis not present

## 2017-04-04 NOTE — Patient Instructions (Signed)
Sharon Henderson , Thank you for taking time to come for your Medicare Wellness Visit. I appreciate your ongoing commitment to your health goals. Please review the following plan we discussed and let me know if I can assist you in the future.   Screening recommendations/referrals: Colonoscopy: no longer required Mammogram: no longer required Bone Density: due Recommended yearly ophthalmology/optometry visit for glaucoma screening and checkup Recommended yearly dental visit for hygiene and checkup  Vaccinations: Influenza vaccine: up to date Pneumococcal vaccine: up to date  Tdap vaccine: up to date Shingles vaccine: due, check with your insurance company for coverage  Advanced directives: Advance directive discussed with you today. I have provided a copy for you to complete at home and have notarized. Once this is complete please bring a copy in to our office so we can scan it into your chart.  Conditions/risks identified: Recommend drinking at least 4-5 glasses of water a day   Next appointment: Follow up on 04/20/2017 at 10:40am with Dr.Karamalegos. Follow up in one year for your annual wellness exam.   Preventive Care 65 Years and Older, Female Preventive care refers to lifestyle choices and visits with your health care provider that can promote health and wellness. What does preventive care include?  A yearly physical exam. This is also called an annual well check.  Dental exams once or twice a year.  Routine eye exams. Ask your health care provider how often you should have your eyes checked.  Personal lifestyle choices, including:  Daily care of your teeth and gums.  Regular physical activity.  Eating a healthy diet.  Avoiding tobacco and drug use.  Limiting alcohol use.  Practicing safe sex.  Taking low-dose aspirin every day.  Taking vitamin and mineral supplements as recommended by your health care provider. What happens during an annual well check? The services and  screenings done by your health care provider during your annual well check will depend on your age, overall health, lifestyle risk factors, and family history of disease. Counseling  Your health care provider may ask you questions about your:  Alcohol use.  Tobacco use.  Drug use.  Emotional well-being.  Home and relationship well-being.  Sexual activity.  Eating habits.  History of falls.  Memory and ability to understand (cognition).  Work and work Astronomerenvironment.  Reproductive health. Screening  You may have the following tests or measurements:  Height, weight, and BMI.  Blood pressure.  Lipid and cholesterol levels. These may be checked every 5 years, or more frequently if you are over 78 years old.  Skin check.  Lung cancer screening. You may have this screening every year starting at age 78 if you have a 30-pack-year history of smoking and currently smoke or have quit within the past 15 years.  Fecal occult blood test (FOBT) of the stool. You may have this test every year starting at age 78.  Flexible sigmoidoscopy or colonoscopy. You may have a sigmoidoscopy every 5 years or a colonoscopy every 10 years starting at age 78.  Hepatitis C blood test.  Hepatitis B blood test.  Sexually transmitted disease (STD) testing.  Diabetes screening. This is done by checking your blood sugar (glucose) after you have not eaten for a while (fasting). You may have this done every 1-3 years.  Bone density scan. This is done to screen for osteoporosis. You may have this done starting at age 78.  Mammogram. This may be done every 1-2 years. Talk to your health care provider about  how often you should have regular mammograms. Talk with your health care provider about your test results, treatment options, and if necessary, the need for more tests. Vaccines  Your health care provider may recommend certain vaccines, such as:  Influenza vaccine. This is recommended every  year.  Tetanus, diphtheria, and acellular pertussis (Tdap, Td) vaccine. You may need a Td booster every 10 years.  Zoster vaccine. You may need this after age 34.  Pneumococcal 13-valent conjugate (PCV13) vaccine. One dose is recommended after age 106.  Pneumococcal polysaccharide (PPSV23) vaccine. One dose is recommended after age 33. Talk to your health care provider about which screenings and vaccines you need and how often you need them. This information is not intended to replace advice given to you by your health care provider. Make sure you discuss any questions you have with your health care provider. Document Released: 10/09/2015 Document Revised: 06/01/2016 Document Reviewed: 07/14/2015 Elsevier Interactive Patient Education  2017 [place] Prevention in the Home Falls can cause injuries. They can happen to people of all ages. There are many things you can do to make your home safe and to help prevent falls. What can I do on the outside of my home?  Regularly fix the edges of walkways and driveways and fix any cracks.  Remove anything that might make you trip as you walk through a door, such as a raised step or threshold.  Trim any bushes or trees on the path to your home.  Use bright outdoor lighting.  Clear any walking paths of anything that might make someone trip, such as rocks or tools.  Regularly check to see if handrails are loose or broken. Make sure that both sides of any steps have handrails.  Any raised decks and porches should have guardrails on the edges.  Have any leaves, snow, or ice cleared regularly.  Use sand or salt on walking paths during winter.  Clean up any spills in your garage right away. This includes oil or grease spills. What can I do in the bathroom?  Use night lights.  Install grab bars by the toilet and in the tub and shower. Do not use towel bars as grab bars.  Use non-skid mats or decals in the tub or shower.  If you  need to sit down in the shower, use a plastic, non-slip stool.  Keep the floor dry. Clean up any water that spills on the floor as soon as it happens.  Remove soap buildup in the tub or shower regularly.  Attach bath mats securely with double-sided non-slip rug tape.  Do not have throw rugs and other things on the floor that can make you trip. What can I do in the bedroom?  Use night lights.  Make sure that you have a light by your bed that is easy to reach.  Do not use any sheets or blankets that are too big for your bed. They should not hang down onto the floor.  Have a firm chair that has side arms. You can use this for support while you get dressed.  Do not have throw rugs and other things on the floor that can make you trip. What can I do in the kitchen?  Clean up any spills right away.  Avoid walking on wet floors.  Keep items that you use a lot in easy-to-reach places.  If you need to reach something above you, use a strong step stool that has a grab bar.  Keep  electrical cords out of the way.  Do not use floor polish or wax that makes floors slippery. If you must use wax, use non-skid floor wax.  Do not have throw rugs and other things on the floor that can make you trip. What can I do with my stairs?  Do not leave any items on the stairs.  Make sure that there are handrails on both sides of the stairs and use them. Fix handrails that are broken or loose. Make sure that handrails are as long as the stairways.  Check any carpeting to make sure that it is firmly attached to the stairs. Fix any carpet that is loose or worn.  Avoid having throw rugs at the top or bottom of the stairs. If you do have throw rugs, attach them to the floor with carpet tape.  Make sure that you have a light switch at the top of the stairs and the bottom of the stairs. If you do not have them, ask someone to add them for you. What else can I do to help prevent falls?  Wear shoes  that:  Do not have high heels.  Have rubber bottoms.  Are comfortable and fit you well.  Are closed at the toe. Do not wear sandals.  If you use a stepladder:  Make sure that it is fully opened. Do not climb a closed stepladder.  Make sure that both sides of the stepladder are locked into place.  Ask someone to hold it for you, if possible.  Clearly mark and make sure that you can see:  Any grab bars or handrails.  First and last steps.  Where the edge of each step is.  Use tools that help you move around (mobility aids) if they are needed. These include:  Canes.  Walkers.  Scooters.  Crutches.  Turn on the lights when you go into a dark area. Replace any light bulbs as soon as they burn out.  Set up your furniture so you have a clear path. Avoid moving your furniture around.  If any of your floors are uneven, fix them.  If there are any pets around you, be aware of where they are.  Review your medicines with your doctor. Some medicines can make you feel dizzy. This can increase your chance of falling. Ask your doctor what other things that you can do to help prevent falls. This information is not intended to replace advice given to you by your health care provider. Make sure you discuss any questions you have with your health care provider. Document Released: 07/09/2009 Document Revised: 02/18/2016 Document Reviewed: 10/17/2014 Elsevier Interactive Patient Education  2017 [employer].

## 2017-04-04 NOTE — Progress Notes (Signed)
Subjective:   Sharon Henderson is a 76 y.o. female who presents for Medicare Annual (Subsequent) preventive examination.  Review of Systems:   Cardiac Risk Factors include: advanced age (>1055men, 78>65 women);diabetes mellitus;dyslipidemia;hypertension;obesity (BMI >30kg/m2);smoking/ tobacco exposure     Objective:     Vitals: BP 118/64 (BP Location: Left Arm, Patient Position: Sitting)   Pulse 82   Temp 98.1 F (36.7 C)   Resp 17   Ht 5\' 4"  (1.626 m)   Wt 211 lb (95.7 kg)   BMI 36.22 kg/m   Body mass index is 36.22 kg/m.   Tobacco History  Smoking Status  . Never Smoker  Smokeless Tobacco  . Never Used     Counseling given: Not Answered   Past Medical History:  Diagnosis Date  . Acute respiratory failure (HCC)    secondary to asthma  . Allergic rhinitis   . Asthma   . Diabetes (HCC)    type 2  . Diverticulosis   . Hyperlipidemia   . Hypertension   . Internal hemorrhoids    Past Surgical History:  Procedure Laterality Date  . CATARACT EXTRACTION    . COLONOSCOPY     Family History  Problem Relation Age of Onset  . Asthma Mother   . Heart disease Mother   . Heart attack Father    History  Sexual Activity  . Sexual activity: Not on file    Outpatient Encounter Prescriptions as of 04/04/2017  Medication Sig  . acetaminophen (TYLENOL) 500 MG tablet Take 1,000 mg by mouth every 8 (eight) hours as needed.  [name]. albuterol (PROAIR HFA) 108 (90 Base) MCG/ACT inhaler Inhale 2 puffs into the lungs every 6 (six) hours as needed for wheezing or shortness of breath. Due to insurance please dispense as Ventolin HFA same directions.  [name]. amLODipine (NORVASC) 10 MG tablet TAKE 1 TABLET (10 MG TOTAL) BY MOUTH DAILY.  [name]. aspirin EC 81 MG tablet Take by mouth.  . budesonide-formoterol (SYMBICORT) 160-4.5 MCG/ACT inhaler INHALE 2 INHALATIONS INTO THE LUNGS 2 (TWO) TIMES DAILY.  . cetirizine (ZYRTEC) 10 MG tablet TAKE 1 TABLET (10 MG TOTAL) BY MOUTH DAILY.  . furosemide  (LASIX) 40 MG tablet Take 1 tablet twice a day except with extra swelling, then take 2 tabs in AM and 1 in early afternoon.  Take extra tabs no more than 5 days.  [name]. ibuprofen (ADVIL,MOTRIN) 200 MG tablet Take by mouth.  . losartan (COZAAR) 100 MG tablet Take 1 tablet (100 mg total) by mouth daily.  . metFORMIN (GLUCOPHAGE) 500 MG tablet Take 1 tablet (500 mg total) by mouth daily with breakfast.  . metoprolol (LOPRESSOR) 100 MG tablet TAKE 1 TABLET TWICE A DAY  . montelukast (SINGULAIR) 10 MG tablet TAKE 1 TABLET (10 MG TOTAL) BY MOUTH NIGHTLY. FOR ALLERGIES  . simvastatin (ZOCOR) 10 MG tablet Take 1 tablet (10 mg total) by mouth at bedtime.  . fluticasone (FLONASE) 50 MCG/ACT nasal spray PLACE 1-2 SPRAYS INTO BOTH NOSTRILS DAILY. (Patient not taking: Reported on 04/04/2017)  . [DISCONTINUED] latanoprost (XALATAN) 0.005 % ophthalmic solution TAKE 1 DROP(S) IN BOTH EYES ONCE IN THE EVENING   No facility-administered encounter medications on file as of 04/04/2017.     Activities of Daily Living In your present state of health, do you have any difficulty performing the following activities: 04/04/2017 04/18/2016  Hearing? Y N  Vision? Y N  Difficulty concentrating or making decisions? Y N  Walking or climbing stairs? Y N  Dressing  or bathing? N N  Doing errands, shopping? N N  Preparing Food and eating ? N -  Using the Toilet? N -  In the past six months, have you accidently leaked urine? N -  Do you have problems with loss of bowel control? N -  Managing your Medications? N -  Managing your Finances? N -  Housekeeping or managing your Housekeeping? N -  Some recent data might be hidden    Patient Care Team: Smitty Cords, DO as PCP - General (Family Medicine)    Assessment:     Exercise Activities and Dietary recommendations Current Exercise Habits: The patient does not participate in regular exercise at present, Exercise limited by: respiratory conditions(s)  Goals    .  Increase water intake          Recommend drinking at least 4-5 glasses of water a day       Fall Risk Fall Risk  04/04/2017 04/18/2016 01/14/2016 08/14/2015 05/05/2015  Falls in the past year? No No No No No   Depression Screen PHQ 2/9 Scores 04/04/2017 04/18/2016 01/14/2016 08/14/2015  PHQ - 2 Score 5 0 0 3  PHQ- 9 Score 7 - - 8     Cognitive Function     6CIT Screen 04/04/2017  What Year? 0 points  What month? 0 points  What time? 0 points  Count back from 20 0 points  Months in reverse 0 points  Repeat phrase 0 points  Total Score 0    Immunization History  Administered Date(s) Administered  . Influenza, High Dose Seasonal PF 08/14/2015, 10/06/2016  . Pneumococcal Conjugate-13 01/17/2017  . Tdap 01/17/2017   Screening Tests Health Maintenance  Topic Date Due  . DEXA SCAN  07/28/2004  . OPHTHALMOLOGY EXAM  06/26/2017 (Originally 06/26/2016)  . FOOT EXAM  04/18/2017  . INFLUENZA VACCINE  04/26/2017  . HEMOGLOBIN A1C  07/19/2017  . PNA vac Low Risk Adult (2 of 2 - PPSV23) 01/17/2018  . TETANUS/TDAP  01/18/2027      Plan:     I have personally reviewed and addressed the Medicare Annual Wellness questionnaire and have noted the following in the patient's chart:  A. Medical and social history B. Use of alcohol, tobacco or illicit drugs  C. Current medications and supplements D. Functional ability and status E.  Nutritional status F.  Physical activity G. Advance directives H. List of other physicians I.  Hospitalizations, surgeries, and ER visits in previous 12 months J.  Vitals K. Screenings such as hearing and vision if needed, cognitive and depression L. Referrals and appointments  In addition, I have reviewed and discussed with patient certain preventive protocols, quality metrics, and best practice recommendations. A written personalized care plan for preventive services as well as general preventive health recommendations were provided to patient.   Signed,    Marin Roberts, LPN Nurse Health Advisor   MD Recommendations: none

## 2017-04-20 ENCOUNTER — Encounter: Payer: Self-pay | Admitting: Family Medicine

## 2017-04-20 ENCOUNTER — Ambulatory Visit (INDEPENDENT_AMBULATORY_CARE_PROVIDER_SITE_OTHER): Payer: Medicare HMO | Admitting: Family Medicine

## 2017-04-20 VITALS — BP 133/89 | HR 67 | Temp 97.9°F | Resp 16 | Ht 66.0 in | Wt 212.0 lb

## 2017-04-20 DIAGNOSIS — E1121 Type 2 diabetes mellitus with diabetic nephropathy: Secondary | ICD-10-CM

## 2017-04-20 DIAGNOSIS — R600 Localized edema: Secondary | ICD-10-CM

## 2017-04-20 DIAGNOSIS — I10 Essential (primary) hypertension: Secondary | ICD-10-CM

## 2017-04-20 DIAGNOSIS — Z780 Asymptomatic menopausal state: Secondary | ICD-10-CM

## 2017-04-20 LAB — POCT GLYCOSYLATED HEMOGLOBIN (HGB A1C): HEMOGLOBIN A1C: 7.1 — AB (ref ?–5.7)

## 2017-04-20 NOTE — Assessment & Plan Note (Signed)
Stable chronic problem  Plan: 1. Should have DC'd Amlodipine 10mg  last time- does not have pill bottles, unsure if has done this 2. May try taper down Furosemide 40mg  BID vs half tab BID or PRN only, concern with CKD 3. Reviewed RICE therapy, likely some degree of chronic venous stasis present - emphasized degree of elevation, she was not elevating legs above heart level 4. Future follow-up consider ECHO if persistent swelling or other concerns

## 2017-04-20 NOTE — Patient Instructions (Addendum)
Thank you for coming to the clinic today.  1.  A1c is good 7.1, similar to last time  Double check Metformin 500mg  - see if once a day or twice a day - and check to see what you have been taking. - Let us know if we need to refill ONCE daily 90 in a bottle or TWICE daily for 180 in bottle  For DEXA Scan (Bone mineral density) screening for osteoporosis  Call the Imaging Center below anytime to schedule your own appointment now that order has been placed.  [facility] [address] [place], [place] [address] Phone: [phone]  [facility] [address] [place], [place] [address] Phone: [phone]  Use RICE therapy: - R - Rest / relative rest with activity modification avoid overuse of joint - I - Ice packs (make sure you use a towel or sock / something to protect skin) - C - Compression with ACE wrap to apply pressure and reduce swelling allowing more support - E - Elevation - if significant swelling, lift leg above heart level (toes above your nose) to help reduce swelling, most helpful at night after day of being on your feet  Reminder to follow-up with [facility], have them send us a copy of your eye report  Please schedule a Follow-up Appointment to: Return in about 3 months (around 07/21/2017) for diabetes.  If you have any other questions or concerns, please feel free to call the clinic or send a message through MyChart. You may also schedule an earlier appointment if necessary.  Additionally, you may be receiving a survey about your experience at our clinic within a few days to 1 week by e-mail or mail. We value your feedback.  Saralyn PilarAlexander Lillie Bollig, DO [facility], [place]

## 2017-04-20 NOTE — Progress Notes (Signed)
Subjective:    Patient ID: Sharon CoopShirley J Skalsky, female    DOB: 03/06/1941, 76 y.o.   MRN: [id number]  Sharon Henderson is a 76 y.o. female presenting on 04/20/2017 for Diabetes and Hypertension   HPI   CHRONIC DM, Type 2 with CKD-III Reports no concerns, A1c today is similar to prior results. Meds: Metformin 500mg  once daily with food - (however she believes she may still be on BID, needs to check bottle at home) Reports good compliance. Tolerating well w/o side-effects Currently on ARB, Statin, Aspirin 81 Lifestyle: - Diet (follows DM diet with low carb, avoids most carbs or limits portion, drinks mostly water) - Exercise (lives in retirement home, some walking, not daily) Due for DM Eye exam - [facility], she will try to schedule Denies hypoglycemia, polyuria, visual changes, numbness or tingling.  CHRONIC HTN, with Peripheral Lower Extremity Edema Not checking BP outside office. Last visit discontinued Amlodipine 10mg  and Carvedilol 25mg  BID Current Meds - Losartan 100mg  , Metoprolol 100mg  BID - did not bring pill bottles - Also takes Furosemide 40mg  BID everyday (she was asked to half pill or taper down, did not seem to adhere) Reports good compliance, took meds today. Tolerating well, w/o complaints. Denies CP, dyspnea, HA, dizziness / lightheadedness  Health Maintenance: - Due for DEXA Scan, [facility], reviewed rec with patient, and advised her to call and schedule this   Social History  Substance Use Topics  . Smoking status: Never Smoker  . Smokeless tobacco: Never Used  . Alcohol use No    Review of Systems Per HPI unless specifically indicated above     Objective:    BP 133/89   Pulse 67   Temp 97.9 F (36.6 C) (Oral)   Resp 16   Ht 5\' 6"  (1.676 m)   Wt 212 lb (96.2 kg)   BMI 34.22 kg/m   Wt Readings from Last 3 Encounters:  04/20/17 212 lb (96.2 kg)  04/04/17 211 lb (95.7 kg)  01/17/17 209 lb 12.8 oz (95.2 kg)      Physical Exam  Constitutional: She is oriented to person, place, and time. She appears well-developed and well-nourished. No distress.  Well-appearing [age] year old female, comfortable, cooperative, very pleasant  HENT:  Head: Normocephalic and atraumatic.  Mouth/Throat: Oropharynx is clear and moist.  Eyes: Conjunctivae are normal.  Neck: Normal range of motion. Neck supple. No thyromegaly present.  Cardiovascular: Normal rate, regular rhythm, normal heart sounds and intact distal pulses.   No murmur heard. Pulmonary/Chest: Effort normal and breath sounds normal. No respiratory distress. She has no wheezes. She has no rales.  Abdominal: Soft. Bowel sounds are normal. She exhibits no distension. There is no tenderness.  Musculoskeletal: She exhibits edema (Stable to unchanged non pitting edema bilateral ankles and feet L > R, some upper leg and L knee soft tissue edema).  Lymphadenopathy:    She has no cervical adenopathy.  Neurological: She is alert and oriented to person, place, and time.  Skin: Skin is warm and dry. She is not diaphoretic.  Psychiatric: She has a normal mood and affect. Her behavior is normal.  Well groomed, good eye contact, normal speech and thoughts  Nursing note and vitals reviewed.  Diabetic Foot Exam - Simple   Simple Foot Form Diabetic Foot exam was performed with the following findings:  Yes 04/20/2017 11:30 AM  Visual Inspection See comments:  Yes Sensation Testing See comments:  Yes  Pulse Check Posterior Tibialis and Dorsalis pulse intact bilaterally:  Yes Comments Bilateral feet and ankles with edema. Dry callus formation heels. L>R reduced monofilament sensation great toe and forefoot and some heel. Otherwise some preserved monofilament sensation. Light touch sensation was intact     Recent Labs  10/06/16 1055 01/17/17 1152 04/20/17 1106  HGBA1C 6.9% 6.9 7.1*    Results for orders placed or performed in visit on 04/20/17  POCT HgB A1C   Result Value Ref Range   Hemoglobin A1C 7.1 (A) 5.7      Assessment & Plan:   Problem List Items Addressed This Visit    Well controlled type 2 diabetes mellitus with nephropathy (HCC) - Primary    Well-controlled DM with A1c 7.1 (stable from 6.9) Complications - CKD-III, peripheral neuropathy  Plan:  1. Continue current therapy - Metformin 500mg  either daily vs BID - she is checking home bottle, unsure which one it is but my record shows daily 2. Encourage improved lifestyle - low carb, low sugar diet, reduce portion size, continue improving regular exercise 3. Continue ASA, ARB, Statin 5. DM Foot exam done today / Advised to schedule DM ophtho exam, send record 6. Follow-up 3 months A1c - then can space to q 6 months if stable and meds are organized      Relevant Orders   POCT HgB A1C (Completed)   Essential hypertension    Controlled HTN at goal - Home BP readings none  Complication with CKD-III   Plan:  1. Continue current BP regimen Losartan 100mg  daily, Metoprolol 100mg  BID - Should be OFF Amlodipine 10mg  daily, Carvedilol 25mg  BID - May continue Furosemide 20-40mg  BID for edema, should try taper 2. Encourage improved lifestyle - low sodium diet, regular exercise 3. If able, start monitor BP outside office, bring readings to next visit, if persistently >150/90 or new symptoms notify office sooner 4. Follow-up 3 months - [facility] at that time      Bilateral lower extremity edema    Stable chronic problem  Plan: 1. Should have DC'd Amlodipine 10mg  last time- does not have pill bottles, unsure if has done this 2. May try taper down Furosemide 40mg  BID vs half tab BID or PRN only, concern with CKD 3. Reviewed RICE therapy, likely some degree of chronic venous stasis present - emphasized degree of elevation, she was not elevating legs above heart level 4. Future follow-up consider ECHO if persistent swelling or other concerns        Other Visit Diagnoses    Postmenopausal estrogen deficiency       Due for DEXA, no prior testing, advised to schedule, test ordered   Relevant Orders   DG Bone Density      Meds ordered this encounter  Medications  . tiotropium (SPIRIVA) 18 MCG inhalation capsule    Sig: Place into inhaler and inhale.  [name]. DISCONTD: carvedilol (COREG) 25 MG tablet    Sig: take one tablet by mouth two times daily with a meal    Refill:  2    Follow up plan: Return in about 3 months (around 07/21/2017) for diabetes.  Sharon PilarAlexander Elmarie Devlin, DO [facility] 04/20/2017, 4:04 PM

## 2017-04-20 NOTE — Assessment & Plan Note (Signed)
Well-controlled DM with A1c 7.1 (stable from 6.9) Complications - CKD-III, peripheral neuropathy  Plan:  1. Continue current therapy - Metformin 500mg  either daily vs BID - she is checking home bottle, unsure which one it is but my record shows daily 2. Encourage improved lifestyle - low carb, low sugar diet, reduce portion size, continue improving regular exercise 3. Continue ASA, ARB, Statin 5. DM Foot exam done today / Advised to schedule DM ophtho exam, send record 6. Follow-up 3 months A1c - then can space to q 6 months if stable and meds are organized

## 2017-04-20 NOTE — Assessment & Plan Note (Signed)
Controlled HTN at goal - Home BP readings none  Complication with CKD-III   Plan:  1. Continue current BP regimen Losartan 100mg  daily, Metoprolol 100mg  BID - Should be OFF Amlodipine 10mg  daily, Carvedilol 25mg  BID - May continue Furosemide 20-40mg  BID for edema, should try taper 2. Encourage improved lifestyle - low sodium diet, regular exercise 3. If able, start monitor BP outside office, bring readings to next visit, if persistently >150/90 or new symptoms notify office sooner 4. Follow-up 3 months - [facility] at that time

## 2017-06-06 ENCOUNTER — Telehealth: Payer: Self-pay | Admitting: Family Medicine

## 2017-06-06 NOTE — Telephone Encounter (Signed)
Advised pt that she can call home health and they can fax us the form.

## 2017-06-06 NOTE — Telephone Encounter (Signed)
Pt. Daughter called wanted to know if we could write a prescription for a nebulizer (battery) pt call back # is [phone]

## 2017-07-05 DIAGNOSIS — J449 Chronic obstructive pulmonary disease, unspecified: Secondary | ICD-10-CM | POA: Diagnosis not present

## 2017-07-05 DIAGNOSIS — J301 Allergic rhinitis due to pollen: Secondary | ICD-10-CM | POA: Diagnosis not present

## 2017-07-11 DIAGNOSIS — J449 Chronic obstructive pulmonary disease, unspecified: Secondary | ICD-10-CM | POA: Diagnosis not present

## 2017-07-11 DIAGNOSIS — J4540 Moderate persistent asthma, uncomplicated: Secondary | ICD-10-CM | POA: Diagnosis not present

## 2017-07-17 ENCOUNTER — Encounter: Payer: Medicare HMO | Attending: Pulmonary Disease

## 2017-07-17 VITALS — Ht 64.7 in | Wt 211.5 lb

## 2017-07-17 DIAGNOSIS — J449 Chronic obstructive pulmonary disease, unspecified: Secondary | ICD-10-CM | POA: Diagnosis not present

## 2017-07-17 NOTE — Progress Notes (Signed)
Pulmonary Individual Treatment Plan  Patient Details  Name: Sharon Henderson MRN: [id number] Date of Birth: [birth year] Referring Provider:     Pulmonary Rehab from 07/17/2017 in [facility] Cardiac and Pulmonary Rehab  Referring Provider  Jaclynn Major MD      Initial Encounter Date:    Pulmonary Rehab from 07/17/2017 in [facility] Cardiac and Pulmonary Rehab  Date  07/17/17  Referring Provider  Jaclynn Major MD      Visit Diagnosis: Asthma with chronic obstructive pulmonary disease (COPD) (HCC)  Patient's Home Medications on Admission:  Current Outpatient Prescriptions:  .  acetaminophen (TYLENOL) 500 MG tablet, Take 1,000 mg by mouth every 8 (eight) hours as needed., Disp: , Rfl:  .  albuterol (PROAIR HFA) 108 (90 Base) MCG/ACT inhaler, Inhale 2 puffs into the lungs every 6 (six) hours as needed for wheezing or shortness of breath. Due to insurance please dispense as Ventolin HFA same directions., Disp: 8.5 Inhaler, Rfl: 6 .  aspirin EC 81 MG tablet, Take by mouth., Disp: , Rfl:  .  budesonide-formoterol (SYMBICORT) 160-4.5 MCG/ACT inhaler, INHALE 2 INHALATIONS INTO THE LUNGS 2 (TWO) TIMES DAILY., Disp: 10.2 Inhaler, Rfl: 6 .  cetirizine (ZYRTEC) 10 MG tablet, TAKE 1 TABLET (10 MG TOTAL) BY MOUTH DAILY., Disp: 90 tablet, Rfl: 2 .  fluticasone (FLONASE) 50 MCG/ACT nasal spray, PLACE 1-2 SPRAYS INTO BOTH NOSTRILS DAILY., Disp: 16 g, Rfl: 3 .  furosemide (LASIX) 40 MG tablet, Take 1 tablet twice a day except with extra swelling, then take 2 tabs in AM and 1 in early afternoon.  Take extra tabs no more than 5 days., Disp: 70 tablet, Rfl: 5 .  ibuprofen (ADVIL,MOTRIN) 200 MG tablet, Take by mouth., Disp: , Rfl:  .  losartan (COZAAR) 100 MG tablet, Take 1 tablet (100 mg total) by mouth daily., Disp: 90 tablet, Rfl: 3 .  metFORMIN (GLUCOPHAGE) 500 MG tablet, Take 1 tablet (500 mg total) by mouth daily with breakfast., Disp: 90 tablet, Rfl: 3 .  metoprolol (LOPRESSOR) 100 MG tablet, TAKE 1 TABLET TWICE  A DAY, Disp: 60 tablet, Rfl: 7 .  montelukast (SINGULAIR) 10 MG tablet, TAKE 1 TABLET (10 MG TOTAL) BY MOUTH NIGHTLY. FOR ALLERGIES, Disp: 30 tablet, Rfl: 12 .  simvastatin (ZOCOR) 10 MG tablet, Take 1 tablet (10 mg total) by mouth at bedtime., Disp: 30 tablet, Rfl: 11 .  tiotropium (SPIRIVA) 18 MCG inhalation capsule, Place into inhaler and inhale., Disp: , Rfl:   Past Medical History: Past Medical History:  Diagnosis Date  . Acute respiratory failure (HCC)    secondary to asthma  . Allergic rhinitis   . Asthma   . Diabetes (HCC)    type 2  . Diverticulosis   . Hyperlipidemia   . Hypertension   . Internal hemorrhoids     Tobacco Use: History  Smoking Status  . Never Smoker  Smokeless Tobacco  . Never Used    Labs: Recent Review Flowsheet Data    Labs for ITP Cardiac and Pulmonary Rehab Latest Ref Rng & Units 04/18/2016 10/06/2016 10/11/2016 01/17/2017 04/20/2017   Cholestrol <200 mg/dL - - 782(N) - -   LDLCALC <100 mg/dL - - 562(Z) - -   HDL >30 mg/dL - - 61 - -   Trlycerides <150 mg/dL - - 87 - -   Hemoglobin A1c 5.7 6.8% 6.9% - 6.9 7.1(A)       Pulmonary Assessment Scores:     Pulmonary Assessment Scores    Row Name 07/17/17 1415  ADL UCSD   ADL Phase Entry     SOB Score total 89     Rest 3     Walk 5     Stairs 5     Bath 3     Dress 3     Shop 3       CAT Score   CAT Score 31       mMRC Score   mMRC Score 3        Pulmonary Function Assessment:     Pulmonary Function Assessment - 07/17/17 1445      Initial Spirometry Results   FVC% 50 %   FEV1% 52 %   FEV1/FVC Ratio 81.24   Comments best of 2 results, good patient effort     Post Bronchodilator Spirometry Results   FVC% 43 %   FEV1% 42.64 %   FEV1/FVC Ratio 78.02   Comments best of 2 results, good patient effort     Breath   Bilateral Breath Sounds Clear  slight wheezes in bases   Shortness of Breath Limiting activity;Fear of Shortness of Breath;No;Panic with Shortness of  Breath      Exercise Target Goals: Date: 07/17/17  Exercise Program Goal: Individual exercise prescription set with THRR, safety & activity barriers. Participant demonstrates ability to understand and report RPE using BORG scale, to self-measure pulse accurately, and to acknowledge the importance of the exercise prescription.  Exercise Prescription Goal: Starting with aerobic activity 30 plus minutes a day, 3 days per week for initial exercise prescription. Provide home exercise prescription and guidelines that participant acknowledges understanding prior to discharge.  Activity Barriers & Risk Stratification:     Activity Barriers & Cardiac Risk Stratification - 07/17/17 1536      Activity Barriers & Cardiac Risk Stratification   Activity Barriers Shortness of Breath;Muscular Weakness;Balance Concerns;Other (comment)   Comments Left Knee stiffness - residual to a past accident       6 Minute Walk:     6 Minute Walk    Row Name 07/17/17 1532         6 Minute Walk   Phase Initial     Distance 433 feet     Walk Time 4.91 minutes     # of Rest Breaks 1  test was stopped at 4:55      MPH 1     METS 0.59     RPE 11     Perceived Dyspnea  2     VO2 Peak 2.08     Symptoms Yes (comment)     Comments Shortness of breath      Resting HR 57 bpm     Resting BP 128/72     Resting Oxygen Saturation  98 %     Exercise Oxygen Saturation  during 6 min walk 95 %     Max Ex. HR 113 bpm     Max Ex. BP 124/70     2 Minute Post BP 116/64       Interval HR   1 Minute HR 94     2 Minute HR 89     3 Minute HR 103     4 Minute HR 105     5 Minute HR 113  test stopped at 4:55     6 Minute HR 82     2 Minute Post HR 66     Interval Heart Rate? Yes       Interval Oxygen   Interval Oxygen?  Yes     Baseline Oxygen Saturation % 98 %     1 Minute Oxygen Saturation % 100 %     1 Minute Liters of Oxygen 0 L  Room Air     2 Minute Oxygen Saturation % 98 %     2 Minute Liters of  Oxygen 0 L     3 Minute Oxygen Saturation % 95 %     3 Minute Liters of Oxygen 0 L     4 Minute Oxygen Saturation % 95 %     4 Minute Liters of Oxygen 0 L     5 Minute Oxygen Saturation % 95 %     5 Minute Liters of Oxygen 0 L     6 Minute Oxygen Saturation % 96 %     6 Minute Liters of Oxygen 0 L     2 Minute Post Oxygen Saturation % 100 %     2 Minute Post Liters of Oxygen 0 L       Oxygen Initial Assessment:     Oxygen Initial Assessment - 07/17/17 1400      Home Oxygen   Home Oxygen Device None   Sleep Oxygen Prescription None   Home Exercise Oxygen Prescription None   Home at Rest Exercise Oxygen Prescription None   Compliance with Home Oxygen Use --  does not have home oxygen     Initial 6 min Walk   Oxygen Used None     Program Oxygen Prescription   Program Oxygen Prescription None     Intervention   Short Term Goals To learn and understand importance of maintaining oxygen saturations>88%;To learn and demonstrate proper use of respiratory medications;To learn and demonstrate proper pursed lip breathing techniques or other breathing techniques.;To learn and understand importance of monitoring SPO2 with pulse oximeter and demonstrate accurate use of the pulse oximeter.   Long  Term Goals Verbalizes importance of monitoring SPO2 with pulse oximeter and return demonstration;Exhibits proper breathing techniques, such as pursed lip breathing or other method taught during program session;Demonstrates proper use of MDI's;Maintenance of O2 saturations>88%;Compliance with respiratory medication      Oxygen Re-Evaluation:   Oxygen Discharge (Final Oxygen Re-Evaluation):   Initial Exercise Prescription:     Initial Exercise Prescription - 07/17/17 1500      Date of Initial Exercise RX and Referring Provider   Date 07/17/17   Referring Provider Jaclynn MajorMatthews, Anne MD     Treadmill   MPH 1   Grade 0   Minutes 15  5 intervals of 3 minutes    METs 1.77     NuStep    Level 1   SPM 80   Minutes 15   METs 3     Arm Ergometer   Level 1   Watts 5   RPM 25   Minutes 15   METs 3     Prescription Details   Frequency (times per week) 2   Duration Progress to 45 minutes of aerobic exercise without signs/symptoms of physical distress     Intensity   THRR 40-80% of Max Heartrate 91-125   Ratings of Perceived Exertion 11-13   Perceived Dyspnea 0-4     Progression   Progression Continue to progress workloads to maintain intensity without signs/symptoms of physical distress.     Resistance Training   Training Prescription Yes   Weight 2   Reps 10-15      Perform Capillary Blood Glucose checks as needed.  Exercise Prescription  Changes:     Exercise Prescription Changes    Row Name 07/17/17 1500             Response to Exercise   Blood Pressure (Admit) 128/72       Blood Pressure (Exercise) 124/70       Blood Pressure (Exit) 116/64       Heart Rate (Admit) 57 bpm       Heart Rate (Exercise) 113 bpm       Heart Rate (Exit) 66 bpm       Oxygen Saturation (Admit) 98 %       Oxygen Saturation (Exercise) 95 %       Oxygen Saturation (Exit) 100 %       Rating of Perceived Exertion (Exercise) 11       Perceived Dyspnea (Exercise) 2       Symptoms SOB        Comments walk test completed       Duration -          Exercise Comments:   Exercise Goals and Review:     Exercise Goals    Row Name 07/17/17 1544             Exercise Goals   Increase Physical Activity Yes       Intervention Provide advice, education, support and counseling about physical activity/exercise needs.;Develop an individualized exercise prescription for aerobic and resistive training based on initial evaluation findings, risk stratification, comorbidities and participant's personal goals.       Expected Outcomes Achievement of increased cardiorespiratory fitness and enhanced flexibility, muscular endurance and strength shown through measurements of functional  capacity and personal statement of participant.       Increase Strength and Stamina Yes       Intervention Provide advice, education, support and counseling about physical activity/exercise needs.;Develop an individualized exercise prescription for aerobic and resistive training based on initial evaluation findings, risk stratification, comorbidities and participant's personal goals.       Expected Outcomes Achievement of increased cardiorespiratory fitness and enhanced flexibility, muscular endurance and strength shown through measurements of functional capacity and personal statement of participant.       Able to understand and use rate of perceived exertion (RPE) scale Yes       Intervention Provide education and explanation on how to use RPE scale       Expected Outcomes Short Term: Able to use RPE daily in rehab to express subjective intensity level;Long Term:  Able to use RPE to guide intensity level when exercising independently       Able to understand and use Dyspnea scale Yes       Intervention Provide education and explanation on how to use Dyspnea scale       Expected Outcomes Short Term: Able to use Dyspnea scale daily in rehab to express subjective sense of shortness of breath during exertion;Long Term: Able to use Dyspnea scale to guide intensity level when exercising independently       Knowledge and understanding of Target Heart Rate Range (THRR) Yes       Intervention Provide education and explanation of THRR including how the numbers were predicted and where they are located for reference       Expected Outcomes Short Term: Able to state/look up THRR;Long Term: Able to use THRR to govern intensity when exercising independently;Short Term: Able to use daily as guideline for intensity in rehab       Able to  check pulse independently Yes       Intervention Provide education and demonstration on how to check pulse in carotid and radial arteries.;Review the importance of being able to  check your own pulse for safety during independent exercise       Expected Outcomes Short Term: Able to explain why pulse checking is important during independent exercise;Long Term: Able to check pulse independently and accurately       Understanding of Exercise Prescription Yes       Intervention Provide education, explanation, and written materials on patient's individual exercise prescription       Expected Outcomes Short Term: Able to explain program exercise prescription;Long Term: Able to explain home exercise prescription to exercise independently          Exercise Goals Re-Evaluation :   Discharge Exercise Prescription (Final Exercise Prescription Changes):     Exercise Prescription Changes - 07/17/17 1500      Response to Exercise   Blood Pressure (Admit) 128/72   Blood Pressure (Exercise) 124/70   Blood Pressure (Exit) 116/64   Heart Rate (Admit) 57 bpm   Heart Rate (Exercise) 113 bpm   Heart Rate (Exit) 66 bpm   Oxygen Saturation (Admit) 98 %   Oxygen Saturation (Exercise) 95 %   Oxygen Saturation (Exit) 100 %   Rating of Perceived Exertion (Exercise) 11   Perceived Dyspnea (Exercise) 2   Symptoms SOB    Comments walk test completed   Duration --      Nutrition:  Target Goals: Understanding of nutrition guidelines, daily intake of sodium 1500mg , cholesterol 200mg , calories 30% from fat and 7% or less from saturated fats, daily to have 5 or more servings of fruits and vegetables.  Biometrics:     Pre Biometrics - 07/17/17 1545      Pre Biometrics   Height 5' 4.7" (1.643 m)   Weight 211 lb 8 oz (95.9 kg)   Waist Circumference 43 inches   Hip Circumference 49.7 inches   Waist to Hip Ratio 0.87 %   BMI (Calculated) 35.54       Nutrition Therapy Plan and Nutrition Goals:     Nutrition Therapy & Goals - 07/17/17 1413      Intervention Plan   Intervention Prescribe, educate and counsel regarding individualized specific dietary modifications aiming  towards targeted core components such as weight, hypertension, lipid management, diabetes, heart failure and other comorbidities.;Nutrition handout(s) given to patient.   Expected Outcomes Short Term Goal: Understand basic principles of dietary content, such as calories, fat, sodium, cholesterol and nutrients.;Short Term Goal: A plan has been developed with personal nutrition goals set during dietitian appointment.;Long Term Goal: Adherence to prescribed nutrition plan.      Nutrition Discharge: Rate Your Plate Scores:     Nutrition Assessments - 07/17/17 1414      MEDFICTS Scores   Pre Score 33      Nutrition Goals Re-Evaluation:   Nutrition Goals Discharge (Final Nutrition Goals Re-Evaluation):   Psychosocial: Target Goals: Acknowledge presence or absence of significant depression and/or stress, maximize coping skills, provide positive support system. Participant is able to verbalize types and ability to use techniques and skills needed for reducing stress and depression.   Initial Review & Psychosocial Screening:     Initial Psych Review & Screening - 07/17/17 1410      Initial Review   Current issues with Current Stress Concerns   Source of Stress Concerns Chronic Illness   Comments Her ADL's are limited  with her disease     Family Dynamics   Good Support System? Yes   Comments Her four daughters are a great support system and three sisters.     Barriers   Psychosocial barriers to participate in program There are no identifiable barriers or psychosocial needs.     Screening Interventions   Interventions Yes;Encouraged to exercise;Provide feedback about the scores to participant;Program counselor consult   Expected Outcomes Short Term goal: Utilizing psychosocial counselor, staff and physician to assist with identification of specific Stressors or current issues interfering with healing process. Setting desired goal for each stressor or current issue identified.;Long  Term Goal: Stressors or current issues are controlled or eliminated.;Short Term goal: Identification and review with participant of any Quality of Life or Depression concerns found by scoring the questionnaire.;Long Term goal: The participant improves quality of Life and PHQ9 Scores as seen by post scores and/or verbalization of changes      Quality of Life Scores:   PHQ-9: Recent Review Flowsheet Data    Depression screen [facility] 2/9 07/17/2017 04/04/2017 04/18/2016 01/14/2016 08/14/2015   Decreased Interest 0 2 0 0 -   Down, Depressed, Hopeless 0 3 0 0 3   PHQ - 2 Score 0 5 0 0 3   Altered sleeping 0 0 - - 2   Tired, decreased energy 1 0 - - 1   Change in appetite 0 0 - - 1   Feeling bad or failure about yourself  1 1 - - 0   Trouble concentrating 1 1 - - 1   Moving slowly or fidgety/restless 0 0 - - 0   Suicidal thoughts 0 0 - - 0   PHQ-9 Score 3 7 - - 8   Difficult doing work/chores Not difficult at all Not difficult at all - - -     Interpretation of Total Score  Total Score Depression Severity:  1-4 = Minimal depression, 5-9 = Mild depression, 10-14 = Moderate depression, 15-19 = Moderately severe depression, 20-27 = Severe depression   Psychosocial Evaluation and Intervention:   Psychosocial Re-Evaluation:   Psychosocial Discharge (Final Psychosocial Re-Evaluation):   Education: Education Goals: Education classes will be provided on a weekly basis, covering required topics. Participant will state understanding/return demonstration of topics presented.  Learning Barriers/Preferences:     Learning Barriers/Preferences - 07/17/17 1449      Learning Barriers/Preferences   Learning Barriers Sight  wears glasses   Learning Preferences None      Education Topics: Initial Evaluation Education: - Verbal, written and demonstration of respiratory meds, RPE/PD scales, oximetry and breathing techniques. Instruction on use of nebulizers and MDIs: cleaning and proper use,  rinsing mouth with steroid doses and importance of monitoring MDI activations.   Pulmonary Rehab from 07/17/2017 in [facility] Cardiac and Pulmonary Rehab  Date  07/17/17  Educator  [facility]  Instruction Review Code  1- Verbalizes Understanding      General Nutrition Guidelines/Fats and Fiber: -Group instruction provided by verbal, written material, models and posters to present the general guidelines for heart healthy nutrition. Gives an explanation and review of dietary fats and fiber.   Controlling Sodium/Reading Food Labels: -Group verbal and written material supporting the discussion of sodium use in heart healthy nutrition. Review and explanation with models, verbal and written materials for utilization of the food label.   Exercise Physiology & Risk Factors: - Group verbal and written instruction with models to review the exercise physiology of the cardiovascular system and associated critical values.  Details cardiovascular disease risk factors and the goals associated with each risk factor.   Aerobic Exercise & Resistance Training: - Gives group verbal and written discussion on the health impact of inactivity. On the components of aerobic and resistive training programs and the benefits of this training and how to safely progress through these programs.   Flexibility, Balance, General Exercise Guidelines: - Provides group verbal and written instruction on the benefits of flexibility and balance training programs. Provides general exercise guidelines with specific guidelines to those with heart or lung disease. Demonstration and skill practice provided.   Stress Management: - Provides group verbal and written instruction about the health risks of elevated stress, cause of high stress, and healthy ways to reduce stress.   Depression: - Provides group verbal and written instruction on the correlation between heart/lung disease and depressed mood, treatment options, and the stigmas  associated with seeking treatment.   Exercise & Equipment Safety: - Individual verbal instruction and demonstration of equipment use and safety with use of the equipment.   Pulmonary Rehab from 07/17/2017 in [facility] Cardiac and Pulmonary Rehab  Date  07/17/17  Educator  [facility]  Instruction Review Code  1- Verbalizes Understanding      Infection Prevention: - Provides verbal and written material to individual with discussion of infection control including proper hand washing and proper equipment cleaning during exercise session.   Pulmonary Rehab from 07/17/2017 in [facility] Cardiac and Pulmonary Rehab  Date  07/17/17  Educator  [facility]  Instruction Review Code  1- Verbalizes Understanding      Falls Prevention: - Provides verbal and written material to individual with discussion of falls prevention and safety.   Pulmonary Rehab from 07/17/2017 in [facility] Cardiac and Pulmonary Rehab  Date  07/17/17  Educator  [facility]  Instruction Review Code  1- Verbalizes Understanding      Diabetes: - Individual verbal and written instruction to review signs/symptoms of diabetes, desired ranges of glucose level fasting, after meals and with exercise. Advice that pre and post exercise glucose checks will be done for 3 sessions at entry of program.   Chronic Lung Diseases: - Group verbal and written instruction to review new updates, new respiratory medications, new advancements in procedures and treatments. Provide informative websites and "800" numbers of self-education.   Lung Procedures: - Group verbal and written instruction to describe testing methods done to diagnose lung disease. Review the outcome of test results. Describe the treatment choices: Pulmonary Function Tests, ABGs and oximetry.   Energy Conservation: - Provide group verbal and written instruction for methods to conserve energy, plan and organize activities. Instruct on pacing techniques, use of adaptive equipment and posture/positioning to  relieve shortness of breath.   Triggers: - Group verbal and written instruction to review types of environmental controls: home humidity, furnaces, filters, dust mite/pet prevention, HEPA vacuums. To discuss weather changes, air quality and the benefits of nasal washing.   Exacerbations: - Group verbal and written instruction to provide: warning signs, infection symptoms, calling MD promptly, preventive modes, and value of vaccinations. Review: effective airway clearance, coughing and/or vibration techniques. Create an Sport and exercise psychologist.   Oxygen: - Individual and group verbal and written instruction on oxygen therapy. Includes supplement oxygen, available portable oxygen systems, continuous and intermittent flow rates, oxygen safety, concentrators, and Medicare reimbursement for oxygen.   Pulmonary Rehab from 07/17/2017 in [facility] Cardiac and Pulmonary Rehab  Date  07/17/17  Educator  [facility]  Instruction Review Code  1- Verbalizes Understanding  Respiratory Medications: - Group verbal and written instruction to review medications for lung disease. Drug class, frequency, complications, importance of spacers, rinsing mouth after steroid MDI's, and proper cleaning methods for nebulizers.   Pulmonary Rehab from 07/17/2017 in [facility] Cardiac and Pulmonary Rehab  Date  07/17/17  Educator  [facility]  Instruction Review Code  1- Verbalizes Understanding      AED/CPR: - Group verbal and written instruction with the use of models to demonstrate the basic use of the AED with the basic ABC's of resuscitation.   Breathing Retraining: - Provides individuals verbal and written instruction on purpose, frequency, and proper technique of diaphragmatic breathing and pursed-lipped breathing. Applies individual practice skills.   Pulmonary Rehab from 07/17/2017 in [facility] Cardiac and Pulmonary Rehab  Date  07/17/17  Educator  [facility]  Instruction Review Code  1- Verbalizes Understanding      Anatomy and Physiology of the  Lungs: - Group verbal and written instruction with the use of models to provide basic lung anatomy and physiology related to function, structure and complications of lung disease.   Anatomy & Physiology of the Heart: - Group verbal and written instruction and models provide basic cardiac anatomy and physiology, with the coronary electrical and arterial systems. Review of: AMI, Angina, Valve disease, Heart Failure, Cardiac Arrhythmia, Pacemakers, and the ICD.   Heart Failure: - Group verbal and written instruction on the basics of heart failure: signs/symptoms, treatments, explanation of ejection fraction, enlarged heart and cardiomyopathy.   Sleep Apnea: - Individual verbal and written instruction to review Obstructive Sleep Apnea. Review of risk factors, methods for diagnosing and types of masks and machines for OSA.   Anxiety: - Provides group, verbal and written instruction on the correlation between heart/lung disease and anxiety, treatment options, and management of anxiety.   Relaxation: - Provides group, verbal and written instruction about the benefits of relaxation for patients with heart/lung disease. Also provides patients with examples of relaxation techniques.   Cardiac Medications: - Group verbal and written instruction to review commonly prescribed medications for heart disease. Reviews the medication, class of the drug, and side effects.   Know Your Numbers: -Group verbal and written instruction about important numbers in your health.  Review of Cholesterol, Blood Pressure, Diabetes, and BMI and the role they play in your overall health.   Other: -Provides group and verbal instruction on various topics (see comments)    Knowledge Questionnaire Score:     Knowledge Questionnaire Score - 07/17/17 1416      Knowledge Questionnaire Score   Pre Score 7/10       Core Components/Risk Factors/Patient Goals at Admission:     Personal Goals and Risk Factors at  Admission - 07/17/17 1431      Core Components/Risk Factors/Patient Goals on Admission    Weight Management Yes;Weight Loss   Intervention Weight Management: Develop a combined nutrition and exercise program designed to reach desired caloric intake, while maintaining appropriate intake of nutrient and fiber, sodium and fats, and appropriate energy expenditure required for the weight goal.;Weight Management: Provide education and appropriate resources to help participant work on and attain dietary goals.;Weight Management/Obesity: Establish reasonable short term and long term weight goals.   Admit Weight 211 lb 12.8 oz (96.1 kg)   Goal Weight: Short Term 205 lb (93 kg)   Goal Weight: Long Term 175 lb (79.4 kg)   Expected Outcomes Short Term: Continue to assess and modify interventions until short term weight is achieved;Long Term: Adherence to nutrition  and physical activity/exercise program aimed toward attainment of established weight goal;Weight Loss: Understanding of general recommendations for a balanced deficit meal plan, which promotes 1-2 lb weight loss per week and includes a negative energy balance of [phone] kcal/d;Understanding recommendations for meals to include 15-35% energy as protein, 25-35% energy from fat, 35-60% energy from carbohydrates, less than 200mg  of dietary cholesterol, 20-35 gm of total fiber daily;Understanding of distribution of calorie intake throughout the day with the consumption of 4-5 meals/snacks   Improve shortness of breath with ADL's Yes   Intervention Provide education, individualized exercise plan and daily activity instruction to help decrease symptoms of SOB with activities of daily living.   Expected Outcomes Short Term: Achieves a reduction of symptoms when performing activities of daily living.   Hypertension Yes   Intervention Provide education on lifestyle modifcations including regular physical activity/exercise, weight management, moderate sodium  restriction and increased consumption of fresh fruit, vegetables, and low fat dairy, alcohol moderation, and smoking cessation.;Monitor prescription use compliance.   Expected Outcomes Short Term: Continued assessment and intervention until BP is < 140/23mm HG in hypertensive participants. < 130/81mm HG in hypertensive participants with diabetes, heart failure or chronic kidney disease.;Long Term: Maintenance of blood pressure at goal levels.   Stress Yes   Intervention Offer individual and/or small group education and counseling on adjustment to heart disease, stress management and health-related lifestyle change. Teach and support self-help strategies.;Refer participants experiencing significant psychosocial distress to appropriate mental health specialists for further evaluation and treatment. When possible, include family members and significant others in education/counseling sessions.   Expected Outcomes Short Term: Participant demonstrates changes in health-related behavior, relaxation and other stress management skills, ability to obtain effective social support, and compliance with psychotropic medications if prescribed.;Long Term: Emotional wellbeing is indicated by absence of clinically significant psychosocial distress or social isolation.      Core Components/Risk Factors/Patient Goals Review:    Core Components/Risk Factors/Patient Goals at Discharge (Final Review):    ITP Comments:     ITP Comments    Row Name 07/17/17 1358           ITP Comments Medical Evaluation completed. Chart sent for review and changes to Dr. Bethann Punches Director of [facility]. Diagnosis can be found in [facility] encounter 07/17/17          Comments: Initial ITP

## 2017-07-17 NOTE — Patient Instructions (Signed)
Patient Instructions  Patient Details  Name: Sharon Henderson MRN: [id number] Date of Birth: [birth year] Referring Provider:  Ronnald Nian, MD  Below are the personal goals you chose as well as exercise and nutrition goals. Our goal is to help you keep on track towards obtaining and maintaining your goals. We will be discussing your progress on these goals with you throughout the program.  Initial Exercise Prescription:     Initial Exercise Prescription - 07/17/17 1500      Date of Initial Exercise RX and Referring Provider   Date 07/17/17   Referring Provider Jaclynn Major MD     Treadmill   MPH 1   Grade 0   Minutes 15  5 intervals of 3 minutes    METs 1.77     NuStep   Level 1   SPM 80   Minutes 15   METs 3     Arm Ergometer   Level 1   Watts 5   RPM 25   Minutes 15   METs 3     Prescription Details   Frequency (times per week) 2   Duration Progress to 45 minutes of aerobic exercise without signs/symptoms of physical distress     Intensity   THRR 40-80% of Max Heartrate 91-125   Ratings of Perceived Exertion 11-13   Perceived Dyspnea 0-4     Progression   Progression Continue to progress workloads to maintain intensity without signs/symptoms of physical distress.     Resistance Training   Training Prescription Yes   Weight 2   Reps 10-15      Exercise Goals: Frequency: Be able to perform aerobic exercise three times per week working toward 3-5 days per week.  Intensity: Work with a perceived exertion of 11 (fairly light) - 15 (hard) as tolerated. Follow your new exercise prescription and watch for changes in prescription as you progress with the program. Changes will be reviewed with you when they are made.  Duration: You should be able to do 30 minutes of continuous aerobic exercise in addition to a 5 minute warm-up and a 5 minute cool-down routine.  Nutrition Goals: Your personal nutrition goals will be established when you do your nutrition  analysis with the dietician.  The following are nutrition guidelines to follow: Cholesterol < 200mg /day Sodium < 1500mg /day Fiber: Women over 50 yrs - 21 grams per day  Personal Goals:     Personal Goals and Risk Factors at Admission - 07/17/17 1431      Core Components/Risk Factors/Patient Goals on Admission    Weight Management Yes;Weight Loss   Intervention Weight Management: Develop a combined nutrition and exercise program designed to reach desired caloric intake, while maintaining appropriate intake of nutrient and fiber, sodium and fats, and appropriate energy expenditure required for the weight goal.;Weight Management: Provide education and appropriate resources to help participant work on and attain dietary goals.;Weight Management/Obesity: Establish reasonable short term and long term weight goals.   Admit Weight 211 lb 12.8 oz (96.1 kg)   Goal Weight: Short Term 205 lb (93 kg)   Goal Weight: Long Term 175 lb (79.4 kg)   Expected Outcomes Short Term: Continue to assess and modify interventions until short term weight is achieved;Long Term: Adherence to nutrition and physical activity/exercise program aimed toward attainment of established weight goal;Weight Loss: Understanding of general recommendations for a balanced deficit meal plan, which promotes 1-2 lb weight loss per week and includes a negative energy balance of [phone] kcal/d;Understanding recommendations  for meals to include 15-35% energy as protein, 25-35% energy from fat, 35-60% energy from carbohydrates, less than 200mg  of dietary cholesterol, 20-35 gm of total fiber daily;Understanding of distribution of calorie intake throughout the day with the consumption of 4-5 meals/snacks   Improve shortness of breath with ADL's Yes   Intervention Provide education, individualized exercise plan and daily activity instruction to help decrease symptoms of SOB with activities of daily living.   Expected Outcomes Short Term: Achieves a  reduction of symptoms when performing activities of daily living.   Hypertension Yes   Intervention Provide education on lifestyle modifcations including regular physical activity/exercise, weight management, moderate sodium restriction and increased consumption of fresh fruit, vegetables, and low fat dairy, alcohol moderation, and smoking cessation.;Monitor prescription use compliance.   Expected Outcomes Short Term: Continued assessment and intervention until BP is < 140/34mm HG in hypertensive participants. < 130/38mm HG in hypertensive participants with diabetes, heart failure or chronic kidney disease.;Long Term: Maintenance of blood pressure at goal levels.   Stress Yes   Intervention Offer individual and/or small group education and counseling on adjustment to heart disease, stress management and health-related lifestyle change. Teach and support self-help strategies.;Refer participants experiencing significant psychosocial distress to appropriate mental health specialists for further evaluation and treatment. When possible, include family members and significant others in education/counseling sessions.   Expected Outcomes Short Term: Participant demonstrates changes in health-related behavior, relaxation and other stress management skills, ability to obtain effective social support, and compliance with psychotropic medications if prescribed.;Long Term: Emotional wellbeing is indicated by absence of clinically significant psychosocial distress or social isolation.      Tobacco Use Initial Evaluation: History  Smoking Status  . Never Smoker  Smokeless Tobacco  . Never Used    Exercise Goals and Review:     Exercise Goals    Row Name 07/17/17 1544             Exercise Goals   Increase Physical Activity Yes       Intervention Provide advice, education, support and counseling about physical activity/exercise needs.;Develop an individualized exercise prescription for aerobic and  resistive training based on initial evaluation findings, risk stratification, comorbidities and participant's personal goals.       Expected Outcomes Achievement of increased cardiorespiratory fitness and enhanced flexibility, muscular endurance and strength shown through measurements of functional capacity and personal statement of participant.       Increase Strength and Stamina Yes       Intervention Provide advice, education, support and counseling about physical activity/exercise needs.;Develop an individualized exercise prescription for aerobic and resistive training based on initial evaluation findings, risk stratification, comorbidities and participant's personal goals.       Expected Outcomes Achievement of increased cardiorespiratory fitness and enhanced flexibility, muscular endurance and strength shown through measurements of functional capacity and personal statement of participant.       Able to understand and use rate of perceived exertion (RPE) scale Yes       Intervention Provide education and explanation on how to use RPE scale       Expected Outcomes Short Term: Able to use RPE daily in rehab to express subjective intensity level;Long Term:  Able to use RPE to guide intensity level when exercising independently       Able to understand and use Dyspnea scale Yes       Intervention Provide education and explanation on how to use Dyspnea scale       Expected Outcomes Short  Term: Able to use Dyspnea scale daily in rehab to express subjective sense of shortness of breath during exertion;Long Term: Able to use Dyspnea scale to guide intensity level when exercising independently       Knowledge and understanding of Target Heart Rate Range (THRR) Yes       Intervention Provide education and explanation of THRR including how the numbers were predicted and where they are located for reference       Expected Outcomes Short Term: Able to state/look up THRR;Long Term: Able to use THRR to govern  intensity when exercising independently;Short Term: Able to use daily as guideline for intensity in rehab       Able to check pulse independently Yes       Intervention Provide education and demonstration on how to check pulse in carotid and radial arteries.;Review the importance of being able to check your own pulse for safety during independent exercise       Expected Outcomes Short Term: Able to explain why pulse checking is important during independent exercise;Long Term: Able to check pulse independently and accurately       Understanding of Exercise Prescription Yes       Intervention Provide education, explanation, and written materials on patient's individual exercise prescription       Expected Outcomes Short Term: Able to explain program exercise prescription;Long Term: Able to explain home exercise prescription to exercise independently          Copy of goals given to participant.

## 2017-07-24 DIAGNOSIS — J449 Chronic obstructive pulmonary disease, unspecified: Secondary | ICD-10-CM | POA: Diagnosis not present

## 2017-07-24 NOTE — Progress Notes (Signed)
Daily Session Note  Patient Details  Name: Sharon Henderson MRN: [id number] Date of Birth: [birth year] Referring Provider:     Pulmonary Rehab from 07/17/2017 in [facility]  Referring Provider  Ernst Breach MD      Encounter Date: 07/24/2017  Check In:     Session Check In - 07/24/17 1149      Check-In   Location [facility]   Staff Present Nada Maclachlan, BA, ACSM CEP, Exercise Physiologist;Kelly Amedeo Plenty, BS, ACSM CEP, Exercise Physiologist;Dionne Rossa Flavia Shipper   Supervising physician immediately available to respond to emergencies [facility] immediately available ER MD   Physician(s) Dr. Cinda Quest and Reita Cliche   Medication changes reported     No   Fall or balance concerns reported    No   Warm-up and Cool-down Performed as group-led instruction   Resistance Training Performed Yes   VAD Patient? No     Pain Assessment   Currently in Pain? No/denies         History  Smoking Status  . Never Smoker  Smokeless Tobacco  . Never Used    Goals Met:  Exercise tolerated well Queuing for purse lip breathing No report of cardiac concerns or symptoms Strength training completed today  Goals Unmet:  Not Applicable  Comments: First full day of exercise!  Patient was oriented to gym and equipment including functions, settings, policies, and procedures.  Patient's individual exercise prescription and treatment plan were reviewed.  All starting workloads were established based on the results of the 6 minute walk test done at initial orientation visit.  The plan for exercise progression was also introduced and progression will be customized based on patient's performance and goals.   Dr. Emily Filbert is Medical Director for [facility] [place] and [facility].

## 2017-07-27 ENCOUNTER — Ambulatory Visit (INDEPENDENT_AMBULATORY_CARE_PROVIDER_SITE_OTHER): Payer: Medicare HMO | Admitting: Family Medicine

## 2017-07-27 ENCOUNTER — Other Ambulatory Visit: Payer: Self-pay | Admitting: Family Medicine

## 2017-07-27 ENCOUNTER — Encounter: Payer: Self-pay | Admitting: Family Medicine

## 2017-07-27 VITALS — BP 134/78 | HR 61 | Temp 97.8°F | Resp 16 | Ht 66.0 in | Wt 211.6 lb

## 2017-07-27 DIAGNOSIS — N183 Chronic kidney disease, stage 3 unspecified: Secondary | ICD-10-CM

## 2017-07-27 DIAGNOSIS — I10 Essential (primary) hypertension: Secondary | ICD-10-CM

## 2017-07-27 DIAGNOSIS — E1169 Type 2 diabetes mellitus with other specified complication: Secondary | ICD-10-CM

## 2017-07-27 DIAGNOSIS — Z23 Encounter for immunization: Secondary | ICD-10-CM | POA: Diagnosis not present

## 2017-07-27 DIAGNOSIS — E785 Hyperlipidemia, unspecified: Secondary | ICD-10-CM | POA: Diagnosis not present

## 2017-07-27 DIAGNOSIS — E1121 Type 2 diabetes mellitus with diabetic nephropathy: Secondary | ICD-10-CM

## 2017-07-27 DIAGNOSIS — R600 Localized edema: Secondary | ICD-10-CM | POA: Diagnosis not present

## 2017-07-27 LAB — POCT GLYCOSYLATED HEMOGLOBIN (HGB A1C): Hemoglobin A1C: 6.8 — AB (ref ?–5.6)

## 2017-07-27 MED ORDER — SIMVASTATIN 10 MG PO TABS: 10.0000 mg | ORAL_TABLET | Freq: Every day | ORAL | 3 refills | Status: DC

## 2017-07-27 MED ORDER — METFORMIN HCL 500 MG PO TABS: 500.0000 mg | ORAL_TABLET | Freq: Every day | ORAL | 3 refills | Status: DC

## 2017-07-27 MED ORDER — METOPROLOL TARTRATE 100 MG PO TABS: 100.0000 mg | ORAL_TABLET | Freq: Two times a day (BID) | ORAL | 3 refills | Status: DC

## 2017-07-27 NOTE — Progress Notes (Signed)
Subjective:    Patient ID: Sharon Henderson, female    DOB: March 12, 1941, 76 y.o.   MRN: [id number]  Sharon Henderson is a 76 y.o. female presenting on 07/27/2017 for Diabetes   HPI   CHRONIC DM, Type 2 with CKD-III Reports no concerns Meds: Metformin 500mg  once daily with food Reports good compliance. Tolerating well w/o side-effects Currently on ARB, Statin, Aspirin 81 Lifestyle: - Diet (No changes - still doing well with DM diet with low carb, limits portion, drinks mostly water) - Exercise (lives in retirement home, some walking most days) Due for DM Eye exam - [facility], she will try to schedule - admits she was unable to due to Pulmonary Rehab being busy Denies hypoglycemia, polyuria, visual changes, numbness or tingling.  CHRONIC HTN, with Peripheral Lower Extremity Edema Reports occasional home BP check normal range. Not checking as frequently anymore. Remains off Amlodipine. Current Meds - Losartan 100mg  , Metoprolol 100mg  BID - Also takes Furosemide 40mg  BID everyday (rarely takes 2 pills in AM and 1 in PM), has not tried reduced dose Reports good compliance, took meds today. Tolerating well, w/o complaints. - For Edema, she tries compression and elevation, with some improvement, difficulty wearing compression all the time Denies CP, dyspnea, HA, dizziness / lightheadedness  COPD / Asthma - Followed by [facility], last seen 07/05/2017, has been initiated on Pulmonary Rehab program regularly, and seems to be improving on this. She states this is scheduled for a while to improve her lung function. Also admits it will help her edema in legs by increasing activity. - Currently using albuterol rescue inhaler (ventolin) up to max 3 times daily still on bad days, lots of asthma triggers including scents, hopes to reduce dose with rehab  COPD / Asthma - Pulmonology referred her to [facility] at hospital, she has been using Albuterol Ventolin inhaler regularly at worse up to  3 x daily, but hoping to use less in future, sensitive to most scents and triggers  Health Maintenance: - Due for Flu Shot, will receive today - Due for DEXA, has not scheduled yet, previously discussed, initial screening for osteoporosis. She admits has been busy with Pulm Rehab will reconsider this in future  Depression screen [facility] 2/9 07/17/2017 04/04/2017 04/18/2016  Decreased Interest 0 2 0  Down, Depressed, Hopeless 0 3 0  PHQ - 2 Score 0 5 0  Altered sleeping 0 0 -  Tired, decreased energy 1 0 -  Change in appetite 0 0 -  Feeling bad or failure about yourself  1 1 -  Trouble concentrating 1 1 -  Moving slowly or fidgety/restless 0 0 -  Suicidal thoughts 0 0 -  PHQ-9 Score 3 7 -  Difficult doing work/chores Not difficult at all Not difficult at all -    Social History  Substance Use Topics  . Smoking status: Never Smoker  . Smokeless tobacco: Never Used  . Alcohol use No    Review of Systems Per HPI unless specifically indicated above     Objective:    BP 134/78   Pulse 61   Temp 97.8 F (36.6 C) (Oral)   Resp 16   Ht 5\' 6"  (1.676 m)   Wt 211 lb 9.6 oz (96 kg)   BMI 34.15 kg/m   Wt Readings from Last 3 Encounters:  07/27/17 211 lb 9.6 oz (96 kg)  07/17/17 211 lb 8 oz (95.9 kg)  04/20/17 212 lb (96.2 kg)    Physical  Exam  Constitutional: She is oriented to person, place, and time. She appears well-developed and well-nourished. No distress.  Well-appearing 76 year old female, comfortable, cooperative  HENT:  Head: Normocephalic and atraumatic.  Mouth/Throat: Oropharynx is clear and moist.  Eyes: Conjunctivae are normal. Right eye exhibits no discharge. Left eye exhibits no discharge.  Neck: Normal range of motion. Neck supple. No thyromegaly present.  No carotid bruits  Cardiovascular: Normal rate, regular rhythm, normal heart sounds and intact distal pulses.   No murmur heard. Pulmonary/Chest: Effort normal and breath sounds normal. No respiratory  distress. She has no wheezes. She has no rales.  Musculoskeletal: Normal range of motion. She exhibits edema (Stable mostly unchanged bilateral lower foot and ankle lower extremity non pitting +2 edema, non tender, no erythema).  Lymphadenopathy:    She has no cervical adenopathy.  Neurological: She is alert and oriented to person, place, and time.  Skin: Skin is warm and dry. No rash noted. She is not diaphoretic. No erythema.  Psychiatric: She has a normal mood and affect. Her behavior is normal.  Well groomed, good eye contact, normal speech and thoughts  Nursing note and vitals reviewed.     Recent Labs  01/17/17 1152 04/20/17 1106 07/27/17 1120  HGBA1C 6.9 7.1* 6.8*    Results for orders placed or performed in visit on 07/27/17  POCT HgB A1C  Result Value Ref Range   Hemoglobin A1C 6.8 (A) 5.6      Assessment & Plan:   Problem List Items Addressed This Visit    Bilateral lower extremity edema    Stable to improved Off Amlodipine Still on Lasix regularly, advised again to taper down, improve conservative approach RICE therapy Follow-up Cr trend, then may need ECHO in future if additional symptoms      CKD (chronic kidney disease), stage III (HCC)    Stable CKD, secondary to likely HTN, DM, Age  Plan: 1. Continue to control BP, A1c 2. Improve hydration 3. Remain off oral NSAIDs 4. Follow-up 3 months labs Cr trend - discuss taper down on Lasix further      Essential hypertension    Controlled HTN at goal - Home BP readings reported normal Complication with CKD-III Failed Amlodipine - edema   Plan:  1. Continue current BP regimen Losartan 100mg  daily, Metoprolol 100mg  BID - May continue Furosemide 20-40mg  BID for edema, should try taper 2. Encourage improved lifestyle - low sodium diet, regular exercise 3. If able, start monitor BP outside office, bring readings to next visit, if persistently >150/90 or new symptoms notify office sooner 4. 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 Continue current therapy - Metformin 500mg  daily - refilled 2. Encourage improved lifestyle - low carb, low sugar diet, reduce portion size, continue improving regular exercise 3. Continue ASA, ARB, Statin 4. Advised to schedule DM ophtho exam, send  record 5. Follow-up 3 months annual + labs A1c - then space out q 6 mo      Relevant Medications   simvastatin (ZOCOR) 10 MG tablet   metFORMIN (GLUCOPHAGE) 500 MG tablet   Other Relevant Orders   POCT HgB A1C (Completed)    Other Visit Diagnoses    Needs flu shot       Relevant Orders   Flu vaccine HIGH DOSE PF (Completed)      Meds ordered this encounter  Medications  . ipratropium-albuterol (DUONEB) 0.5-2.5 (3) MG/3ML SOLN    Sig: Take 3 mLs by nebulization 4 (four) times daily.    Refill:  12  . simvastatin (ZOCOR) 10 MG tablet    Sig: Take 1 tablet (10 mg total) by mouth at bedtime.    Dispense:  90 tablet    Refill:  3    Please keep refills on file if patient not ready for new rx  . metoprolol tartrate (LOPRESSOR) 100 MG tablet    Sig: Take 1 tablet (100 mg total) by mouth 2 (two) times daily.    Dispense:  180 tablet    Refill:  3    Please keep refills on file if patient not ready for new rx  . metFORMIN (GLUCOPHAGE) 500 MG tablet    Sig: Take 1 tablet (500 mg total) by mouth daily with breakfast.    Dispense:  90  tablet    Refill:  3    Please keep refills on file if patient not ready for new rx  . albuterol (PROVENTIL HFA;VENTOLIN HFA) 108 (90 Base) MCG/ACT inhaler    Sig: Inhale 2 puffs into the lungs every 4 (four) hours as needed for wheezing or shortness of breath (cough).    Dispense:  1 Inhaler    Refill:  0    Follow up plan: Return in about 3 months (around 10/27/2017) for Annual Physical.  Future labs ordered for 10/23/17.  Saralyn Pilar, DO [facility] 07/27/2017, 3:59 PM

## 2017-07-27 NOTE — Assessment & Plan Note (Signed)
Controlled HTN at goal - Home BP readings reported normal Complication with CKD-III Failed Amlodipine - edema   Plan:  1. Continue current BP regimen Losartan 100mg  daily, Metoprolol 100mg  BID - May continue Furosemide 20-40mg  BID for edema, should try taper 2. Encourage improved lifestyle - low sodium diet, regular exercise 3. If able, start monitor BP outside office, bring readings to next visit, if persistently >150/90 or new symptoms notify office sooner 4. Follow-up 3 months annual + labs

## 2017-07-27 NOTE — Assessment & Plan Note (Signed)
Well-controlled DM with A1c 6.8 (improved from prior >7) Complications - CKD-III, peripheral neuropathy  Plan:  1. Continue current therapy - Metformin 500mg  daily - refilled 2. Encourage improved lifestyle - low carb, low sugar diet, reduce portion size, continue improving regular exercise 3. Continue ASA, ARB, Statin 4. Advised to schedule DM ophtho exam, send record 5. Follow-up 3 months annual + labs A1c - then space out q 6 mo

## 2017-07-27 NOTE — Assessment & Plan Note (Signed)
Stable CKD, secondary to likely HTN, DM, Age  Plan: 1. Continue to control BP, A1c 2. Improve hydration 3. Remain off oral NSAIDs 4. Follow-up 3 months labs Cr trend - discuss taper down on Lasix further

## 2017-07-27 NOTE — Assessment & Plan Note (Signed)
Stable Refilled simvastatin Due for lipids in 3 months labs + annual

## 2017-07-27 NOTE — Patient Instructions (Addendum)
Thank you for coming to the clinic today.  1. Great job, A1c 6.8 - keep up the good work  BP is controlled  [facility]  Keep up the good work with pulmonary rehab  ---------------  For DEXA Scan (Bone mineral density) screening for osteoporosis  Call the Imaging Center below anytime to schedule your own appointment now that order has been placed.  [facility] [address] [place], [place] [address] Phone: [phone]  Please schedule a Follow-up Appointment to: Return in about 3 months (around 10/27/2017) for Annual Physical.  If you have any other questions or concerns, please feel free to call the clinic or send a message through MyChart. You may also schedule an earlier appointment if necessary.  Additionally, you may be receiving a survey about your experience at our clinic within a few days to 1 week by e-mail or mail. We value your feedback.  Saralyn PilarAlexander Karamalegos, DO [facility]

## 2017-07-27 NOTE — Assessment & Plan Note (Signed)
Stable to improved Off Amlodipine Still on Lasix regularly, advised again to taper down, improve conservative approach RICE therapy Follow-up Cr trend, then may need ECHO in future if additional symptoms

## 2017-07-28 ENCOUNTER — Encounter: Payer: Medicare HMO | Attending: Pulmonary Disease

## 2017-07-28 DIAGNOSIS — J449 Chronic obstructive pulmonary disease, unspecified: Secondary | ICD-10-CM | POA: Insufficient documentation

## 2017-07-28 NOTE — Progress Notes (Signed)
Daily Session Note  Patient Details  Name: Sharon Henderson MRN: [id number] Date of Birth: July 09, 1941 Referring Provider:     Pulmonary Rehab from 07/17/2017 in [facility] Cardiac and Pulmonary Rehab  Referring Provider  Ernst Breach MD      Encounter Date: 07/28/2017  Check In:     Session Check In - 07/28/17 1103      Check-In   Location [facility]-Cardiac & Pulmonary Rehab   Staff Present Alberteen Sam, MA, ACSM RCEP, Exercise Physiologist;Joseph Flavia Shipper   Supervising physician immediately available to respond to emergencies [facility] immediately available ER MD   Physician(s) Dr. Burlene Arnt and Clearnce Hasten   Medication changes reported     No   Fall or balance concerns reported    No   Tobacco Cessation No Change   Warm-up and Cool-down Performed as group-led instruction   Resistance Training Performed Yes   VAD Patient? No     Pain Assessment   Currently in Pain? No/denies         History  Smoking Status  . Never Smoker  Smokeless Tobacco  . Never Used    Goals Met:  Exercise tolerated well No report of cardiac concerns or symptoms Strength training completed today  Goals Unmet:  Not Applicable  Comments: Pt able to follow exercise prescription today without complaint.  Will continue to monitor for progression.   Dr. Emily Filbert is Medical Director for [place] and [facility].

## 2017-07-31 DIAGNOSIS — J449 Chronic obstructive pulmonary disease, unspecified: Secondary | ICD-10-CM

## 2017-07-31 NOTE — Progress Notes (Signed)
Daily Session Note  Patient Details  Name: Sharon Henderson MRN: [id number] Date of Birth: May 26, 1941 Referring Provider:     Pulmonary Rehab from 07/17/2017 in [facility] Cardiac and Pulmonary Rehab  Referring Provider  Ernst Breach MD      Encounter Date: 07/31/2017  Check In: Session Check In - 07/31/17 1154      Check-In   Location  [facility]-Cardiac & Pulmonary Rehab    Staff Present  Earlean Shawl, BS, ACSM CEP, Exercise Physiologist;Amanda Oletta Darter, BA, ACSM CEP, Exercise Physiologist;Alice Burnside Flavia Shipper    Supervising physician immediately available to respond to emergencies  [facility] immediately available ER MD    Physician(s)  Dr. Jimmye Norman and Alfred Levins    Medication changes reported      No    Fall or balance concerns reported     No    Warm-up and Cool-down  Performed as group-led instruction    Resistance Training Performed  Yes    VAD Patient?  No      Pain Assessment   Currently in Pain?  No/denies          Social History   Tobacco Use  Smoking Status Never Smoker  Smokeless Tobacco Never Used    Goals Met:  Independence with exercise equipment Exercise tolerated well No report of cardiac concerns or symptoms Strength training completed today  Goals Unmet:  Not Applicable  Comments: Pt able to follow exercise prescription today without complaint.  Will continue to monitor for progression.   Dr. Emily Filbert is Medical Director for [place] and [facility].

## 2017-08-07 DIAGNOSIS — J449 Chronic obstructive pulmonary disease, unspecified: Secondary | ICD-10-CM

## 2017-08-07 NOTE — Progress Notes (Signed)
Daily Session Note  Patient Details  Name: Sharon Henderson MRN: [id number] Date of Birth: [birth year] Referring Provider:     Pulmonary Rehab from 07/17/2017 in [facility] Cardiac and Pulmonary Rehab  Referring Provider  Ernst Breach MD      Encounter Date: 08/07/2017  Check In: Session Check In - 08/07/17 1143      Check-In   Location  [facility]Cardiac & Pulmonary Rehab    Staff Present  Earlean Shawl, BS, ACSM CEP, Exercise Physiologist;Amanda Oletta Darter, BA, ACSM CEP, Exercise Physiologist;Dixie Coppa Flavia Shipper    Supervising physician immediately available to respond to emergencies  [facility] immediately available ER MD    Physician(s)  Dr. Burlene Arnt and Archie Balboa    Medication changes reported      No    Fall or balance concerns reported     No    Warm-up and Cool-down  Performed as group-led instruction    Resistance Training Performed  Yes    VAD Patient?  No      Pain Assessment   Currently in Pain?  No/denies          Social History   Tobacco Use  Smoking Status Never Smoker  Smokeless Tobacco Never Used    Goals Met:  Exercise tolerated well No report of cardiac concerns or symptoms Strength training completed today  Goals Unmet:  Not Applicable  Comments: Pt able to follow exercise prescription today without complaint.  Will continue to monitor for progression.   Dr. Emily Filbert is Medical Director for [place] and [facility].

## 2017-08-11 DIAGNOSIS — J449 Chronic obstructive pulmonary disease, unspecified: Secondary | ICD-10-CM

## 2017-08-11 DIAGNOSIS — J4540 Moderate persistent asthma, uncomplicated: Secondary | ICD-10-CM | POA: Diagnosis not present

## 2017-08-11 NOTE — Progress Notes (Signed)
Daily Session Note  Patient Details  Name: Sharon Henderson MRN: [id number] Date of Birth: 01/18/1941 Referring Provider:     Pulmonary Rehab from 07/17/2017 in [facility] Cardiac and Pulmonary Rehab  Referring Provider  Ernst Breach MD      Encounter Date: 08/11/2017  Check In: Session Check In - 08/11/17 1127      Check-In   Location  [facility]-Cardiac & Pulmonary Rehab    Staff Present  Renita Papa, RN BSN;Joseph Darrin Nipper, [place], ACSM RCEP, Exercise Physiologist    Supervising physician immediately available to respond to emergencies  [facility] immediately available ER MD    Physician(s)  Dr. Corky Downs and Burlene Arnt    Medication changes reported      No    Fall or balance concerns reported     No    Warm-up and Cool-down  Performed as group-led instruction    Resistance Training Performed  Yes    VAD Patient?  No      Pain Assessment   Currently in Pain?  No/denies          Social History   Tobacco Use  Smoking Status Never Smoker  Smokeless Tobacco Never Used    Goals Met:  Proper associated with RPD/PD & O2 Sat Independence with exercise equipment Using PLB without cueing & demonstrates good technique Exercise tolerated well Personal goals reviewed No report of cardiac concerns or symptoms Strength training completed today  Goals Unmet:  Not Applicable  Comments: Pt able to follow exercise prescription today without complaint.  Will continue to monitor for progression. See ITP for goal review Reviewed home exercise with pt today.  Pt plans to walk at home and join the [facility] for exercise.  She is going to start with one extra day a week at home for a total of three days a week.  We also about coming in to 10am [facility] class on Wednesdays as she has a Surveyor, mining.  Reviewed THR, pulse, RPE, sign and symptoms, NTG use, and when to call 911 or MD.  Also discussed weather considerations and indoor options.  Pt voiced  understanding.   Dr. Emily Filbert is Medical Director for [place] and [facility].

## 2017-08-14 DIAGNOSIS — J449 Chronic obstructive pulmonary disease, unspecified: Secondary | ICD-10-CM

## 2017-08-14 NOTE — Progress Notes (Signed)
Pulmonary Individual Treatment Plan  Patient Details  Name: Sharon Henderson MRN: [id number] Date of Birth: 06/11/1941 Referring Provider:     Pulmonary Rehab from 07/17/2017 in [facility] Cardiac and Pulmonary Rehab  Referring Provider  Jaclynn Major MD      Initial Encounter Date:    Pulmonary Rehab from 07/17/2017 in [facility] Cardiac and Pulmonary Rehab  Date  07/17/17  Referring Provider  Jaclynn Major MD      Visit Diagnosis: Asthma with chronic obstructive pulmonary disease (COPD) (HCC)  Patient's Home Medications on Admission:  Current Outpatient Medications:  .  acetaminophen (TYLENOL) 500 MG tablet, Take 1,000 mg by mouth every 8 (eight) hours as needed., Disp: , Rfl:  .  albuterol (PROVENTIL HFA;VENTOLIN HFA) 108 (90 Base) MCG/ACT inhaler, Inhale 2 puffs into the lungs every 4 (four) hours as needed for wheezing or shortness of breath (cough)., Disp: 1 Inhaler, Rfl: 0 .  aspirin EC 81 MG tablet, Take by mouth., Disp: , Rfl:  .  budesonide-formoterol (SYMBICORT) 160-4.5 MCG/ACT inhaler, INHALE 2 INHALATIONS INTO THE LUNGS 2 (TWO) TIMES DAILY., Disp: 10.2 Inhaler, Rfl: 6 .  cetirizine (ZYRTEC) 10 MG tablet, TAKE 1 TABLET (10 MG TOTAL) BY MOUTH DAILY., Disp: 90 tablet, Rfl: 2 .  fluticasone (FLONASE) 50 MCG/ACT nasal spray, PLACE 1-2 SPRAYS INTO BOTH NOSTRILS DAILY., Disp: 16 g, Rfl: 3 .  furosemide (LASIX) 40 MG tablet, Take 1 tablet twice a day except with extra swelling, then take 2 tabs in AM and 1 in early afternoon.  Take extra tabs no more than 5 days., Disp: 70 tablet, Rfl: 5 .  ibuprofen (ADVIL,MOTRIN) 200 MG tablet, Take by mouth., Disp: , Rfl:  .  ipratropium-albuterol (DUONEB) 0.5-2.5 (3) MG/3ML SOLN, Take 3 mLs by nebulization 4 (four) times daily., Disp: , Rfl: 12 .  losartan (COZAAR) 100 MG tablet, Take 1 tablet (100 mg total) by mouth daily., Disp: 90 tablet, Rfl: 3 .  metFORMIN (GLUCOPHAGE) 500 MG tablet, Take 1 tablet (500 mg total) by mouth daily with breakfast., Disp:  90 tablet, Rfl: 3 .  metoprolol tartrate (LOPRESSOR) 100 MG tablet, Take 1 tablet (100 mg total) by mouth 2 (two) times daily., Disp: 180 tablet, Rfl: 3 .  montelukast (SINGULAIR) 10 MG tablet, TAKE 1 TABLET (10 MG TOTAL) BY MOUTH NIGHTLY. FOR ALLERGIES, Disp: 30 tablet, Rfl: 12 .  simvastatin (ZOCOR) 10 MG tablet, Take 1 tablet (10 mg total) by mouth at bedtime., Disp: 90 tablet, Rfl: 3 .  tiotropium (SPIRIVA) 18 MCG inhalation capsule, Place into inhaler and inhale., Disp: , Rfl:   Past Medical History: Past Medical History:  Diagnosis Date  . Allergic rhinitis   . Asthma   . Diabetes (HCC)    type 2  . Diverticulosis   . Hypertension   . Internal hemorrhoids     Tobacco Use: Social History   Tobacco Use  Smoking Status Never Smoker  Smokeless Tobacco Never Used    Labs: Recent Review Flowsheet Data    Labs for ITP Cardiac and Pulmonary Rehab Latest Ref Rng & Units 10/06/2016 10/11/2016 01/17/2017 04/20/2017 07/27/2017   Cholestrol <200 mg/dL - 409(W) - - -   LDLCALC <100 mg/dL - 119(J) - - -   HDL >47 mg/dL - 61 - - -   Trlycerides <150 mg/dL - 87 - - -   Hemoglobin A1c 5.6 6.9% - 6.9 7.1(A) 6.8(A)       Pulmonary Assessment Scores: Pulmonary Assessment Scores    Row Name 07/17/17  1415         ADL UCSD   ADL Phase  Entry     SOB Score total  89     Rest  3     Walk  5     Stairs  5     Bath  3     Dress  3     Shop  3       CAT Score   CAT Score  31       mMRC Score   mMRC Score  3        Pulmonary Function Assessment: Pulmonary Function Assessment - 07/17/17 1445      Initial Spirometry Results   FVC%  50 %    FEV1%  52 %    FEV1/FVC Ratio  81.24    Comments  best of 2 results, good patient effort      Post Bronchodilator Spirometry Results   FVC%  43 %    FEV1%  42.64 %    FEV1/FVC Ratio  78.02    Comments  best of 2 results, good patient effort      Breath   Bilateral Breath Sounds  Clear slight wheezes in bases    Shortness of Breath   Limiting activity;Fear of Shortness of Breath;No;Panic with Shortness of Breath       Exercise Target Goals:    Exercise Program Goal: Individual exercise prescription set with THRR, safety & activity barriers. Participant demonstrates ability to understand and report RPE using BORG scale, to self-measure pulse accurately, and to acknowledge the importance of the exercise prescription.  Exercise Prescription Goal: Starting with aerobic activity 30 plus minutes a day, 3 days per week for initial exercise prescription. Provide home exercise prescription and guidelines that participant acknowledges understanding prior to discharge.  Activity Barriers & Risk Stratification: Activity Barriers & Cardiac Risk Stratification - 07/17/17 1536      Activity Barriers & Cardiac Risk Stratification   Activity Barriers  Shortness of Breath;Muscular Weakness;Balance Concerns;Other (comment)    Comments  Left Knee stiffness - residual to a past accident        6 Minute Walk: 6 Minute Walk    Row Name 07/17/17 1532         6 Minute Walk   Phase  Initial     Distance  433 feet     Walk Time  4.91 minutes     # of Rest Breaks  1 test was stopped at 4:55      MPH  1     METS  0.59     RPE  11     Perceived Dyspnea   2     VO2 Peak  2.08     Symptoms  Yes (comment)     Comments  Shortness of breath      Resting HR  57 bpm     Resting BP  128/72     Resting Oxygen Saturation   98 %     Exercise Oxygen Saturation  during 6 min walk  95 %     Max Ex. HR  113 bpm     Max Ex. BP  124/70     2 Minute Post BP  116/64       Interval HR   1 Minute HR  94     2 Minute HR  89     3 Minute HR  103     4 Minute HR  105     5 Minute HR  113 test stopped at 4:55     6 Minute HR  82     2 Minute Post HR  66     Interval Heart Rate?  Yes       Interval Oxygen   Interval Oxygen?  Yes     Baseline Oxygen Saturation %  98 %     1 Minute Oxygen Saturation %  100 %     1 Minute Liters of Oxygen  0  L Room Air     2 Minute Oxygen Saturation %  98 %     2 Minute Liters of Oxygen  0 L     3 Minute Oxygen Saturation %  95 %     3 Minute Liters of Oxygen  0 L     4 Minute Oxygen Saturation %  95 %     4 Minute Liters of Oxygen  0 L     5 Minute Oxygen Saturation %  95 %     5 Minute Liters of Oxygen  0 L     6 Minute Oxygen Saturation %  96 %     6 Minute Liters of Oxygen  0 L     2 Minute Post Oxygen Saturation %  100 %     2 Minute Post Liters of Oxygen  0 L       Oxygen Initial Assessment: Oxygen Initial Assessment - 07/17/17 1400      Home Oxygen   Home Oxygen Device  None    Sleep Oxygen Prescription  None    Home Exercise Oxygen Prescription  None    Home at Rest Exercise Oxygen Prescription  None    Compliance with Home Oxygen Use  -- does not have home oxygen      Initial 6 min Walk   Oxygen Used  None      Program Oxygen Prescription   Program Oxygen Prescription  None      Intervention   Short Term Goals  To learn and understand importance of maintaining oxygen saturations>88%;To learn and demonstrate proper use of respiratory medications;To learn and demonstrate proper pursed lip breathing techniques or other breathing techniques.;To learn and understand importance of monitoring SPO2 with pulse oximeter and demonstrate accurate use of the pulse oximeter.    Long  Term Goals  Verbalizes importance of monitoring SPO2 with pulse oximeter and return demonstration;Exhibits proper breathing techniques, such as pursed lip breathing or other method taught during program session;Demonstrates proper use of MDI's;Maintenance of O2 saturations>88%;Compliance with respiratory medication       Oxygen Re-Evaluation: Oxygen Re-Evaluation    Row Name 07/24/17 1151             Program Oxygen Prescription   Program Oxygen Prescription  None         Home Oxygen   Home Oxygen Device  None       Sleep Oxygen Prescription  None       Home Exercise Oxygen Prescription  None        Home at Rest Exercise Oxygen Prescription  None         Goals/Expected Outcomes   Short Term Goals  To learn and understand importance of maintaining oxygen saturations>88%;To learn and demonstrate proper use of respiratory medications;To learn and demonstrate proper pursed lip breathing techniques or other breathing techniques.;To learn and understand importance of monitoring SPO2 with pulse oximeter and demonstrate accurate use of the  pulse oximeter.       Long  Term Goals  Verbalizes importance of monitoring SPO2 with pulse oximeter and return demonstration;Exhibits proper breathing techniques, such as pursed lip breathing or other method taught during program session;Compliance with respiratory medication;Maintenance of O2 saturations>88%;Demonstrates proper use of MDI's       Comments  Reviewed PLB technique with pt.  Talked about how it work and it's important to maintaining his exercise saturations.         Goals/Expected Outcomes  Short: Become more profiecient at using PLB.   Long: Become independent at using PLB.          Oxygen Discharge (Final Oxygen Re-Evaluation): Oxygen Re-Evaluation - 07/24/17 1151      Program Oxygen Prescription   Program Oxygen Prescription  None      Home Oxygen   Home Oxygen Device  None    Sleep Oxygen Prescription  None    Home Exercise Oxygen Prescription  None    Home at Rest Exercise Oxygen Prescription  None      Goals/Expected Outcomes   Short Term Goals  To learn and understand importance of maintaining oxygen saturations>88%;To learn and demonstrate proper use of respiratory medications;To learn and demonstrate proper pursed lip breathing techniques or other breathing techniques.;To learn and understand importance of monitoring SPO2 with pulse oximeter and demonstrate accurate use of the pulse oximeter.    Long  Term Goals  Verbalizes importance of monitoring SPO2 with pulse oximeter and return demonstration;Exhibits proper breathing  techniques, such as pursed lip breathing or other method taught during program session;Compliance with respiratory medication;Maintenance of O2 saturations>88%;Demonstrates proper use of MDI's    Comments  Reviewed PLB technique with pt.  Talked about how it work and it's important to maintaining his exercise saturations.      Goals/Expected Outcomes  Short: Become more profiecient at using PLB.   Long: Become independent at using PLB.       Initial Exercise Prescription: Initial Exercise Prescription - 07/17/17 1500      Date of Initial Exercise RX and Referring Provider   Date  07/17/17    Referring Provider  Jaclynn Major MD      Treadmill   MPH  1    Grade  0    Minutes  15 5 intervals of 3 minutes     METs  1.77      NuStep   Level  1    SPM  80    Minutes  15    METs  3      Arm Ergometer   Level  1    Watts  5    RPM  25    Minutes  15    METs  3      Prescription Details   Frequency (times per week)  2    Duration  Progress to 45 minutes of aerobic exercise without signs/symptoms of physical distress      Intensity   THRR 40-80% of Max Heartrate  91-125    Ratings of Perceived Exertion  11-13    Perceived Dyspnea  0-4      Progression   Progression  Continue to progress workloads to maintain intensity without signs/symptoms of physical distress.      Resistance Training   Training Prescription  Yes    Weight  2    Reps  10-15       Perform Capillary Blood Glucose checks as needed.  Exercise Prescription Changes: Exercise  Prescription Changes    Row Name 07/17/17 1500 08/01/17 1500 08/11/17 1400         Response to Exercise   Blood Pressure (Admit)  128/72  134/72  -     Blood Pressure (Exercise)  124/70  -  -     Blood Pressure (Exit)  116/64  120/58  -     Heart Rate (Admit)  57 bpm  66 bpm  -     Heart Rate (Exercise)  113 bpm  79 bpm  -     Heart Rate (Exit)  66 bpm  75 bpm  -     Oxygen Saturation (Admit)  98 %  99 %  -     Oxygen  Saturation (Exercise)  95 %  99 %  -     Oxygen Saturation (Exit)  100 %  100 %  -     Rating of Perceived Exertion (Exercise)  11  13  -     Perceived Dyspnea (Exercise)  2  3  -     Symptoms  SOB   SOB  -     Comments  walk test completed  -  -     Duration  -  Continue with 45 min of aerobic exercise without signs/symptoms of physical distress.  -     Intensity  -  THRR unchanged  -       Progression   Progression  -  Continue to progress workloads to maintain intensity without signs/symptoms of physical distress.  -     Average METs  -  1.97  -       Resistance Training   Training Prescription  -  Yes  -     Weight  -  2 lbs  -     Reps  -  10-15  -       Interval Training   Interval Training  -  No  -       NuStep   Level  -  1  -     SPM  -  47  -     Minutes  -  15  -     METs  -  2.1  -       Arm Ergometer   Level  -  1  -     RPM  -  42  -     Minutes  -  15  -     METs  -  2.2  -       Track   Laps  -  13  -     Minutes  -  15  -     METs  -  1.6  -       Home Exercise Plan   Plans to continue exercise at  -  -  [facility]     Frequency  -  -  Add 1 additional day to program exercise sessions.     Initial Home Exercises Provided  -  -  08/11/17        Exercise Comments: Exercise Comments    Row Name 07/24/17 1150           Exercise Comments   First full day of exercise!  Patient was oriented to gym and equipment including functions, settings, policies, and procedures.  Patient's individual exercise prescription and treatment plan were reviewed.  All starting workloads were established based  on the results of the 6 minute walk test done at initial orientation visit.  The plan for exercise progression was also introduced and progression will be customized based on patient's performance and goals.          Exercise Goals and Review: Exercise Goals    Row Name 07/17/17 1544             Exercise Goals   Increase Physical  Activity  Yes       Intervention  Provide advice, education, support and counseling about physical activity/exercise needs.;Develop an individualized exercise prescription for aerobic and resistive training based on initial evaluation findings, risk stratification, comorbidities and participant's personal goals.       Expected Outcomes  Achievement of increased cardiorespiratory fitness and enhanced flexibility, muscular endurance and strength shown through measurements of functional capacity and personal statement of participant.       Increase Strength and Stamina  Yes       Intervention  Provide advice, education, support and counseling about physical activity/exercise needs.;Develop an individualized exercise prescription for aerobic and resistive training based on initial evaluation findings, risk stratification, comorbidities and participant's personal goals.       Expected Outcomes  Achievement of increased cardiorespiratory fitness and enhanced flexibility, muscular endurance and strength shown through measurements of functional capacity and personal statement of participant.       Able to understand and use rate of perceived exertion (RPE) scale  Yes       Intervention  Provide education and explanation on how to use RPE scale       Expected Outcomes  Short Term: Able to use RPE daily in rehab to express subjective intensity level;Long Term:  Able to use RPE to guide intensity level when exercising independently       Able to understand and use Dyspnea scale  Yes       Intervention  Provide education and explanation on how to use Dyspnea scale       Expected Outcomes  Short Term: Able to use Dyspnea scale daily in rehab to express subjective sense of shortness of breath during exertion;Long Term: Able to use Dyspnea scale to guide intensity level when exercising independently       Knowledge and understanding of Target Heart Rate Range (THRR)  Yes       Intervention  Provide education and  explanation of THRR including how the numbers were predicted and where they are located for reference       Expected Outcomes  Short Term: Able to state/look up THRR;Long Term: Able to use THRR to govern intensity when exercising independently;Short Term: Able to use daily as guideline for intensity in rehab       Able to check pulse independently  Yes       Intervention  Provide education and demonstration on how to check pulse in carotid and radial arteries.;Review the importance of being able to check your own pulse for safety during independent exercise       Expected Outcomes  Short Term: Able to explain why pulse checking is important during independent exercise;Long Term: Able to check pulse independently and accurately       Understanding of Exercise Prescription  Yes       Intervention  Provide education, explanation, and written materials on patient's individual exercise prescription       Expected Outcomes  Short Term: Able to explain program exercise prescription;Long Term: Able to explain home exercise prescription to exercise  independently          Exercise Goals Re-Evaluation : Exercise Goals Re-Evaluation    Row Name 07/24/17 1150 08/01/17 1553 08/11/17 1408         Exercise Goal Re-Evaluation   Exercise Goals Review  Understanding of Exercise Prescription;Knowledge and understanding of Target Heart Rate Range (THRR);Able to understand and use Dyspnea scale;Able to understand and use rate of perceived exertion (RPE) scale  Increase Physical Activity;Increase Strength and Stamina;Understanding of Exercise Prescription  Increase Physical Activity;Increase Strength and Stamina;Understanding of Exercise Prescription;Knowledge and understanding of Target Heart Rate Range (THRR);Able to understand and use Dyspnea scale;Able to check pulse independently;Able to understand and use rate of perceived exertion (RPE) scale     Comments  Reviewed RPE scale, THR and program prescription with pt  today.  Pt voiced understanding and was given a copy of goals to take home.   Sharon Henderson is off to a good start in rehab.  She has completed three full days of exercise.  She is already getting in 13 laps on the track.  We will continue to monitor her progression.   Reviewed home exercise with pt today.  Pt plans to walk at home and join the [facility] for exercise.  She is going to start with one extra day a week at home for a total of three days a week.  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  Reviewed THR, pulse, RPE, sign and symptoms, NTG use, and when to call 911 or MD.  Also discussed weather considerations and indoor options.  Pt voiced understanding. She has also mentioned that her knees have been hurting after exercise.  We talked about icing her knees down after exercise.     Expected Outcomes  Short: Use RPE daily to regulate intensity.  Long: Follow program prescription in THR.  Short: Start to increase workloads and review home exercise guidelines.  Long: Continue to attend rehab regularly.   Short: Add in at least one extra day at home for three days a week. Ice knees after exercise.   Long: Try to exercise more at home consistently        Discharge Exercise Prescription (Final Exercise Prescription Changes): Exercise Prescription Changes - 08/11/17 1400      Home Exercise Plan   Plans to continue exercise at  [facility]    Frequency  Add 1 additional day to program exercise sessions.    Initial Home Exercises Provided  08/11/17       Nutrition:  Target Goals: Understanding of nutrition guidelines, daily intake of sodium 1500mg , cholesterol 200mg , calories 30% from fat and 7% or less from saturated fats, daily to have 5 or more servings of fruits and vegetables.  Biometrics: Pre Biometrics - 07/17/17 1545      Pre Biometrics   Height  5' 4.7" (1.643 m)    Weight  211 lb 8 oz (95.9 kg)    Waist  Circumference  43 inches    Hip Circumference  49.7 inches    Waist to Hip Ratio  0.87 %    BMI (Calculated)  35.54        Nutrition Therapy Plan and Nutrition Goals: Nutrition Therapy & Goals - 07/17/17 1413      Intervention Plan   Intervention  Prescribe, educate and counsel regarding individualized specific dietary modifications aiming towards targeted core components such as weight, hypertension, lipid management, diabetes, heart  failure and other comorbidities.;Nutrition handout(s) given to patient.    Expected Outcomes  Short Term Goal: Understand basic principles of dietary content, such as calories, fat, sodium, cholesterol and nutrients.;Short Term Goal: A plan has been developed with personal nutrition goals set during dietitian appointment.;Long Term Goal: Adherence to prescribed nutrition plan.       Nutrition Discharge: Rate Your Plate Scores: Nutrition Assessments - 07/17/17 1414      MEDFICTS Scores   Pre Score  33       Nutrition Goals Re-Evaluation: Nutrition Goals Re-Evaluation    Row Name 08/11/17 1417             Goals   Nutrition Goal  Weight Loss, Diet to help with weight loss       Comment  Sharon Henderson has an appointment scheduled for Dec 3.  She is already watching her sugar and salt intake; however, she would like to get a boost on her weight loss.       Expected Outcome  Short: Meet with dietician  Long: Follow goal and guidelines laid out by dietician for weight loss          Nutrition Goals Discharge (Final Nutrition Goals Re-Evaluation): Nutrition Goals Re-Evaluation - 08/11/17 1417      Goals   Nutrition Goal  Weight Loss, Diet to help with weight loss    Comment  Sharon Henderson has an appointment scheduled for Dec 3.  She is already watching her sugar and salt intake; however, she would like to get a boost on her weight loss.    Expected Outcome  Short: Meet with dietician  Long: Follow goal and guidelines laid out by dietician for weight loss        Psychosocial: Target Goals: Acknowledge presence or absence of significant depression and/or stress, maximize coping skills, provide positive support system. Participant is able to verbalize types and ability to use techniques and skills needed for reducing stress and depression.   Initial Review & Psychosocial Screening: Initial Psych Review & Screening - 07/17/17 1410      Initial Review   Current issues with  Current Stress Concerns    Source of Stress Concerns  Chronic Illness    Comments  Her ADL's are limited with her disease      Family Dynamics   Good Support System?  Yes    Comments  Her four daughters are a great support system and three sisters.      Barriers   Psychosocial barriers to participate in program  There are no identifiable barriers or psychosocial needs.      Screening Interventions   Interventions  Yes;Encouraged to exercise;Provide feedback about the scores to participant;Program counselor consult    Expected Outcomes  Short Term goal: Utilizing psychosocial counselor, staff and physician to assist with identification of specific Stressors or current issues interfering with healing process. Setting desired goal for each stressor or current issue identified.;Long Term Goal: Stressors or current issues are controlled or eliminated.;Short Term goal: Identification and review with participant of any Quality of Life or Depression concerns found by scoring the questionnaire.;Long Term goal: The participant improves quality of Life and PHQ9 Scores as seen by post scores and/or verbalization of changes       Quality of Life Scores:   PHQ-9: Recent Review Flowsheet Data    Depression screen [facility] 2/9 07/17/2017 04/04/2017 04/18/2016 01/14/2016 08/14/2015   Decreased Interest 0 2 0 0 -   Down, Depressed, Hopeless 0 3 0 0  3   PHQ - 2 Score 0 5 0 0 3   Altered sleeping 0 0 - - 2   Tired, decreased energy 1 0 - - 1   Change in appetite 0 0 - - 1   Feeling bad or  failure about yourself  1 1 - - 0   Trouble concentrating 1 1 - - 1   Moving slowly or fidgety/restless 0 0 - - 0   Suicidal thoughts 0 0 - - 0   PHQ-9 Score 3 7 - - 8   Difficult doing work/chores Not difficult at all Not difficult at all - - -     Interpretation of Total Score  Total Score Depression Severity:  1-4 = Minimal depression, 5-9 = Mild depression, 10-14 = Moderate depression, 15-19 = Moderately severe depression, 20-27 = Severe depression   Psychosocial Evaluation and Intervention:   Psychosocial Re-Evaluation: Psychosocial Re-Evaluation    Row Name 08/11/17 1419             Psychosocial Re-Evaluation   Current issues with  Current Stress Concerns       Comments  Sharon Henderson has been doing well in rehab.  She enjoys coming to rehab.  Her favorite part has been attending the education classes.  She only comes on Mondays and Fridays as she has a church commitment on Wednesdays.  We talked about trying to come to the 10am class on Wednesdays and she is going to think about that option.  She can already tell that she is breatihing better.  She denies depression or anxiety sysmptoms.  She sleeps well and does not have any financial issues.   She has also mentioned that her knees have been hurting after exercise.  We talked about icing her knees down after exercise.       Expected Outcomes  Short: Continue to attend rehab and decide about trying to come on Wednesdays.  Long: Continue to attend rehab and stay positive.       Interventions  Encouraged to attend Cardiac Rehabilitation for the exercise;Stress management education       Continue Psychosocial Services   Follow up required by staff          Psychosocial Discharge (Final Psychosocial Re-Evaluation): Psychosocial Re-Evaluation - 08/11/17 1419      Psychosocial Re-Evaluation   Current issues with  Current Stress Concerns    Comments  Sharon Henderson has been doing well in rehab.  She enjoys coming to rehab.  Her favorite part  has been attending the education classes.  She only comes on Mondays and Fridays as she has a church commitment on Wednesdays.  We talked about trying to come to the 10am class on Wednesdays and she is going to think about that option.  She can already tell that she is breatihing better.  She denies depression or anxiety sysmptoms.  She sleeps well and does not have any financial issues.   She has also mentioned that her knees have been hurting after exercise.  We talked about icing her knees down after exercise.    Expected Outcomes  Short: Continue to attend rehab and decide about trying to come on Wednesdays.  Long: Continue to attend rehab and stay positive.    Interventions  Encouraged to attend Cardiac Rehabilitation for the exercise;Stress management education    Continue Psychosocial Services   Follow up required by staff       Education: Education Goals: Education classes will be provided on a weekly  basis, covering required topics. Participant will state understanding/return demonstration of topics presented.  Learning Barriers/Preferences: Learning Barriers/Preferences - 07/17/17 1449      Learning Barriers/Preferences   Learning Barriers  Sight wears glasses    Learning Preferences  None       Education Topics: Initial Evaluation Education: - Verbal, written and demonstration of respiratory meds, RPE/PD scales, oximetry and breathing techniques. Instruction on use of nebulizers and MDIs: cleaning and proper use, rinsing mouth with steroid doses and importance of monitoring MDI activations.   [facility]  Date  07/17/17  Educ[facility]  [facility]  Instruction Review Code  1- Verbalizes Understanding      General Nutrition Guidelines/Fats and Fiber: -Group instruction provided by verbal, written material, models and posters to present the general guidelines for heart healthy nutrition. Gives an explanation and review of dietary fats  and fiber.   Controlling Sodium/Reading Food Labels: -Group verbal and written material supporting the discussion of sodium use in heart healthy nutrition. Review and explanation with models, verbal and written materials for utilization of the food label.   Exercise Physiology & Risk Factors: - Group verbal and written instruction with models to review the exercise physiology of the cardiovascular system and associated critical values. Details cardiovascular disease risk factors and the goals associated with each risk factor.   Aerobic Exercise & Resistance Training: - Gives group verbal and written discussion on the health impact of inactivity. On the components of aerobic and resistive training programs and the benefits of this training and how to safely progress through these programs.   [facility]  Date  07/28/17  [facility]  Instruction Review Code  1- Verbalizes Understanding      Flexibility, Balance, General Exercise Guidelines: - Provides group verbal and written instruction on the benefits of flexibility and balance training programs. Provides general exercise guidelines with specific guidelines to those with heart or lung disease. Demonstration and skill practice provided.   Stress Management: - Provides group verbal and written instruction about the health risks of elevated stress, cause of high stress, and healthy ways to reduce stress.   Depression: - Provides group verbal and written instruction on the correlation between heart/lung disease and depressed mood, treatment options, and the stigmas associated with seeking treatment.   Exercise & Equipment Safety: - Individual verbal instruction and demonstration of equipment use and safety with use of the equipment.   [facility]  Date  07/17/17  [facility]  [facility]  Instruction Review Code  1- Verbalizes  Understanding      Infection Prevention: - Provides verbal and written material to individual with discussion of infection control including proper hand washing and proper equipment cleaning during exercise session.   [facility]  Date  07/17/17  [facility]  Instruction Review Code  1- Verbalizes Understanding      Falls Prevention: - Provides verbal and written material to individual with discussion of falls prevention and safety.   [facility]  Date  07/17/17  Educ[facility]  [facility]  Instruction Review Code  1- Verbalizes Understanding      Diabetes: - Individual verbal and written instruction to review signs/symptoms of diabetes, desired ranges of glucose level fasting, after meals and with exercise. Advice that pre and post exercise  glucose checks will be done for 3 sessions at entry of program.   Chronic Lung Diseases: - Group verbal and written instruction to review new updates, new respiratory medications, new advancements in procedures and treatments. Provide informative websites and "800" numbers of self-education.   Lung Procedures: - Group verbal and written instruction to describe testing methods done to diagnose lung disease. Review the outcome of test results. Describe the treatment choices: Pulmonary Function Tests, ABGs and oximetry.   Energy Conservation: - Provide group verbal and written instruction for methods to conserve energy, plan and organize activities. Instruct on pacing techniques, use of adaptive equipment and posture/positioning to relieve shortness of breath.   Triggers: - Group verbal and written instruction to review types of environmental controls: home humidity, furnaces, filters, dust mite/pet prevention, HEPA vacuums. To discuss weather changes, air quality and the benefits of nasal washing.   Exacerbations: - Group verbal and written  instruction to provide: warning signs, infection symptoms, calling MD promptly, preventive modes, and value of vaccinations. Review: effective airway clearance, coughing and/or vibration techniques. Create an Sport and exercise psychologist.   Oxygen: - Individual and group verbal and written instruction on oxygen therapy. Includes supplement oxygen, available portable oxygen systems, continuous and intermittent flow rates, oxygen safety, concentrators, and Medicare reimbursement for oxygen.   Pulmonary Rehab from 08/11/2017 in [facility] Cardiac and Pulmonary Rehab  Date  07/17/17  Educator  [facility]  Instruction Review Code  1- Verbalizes Understanding      Respiratory Medications: - Group verbal and written instruction to review medications for lung disease. Drug class, frequency, complications, importance of spacers, rinsing mouth after steroid MDI's, and proper cleaning methods for nebulizers.   Pulmonary Rehab from 08/11/2017 in [facility] Cardiac and Pulmonary Rehab  Date  07/17/17  Educator  [facility]  Instruction Review Code  1- Verbalizes Understanding      AED/CPR: - Group verbal and written instruction with the use of models to demonstrate the basic use of the AED with the basic ABC's of resuscitation.   Breathing Retraining: - Provides individuals verbal and written instruction on purpose, frequency, and proper technique of diaphragmatic breathing and pursed-lipped breathing. Applies individual practice skills.   Pulmonary Rehab from 08/11/2017 in [facility] Cardiac and Pulmonary Rehab  Date  07/17/17  Educator  [facility]  Instruction Review Code  1- Verbalizes Understanding      Anatomy and Physiology of the Lungs: - Group verbal and written instruction with the use of models to provide basic lung anatomy and physiology related to function, structure and complications of lung disease.   Anatomy & Physiology of the Heart: - Group verbal and written instruction and models provide basic cardiac anatomy and physiology, with  the coronary electrical and arterial systems. Review of: AMI, Angina, Valve disease, Heart Failure, Cardiac Arrhythmia, Pacemakers, and the ICD.   Pulmonary Rehab from 08/11/2017 in [facility] Cardiac and Pulmonary Rehab  Date  08/11/17  Educator  [facility]  Instruction Review Code  1- Verbalizes Understanding      Heart Failure: - Group verbal and written instruction on the basics of heart failure: signs/symptoms, treatments, explanation of ejection fraction, enlarged heart and cardiomyopathy.   Pulmonary Rehab from 08/11/2017 in [facility] Cardiac and Pulmonary Rehab  Date  08/11/17  Educator  [facility]  Instruction Review Code  1- Verbalizes Understanding      Sleep Apnea: - Individual verbal and written instruction to review Obstructive Sleep Apnea. Review of risk factors, methods for diagnosing and types of masks and machines for OSA.  Anxiety: - Provides group, verbal and written instruction on the correlation between heart/lung disease and anxiety, treatment options, and management of anxiety.   Relaxation: - Provides group, verbal and written instruction about the benefits of relaxation for patients with heart/lung disease. Also provides patients with examples of relaxation techniques.   Cardiac Medications: - Group verbal and written instruction to review commonly prescribed medications for heart disease. Reviews the medication, class of the drug, and side effects.   Know Your Numbers: -Group verbal and written instruction about important numbers in your health.  Review of Cholesterol, Blood Pressure, Diabetes, and BMI and the role they play in your overall health.   Other: -Provides group and verbal instruction on various topics (see comments)    Knowledge Questionnaire Score: Knowledge Questionnaire Score - 07/17/17 1416      Knowledge Questionnaire Score   Pre Score  7/10        Core Components/Risk Factors/Patient Goals at Admission: Personal Goals and Risk Factors at  Admission - 07/17/17 1431      Core Components/Risk Factors/Patient Goals on Admission    Weight Management  Yes;Weight Loss    Intervention  Weight Management: Develop a combined nutrition and exercise program designed to reach desired caloric intake, while maintaining appropriate intake of nutrient and fiber, sodium and fats, and appropriate energy expenditure required for the weight goal.;Weight Management: Provide education and appropriate resources to help participant work on and attain dietary goals.;Weight Management/Obesity: Establish reasonable short term and long term weight goals.    Admit Weight  211 lb 12.8 oz (96.1 kg)    Goal Weight: Short Term  205 lb (93 kg)    Goal Weight: Long Term  175 lb (79.4 kg)    Expected Outcomes  Short Term: Continue to assess and modify interventions until short term weight is achieved;Long Term: Adherence to nutrition and physical activity/exercise program aimed toward attainment of established weight goal;Weight Loss: Understanding of general recommendations for a balanced deficit meal plan, which promotes 1-2 lb weight loss per week and includes a negative energy balance of [phone] kcal/d;Understanding recommendations for meals to include 15-35% energy as protein, 25-35% energy from fat, 35-60% energy from carbohydrates, less than 200mg  of dietary cholesterol, 20-35 gm of total fiber daily;Understanding of distribution of calorie intake throughout the day with the consumption of 4-5 meals/snacks    Improve shortness of breath with ADL's  Yes    Intervention  Provide education, individualized exercise plan and daily activity instruction to help decrease symptoms of SOB with activities of daily living.    Expected Outcomes  Short Term: Achieves a reduction of symptoms when performing activities of daily living.    Hypertension  Yes    Intervention  Provide education on lifestyle modifcations including regular physical activity/exercise, weight management,  moderate sodium restriction and increased consumption of fresh fruit, vegetables, and low fat dairy, alcohol moderation, and smoking cessation.;Monitor prescription use compliance.    Expected Outcomes  Short Term: Continued assessment and intervention until BP is < 140/45mm HG in hypertensive participants. < 130/8mm HG in hypertensive participants with diabetes, heart failure or chronic kidney disease.;Long Term: Maintenance of blood pressure at goal levels.    Stress  Yes    Intervention  Offer individual and/or small group education and counseling on adjustment to heart disease, stress management and health-related lifestyle change. Teach and support self-help strategies.;Refer participants experiencing significant psychosocial distress to appropriate mental health specialists for further evaluation and treatment. When possible, include family members and  significant others in education/counseling sessions.    Expected Outcomes  Short Term: Participant demonstrates changes in health-related behavior, relaxation and other stress management skills, ability to obtain effective social support, and compliance with psychotropic medications if prescribed.;Long Term: Emotional wellbeing is indicated by absence of clinically significant psychosocial distress or social isolation.       Core Components/Risk Factors/Patient Goals Review:  Goals and Risk Factor Review    Row Name 08/11/17 1410             Core Components/Risk Factors/Patient Goals Review   Personal Goals Review  Weight Management/Obesity;Hypertension       Review  Sharon Henderson is doing well in rehab. She enjoys coming to class and exercising.  Her weight has not budged, but she would still like to lose more weight.  She has an appointment set up with the dietician for Dec 3 to help with weight loss.  Sharon Henderson's blood pressures have been good in rehab.  She does check them on occasion at home and they have all been within normal limits.         Expected Outcomes  Short: Continue to work on weight loss and go to dietician appointment.  Long: Contiue to work on weight loss and monitoring her blood pressures.           Core Components/Risk Factors/Patient Goals at Discharge (Final Review):  Goals and Risk Factor Review - 08/11/17 1410      Core Components/Risk Factors/Patient Goals Review   Personal Goals Review  Weight Management/Obesity;Hypertension    Review  Sharon Henderson is doing well in rehab. She enjoys coming to class and exercising.  Her weight has not budged, but she would still like to lose more weight.  She has an appointment set up with the dietician for Dec 3 to help with weight loss.  Sharon Henderson's blood pressures have been good in rehab.  She does check them on occasion at home and they have all been within normal limits.     Expected Outcomes  Short: Continue to work on weight loss and go to dietician appointment.  Long: Contiue to work on weight loss and monitoring her blood pressures.        ITP Comments: ITP Comments    Row Name 07/17/17 1358 07/24/17 1157 08/14/17 0904       ITP Comments  Medical Evaluation completed. Chart sent for review and changes to Dr. Bethann Punches Director of [facility]. Diagnosis can be found in [facility] encounter 07/17/17  Spent 15 minutes with patient working on PLB techniques.  30 day review completed. ITP sent to Dr. Bethann Punches Director of [facility]. Continue with ITP unless changes are made by physician.          Comments: 30 day review

## 2017-08-28 ENCOUNTER — Encounter: Payer: Medicare HMO | Attending: Pulmonary Disease

## 2017-08-28 DIAGNOSIS — J449 Chronic obstructive pulmonary disease, unspecified: Secondary | ICD-10-CM | POA: Insufficient documentation

## 2017-08-29 ENCOUNTER — Encounter: Payer: Self-pay | Admitting: *Deleted

## 2017-08-29 ENCOUNTER — Telehealth: Payer: Self-pay | Admitting: *Deleted

## 2017-08-29 DIAGNOSIS — J449 Chronic obstructive pulmonary disease, unspecified: Secondary | ICD-10-CM

## 2017-08-29 NOTE — Telephone Encounter (Signed)
Called to check on status of patient return to program.  She has been out since 11/16.  Unable to leave message and no answer to phone at home.

## 2017-09-07 ENCOUNTER — Telehealth: Payer: Self-pay

## 2017-09-07 NOTE — Telephone Encounter (Signed)
Called to check on status of patient return to program.  She has been out since 11/16.  Unable to leave message and no answer to phone at home.  

## 2017-09-10 DIAGNOSIS — J449 Chronic obstructive pulmonary disease, unspecified: Secondary | ICD-10-CM | POA: Diagnosis not present

## 2017-09-10 DIAGNOSIS — J4540 Moderate persistent asthma, uncomplicated: Secondary | ICD-10-CM | POA: Diagnosis not present

## 2017-09-11 DIAGNOSIS — J449 Chronic obstructive pulmonary disease, unspecified: Secondary | ICD-10-CM

## 2017-09-11 NOTE — Progress Notes (Signed)
Pulmonary Individual Treatment Plan  Patient Details  Name: Sharon Henderson MRN: [id number] Date of Birth: 06/11/1941 Referring Provider:     Pulmonary Rehab from 07/17/2017 in [facility] Cardiac and Pulmonary Rehab  Referring Provider  Jaclynn Major MD      Initial Encounter Date:    Pulmonary Rehab from 07/17/2017 in [facility] Cardiac and Pulmonary Rehab  Date  07/17/17  Referring Provider  Jaclynn Major MD      Visit Diagnosis: Asthma with chronic obstructive pulmonary disease (COPD) (HCC)  Patient's Home Medications on Admission:  Current Outpatient Medications:  .  acetaminophen (TYLENOL) 500 MG tablet, Take 1,000 mg by mouth every 8 (eight) hours as needed., Disp: , Rfl:  .  albuterol (PROVENTIL HFA;VENTOLIN HFA) 108 (90 Base) MCG/ACT inhaler, Inhale 2 puffs into the lungs every 4 (four) hours as needed for wheezing or shortness of breath (cough)., Disp: 1 Inhaler, Rfl: 0 .  aspirin EC 81 MG tablet, Take by mouth., Disp: , Rfl:  .  budesonide-formoterol (SYMBICORT) 160-4.5 MCG/ACT inhaler, INHALE 2 INHALATIONS INTO THE LUNGS 2 (TWO) TIMES DAILY., Disp: 10.2 Inhaler, Rfl: 6 .  cetirizine (ZYRTEC) 10 MG tablet, TAKE 1 TABLET (10 MG TOTAL) BY MOUTH DAILY., Disp: 90 tablet, Rfl: 2 .  fluticasone (FLONASE) 50 MCG/ACT nasal spray, PLACE 1-2 SPRAYS INTO BOTH NOSTRILS DAILY., Disp: 16 g, Rfl: 3 .  furosemide (LASIX) 40 MG tablet, Take 1 tablet twice a day except with extra swelling, then take 2 tabs in AM and 1 in early afternoon.  Take extra tabs no more than 5 days., Disp: 70 tablet, Rfl: 5 .  ibuprofen (ADVIL,MOTRIN) 200 MG tablet, Take by mouth., Disp: , Rfl:  .  ipratropium-albuterol (DUONEB) 0.5-2.5 (3) MG/3ML SOLN, Take 3 mLs by nebulization 4 (four) times daily., Disp: , Rfl: 12 .  losartan (COZAAR) 100 MG tablet, Take 1 tablet (100 mg total) by mouth daily., Disp: 90 tablet, Rfl: 3 .  metFORMIN (GLUCOPHAGE) 500 MG tablet, Take 1 tablet (500 mg total) by mouth daily with breakfast., Disp:  90 tablet, Rfl: 3 .  metoprolol tartrate (LOPRESSOR) 100 MG tablet, Take 1 tablet (100 mg total) by mouth 2 (two) times daily., Disp: 180 tablet, Rfl: 3 .  montelukast (SINGULAIR) 10 MG tablet, TAKE 1 TABLET (10 MG TOTAL) BY MOUTH NIGHTLY. FOR ALLERGIES, Disp: 30 tablet, Rfl: 12 .  simvastatin (ZOCOR) 10 MG tablet, Take 1 tablet (10 mg total) by mouth at bedtime., Disp: 90 tablet, Rfl: 3 .  tiotropium (SPIRIVA) 18 MCG inhalation capsule, Place into inhaler and inhale., Disp: , Rfl:   Past Medical History: Past Medical History:  Diagnosis Date  . Allergic rhinitis   . Asthma   . Diabetes (HCC)    type 2  . Diverticulosis   . Hypertension   . Internal hemorrhoids     Tobacco Use: Social History   Tobacco Use  Smoking Status Never Smoker  Smokeless Tobacco Never Used    Labs: Recent Review Flowsheet Data    Labs for ITP Cardiac and Pulmonary Rehab Latest Ref Rng & Units 10/06/2016 10/11/2016 01/17/2017 04/20/2017 07/27/2017   Cholestrol <200 mg/dL - 409(W) - - -   LDLCALC <100 mg/dL - 119(J) - - -   HDL >47 mg/dL - 61 - - -   Trlycerides <150 mg/dL - 87 - - -   Hemoglobin A1c 5.6 6.9% - 6.9 7.1(A) 6.8(A)       Pulmonary Assessment Scores: Pulmonary Assessment Scores    Row Name 07/17/17  1415         ADL UCSD   ADL Phase  Entry     SOB Score total  89     Rest  3     Walk  5     Stairs  5     Bath  3     Dress  3     Shop  3       CAT Score   CAT Score  31       mMRC Score   mMRC Score  3        Pulmonary Function Assessment: Pulmonary Function Assessment - 07/17/17 1445      Initial Spirometry Results   FVC%  50 %    FEV1%  52 %    FEV1/FVC Ratio  81.24    Comments  best of 2 results, good patient effort      Post Bronchodilator Spirometry Results   FVC%  43 %    FEV1%  42.64 %    FEV1/FVC Ratio  78.02    Comments  best of 2 results, good patient effort      Breath   Bilateral Breath Sounds  Clear slight wheezes in bases    Shortness of Breath   Limiting activity;Fear of Shortness of Breath;No;Panic with Shortness of Breath       Exercise Target Goals:    Exercise Program Goal: Individual exercise prescription set with THRR, safety & activity barriers. Participant demonstrates ability to understand and report RPE using BORG scale, to self-measure pulse accurately, and to acknowledge the importance of the exercise prescription.  Exercise Prescription Goal: Starting with aerobic activity 30 plus minutes a day, 3 days per week for initial exercise prescription. Provide home exercise prescription and guidelines that participant acknowledges understanding prior to discharge.  Activity Barriers & Risk Stratification: Activity Barriers & Cardiac Risk Stratification - 07/17/17 1536      Activity Barriers & Cardiac Risk Stratification   Activity Barriers  Shortness of Breath;Muscular Weakness;Balance Concerns;Other (comment)    Comments  Left Knee stiffness - residual to a past accident        6 Minute Walk: 6 Minute Walk    Row Name 07/17/17 1532         6 Minute Walk   Phase  Initial     Distance  433 feet     Walk Time  4.91 minutes     # of Rest Breaks  1 test was stopped at 4:55      MPH  1     METS  0.59     RPE  11     Perceived Dyspnea   2     VO2 Peak  2.08     Symptoms  Yes (comment)     Comments  Shortness of breath      Resting HR  57 bpm     Resting BP  128/72     Resting Oxygen Saturation   98 %     Exercise Oxygen Saturation  during 6 min walk  95 %     Max Ex. HR  113 bpm     Max Ex. BP  124/70     2 Minute Post BP  116/64       Interval HR   1 Minute HR  94     2 Minute HR  89     3 Minute HR  103     4 Minute HR  105     5 Minute HR  113 test stopped at 4:55     6 Minute HR  82     2 Minute Post HR  66     Interval Heart Rate?  Yes       Interval Oxygen   Interval Oxygen?  Yes     Baseline Oxygen Saturation %  98 %     1 Minute Oxygen Saturation %  100 %     1 Minute Liters of Oxygen  0  L Room Air     2 Minute Oxygen Saturation %  98 %     2 Minute Liters of Oxygen  0 L     3 Minute Oxygen Saturation %  95 %     3 Minute Liters of Oxygen  0 L     4 Minute Oxygen Saturation %  95 %     4 Minute Liters of Oxygen  0 L     5 Minute Oxygen Saturation %  95 %     5 Minute Liters of Oxygen  0 L     6 Minute Oxygen Saturation %  96 %     6 Minute Liters of Oxygen  0 L     2 Minute Post Oxygen Saturation %  100 %     2 Minute Post Liters of Oxygen  0 L       Oxygen Initial Assessment: Oxygen Initial Assessment - 07/17/17 1400      Home Oxygen   Home Oxygen Device  None    Sleep Oxygen Prescription  None    Home Exercise Oxygen Prescription  None    Home at Rest Exercise Oxygen Prescription  None    Compliance with Home Oxygen Use  -- does not have home oxygen      Initial 6 min Walk   Oxygen Used  None      Program Oxygen Prescription   Program Oxygen Prescription  None      Intervention   Short Term Goals  To learn and understand importance of maintaining oxygen saturations>88%;To learn and demonstrate proper use of respiratory medications;To learn and demonstrate proper pursed lip breathing techniques or other breathing techniques.;To learn and understand importance of monitoring SPO2 with pulse oximeter and demonstrate accurate use of the pulse oximeter.    Long  Term Goals  Verbalizes importance of monitoring SPO2 with pulse oximeter and return demonstration;Exhibits proper breathing techniques, such as pursed lip breathing or other method taught during program session;Demonstrates proper use of MDI's;Maintenance of O2 saturations>88%;Compliance with respiratory medication       Oxygen Re-Evaluation: Oxygen Re-Evaluation    Row Name 07/24/17 1151             Program Oxygen Prescription   Program Oxygen Prescription  None         Home Oxygen   Home Oxygen Device  None       Sleep Oxygen Prescription  None       Home Exercise Oxygen Prescription  None        Home at Rest Exercise Oxygen Prescription  None         Goals/Expected Outcomes   Short Term Goals  To learn and understand importance of maintaining oxygen saturations>88%;To learn and demonstrate proper use of respiratory medications;To learn and demonstrate proper pursed lip breathing techniques or other breathing techniques.;To learn and understand importance of monitoring SPO2 with pulse oximeter and demonstrate accurate use of the  pulse oximeter.       Long  Term Goals  Verbalizes importance of monitoring SPO2 with pulse oximeter and return demonstration;Exhibits proper breathing techniques, such as pursed lip breathing or other method taught during program session;Compliance with respiratory medication;Maintenance of O2 saturations>88%;Demonstrates proper use of MDI's       Comments  Reviewed PLB technique with pt.  Talked about how it work and it's important to maintaining his exercise saturations.         Goals/Expected Outcomes  Short: Become more profiecient at using PLB.   Long: Become independent at using PLB.          Oxygen Discharge (Final Oxygen Re-Evaluation): Oxygen Re-Evaluation - 07/24/17 1151      Program Oxygen Prescription   Program Oxygen Prescription  None      Home Oxygen   Home Oxygen Device  None    Sleep Oxygen Prescription  None    Home Exercise Oxygen Prescription  None    Home at Rest Exercise Oxygen Prescription  None      Goals/Expected Outcomes   Short Term Goals  To learn and understand importance of maintaining oxygen saturations>88%;To learn and demonstrate proper use of respiratory medications;To learn and demonstrate proper pursed lip breathing techniques or other breathing techniques.;To learn and understand importance of monitoring SPO2 with pulse oximeter and demonstrate accurate use of the pulse oximeter.    Long  Term Goals  Verbalizes importance of monitoring SPO2 with pulse oximeter and return demonstration;Exhibits proper breathing  techniques, such as pursed lip breathing or other method taught during program session;Compliance with respiratory medication;Maintenance of O2 saturations>88%;Demonstrates proper use of MDI's    Comments  Reviewed PLB technique with pt.  Talked about how it work and it's important to maintaining his exercise saturations.      Goals/Expected Outcomes  Short: Become more profiecient at using PLB.   Long: Become independent at using PLB.       Initial Exercise Prescription: Initial Exercise Prescription - 07/17/17 1500      Date of Initial Exercise RX and Referring Provider   Date  07/17/17    Referring Provider  Jaclynn Major MD      Treadmill   MPH  1    Grade  0    Minutes  15 5 intervals of 3 minutes     METs  1.77      NuStep   Level  1    SPM  80    Minutes  15    METs  3      Arm Ergometer   Level  1    Watts  5    RPM  25    Minutes  15    METs  3      Prescription Details   Frequency (times per week)  2    Duration  Progress to 45 minutes of aerobic exercise without signs/symptoms of physical distress      Intensity   THRR 40-80% of Max Heartrate  91-125    Ratings of Perceived Exertion  11-13    Perceived Dyspnea  0-4      Progression   Progression  Continue to progress workloads to maintain intensity without signs/symptoms of physical distress.      Resistance Training   Training Prescription  Yes    Weight  2    Reps  10-15       Perform Capillary Blood Glucose checks as needed.  Exercise Prescription Changes: Exercise  Prescription Changes    Row Name 07/17/17 1500 08/01/17 1500 08/11/17 1400 08/15/17 1300       Response to Exercise   Blood Pressure (Admit)  128/72  134/72  -  136/74    Blood Pressure (Exercise)  124/70  -  -  -    Blood Pressure (Exit)  116/64  120/58  -  122/64    Heart Rate (Admit)  57 bpm  66 bpm  -  73 bpm    Heart Rate (Exercise)  113 bpm  79 bpm  -  65 bpm    Heart Rate (Exit)  66 bpm  75 bpm  -  60 bpm    Oxygen  Saturation (Admit)  98 %  99 %  -  97 %    Oxygen Saturation (Exercise)  95 %  99 %  -  97 %    Oxygen Saturation (Exit)  100 %  100 %  -  97 %    Rating of Perceived Exertion (Exercise)  11  13  -  13    Perceived Dyspnea (Exercise)  2  3  -  1    Symptoms  SOB   SOB  -  SOB    Comments  walk test completed  -  -  -    Duration  -  Continue with 45 min of aerobic exercise without signs/symptoms of physical distress.  -  Continue with 45 min of aerobic exercise without signs/symptoms of physical distress.    Intensity  -  THRR unchanged  -  THRR unchanged      Progression   Progression  -  Continue to progress workloads to maintain intensity without signs/symptoms of physical distress.  -  Continue to progress workloads to maintain intensity without signs/symptoms of physical distress.    Average METs  -  1.97  -  1.95      Resistance Training   Training Prescription  -  Yes  -  Yes    Weight  -  2 lbs  -  2 lbs    Reps  -  10-15  -  10-15      Interval Training   Interval Training  -  No  -  -      NuStep   Level  -  1  -  1    SPM  -  47  -  47    Minutes  -  15  -  15    METs  -  2.1  -  2.3      Arm Ergometer   Level  -  1  -  1    RPM  -  42  -  -    Minutes  -  15  -  15    METs  -  2.2  -  -      Track   Laps  -  13  -  12    Minutes  -  15  -  15    METs  -  1.6  -  1.6      Home Exercise Plan   Plans to continue exercise at  -  -  [facility]    Frequency  -  -  Add 1 additional day to program exercise sessions.  Add 1 additional day to program exercise sessions.    Initial Home Exercises  Provided  -  -  08/11/17  08/11/17       Exercise Comments: Exercise Comments    Row Name 07/24/17 1150           Exercise Comments   First full day of exercise!  Patient was oriented to gym and equipment including functions, settings, policies, and procedures.  Patient's individual exercise prescription  and treatment plan were reviewed.  All starting workloads were established based on the results of the 6 minute walk test done at initial orientation visit.  The plan for exercise progression was also introduced and progression will be customized based on patient's performance and goals.          Exercise Goals and Review: Exercise Goals    Row Name 07/17/17 1544             Exercise Goals   Increase Physical Activity  Yes       Intervention  Provide advice, education, support and counseling about physical activity/exercise needs.;Develop an individualized exercise prescription for aerobic and resistive training based on initial evaluation findings, risk stratification, comorbidities and participant's personal goals.       Expected Outcomes  Achievement of increased cardiorespiratory fitness and enhanced flexibility, muscular endurance and strength shown through measurements of functional capacity and personal statement of participant.       Increase Strength and Stamina  Yes       Intervention  Provide advice, education, support and counseling about physical activity/exercise needs.;Develop an individualized exercise prescription for aerobic and resistive training based on initial evaluation findings, risk stratification, comorbidities and participant's personal goals.       Expected Outcomes  Achievement of increased cardiorespiratory fitness and enhanced flexibility, muscular endurance and strength shown through measurements of functional capacity and personal statement of participant.       Able to understand and use rate of perceived exertion (RPE) scale  Yes       Intervention  Provide education and explanation on how to use RPE scale       Expected Outcomes  Short Term: Able to use RPE daily in rehab to express subjective intensity level;Long Term:  Able to use RPE to guide intensity level when exercising independently       Able to understand and use Dyspnea scale  Yes        Intervention  Provide education and explanation on how to use Dyspnea scale       Expected Outcomes  Short Term: Able to use Dyspnea scale daily in rehab to express subjective sense of shortness of breath during exertion;Long Term: Able to use Dyspnea scale to guide intensity level when exercising independently       Knowledge and understanding of Target Heart Rate Range (THRR)  Yes       Intervention  Provide education and explanation of THRR including how the numbers were predicted and where they are located for reference       Expected Outcomes  Short Term: Able to state/look up THRR;Long Term: Able to use THRR to govern intensity when exercising independently;Short Term: Able to use daily as guideline for intensity in rehab       Able to check pulse independently  Yes       Intervention  Provide education and demonstration on how to check pulse in carotid and radial arteries.;Review the importance of being able to check your own pulse for safety during independent exercise       Expected Outcomes  Short Term:  Able to explain why pulse checking is important during independent exercise;Long Term: Able to check pulse independently and accurately       Understanding of Exercise Prescription  Yes       Intervention  Provide education, explanation, and written materials on patient's individual exercise prescription       Expected Outcomes  Short Term: Able to explain program exercise prescription;Long Term: Able to explain home exercise prescription to exercise independently          Exercise Goals Re-Evaluation : Exercise Goals Re-Evaluation    Row Name 07/24/17 1150 08/01/17 1553 08/11/17 1408 08/15/17 1349 08/29/17 1527     Exercise Goal Re-Evaluation   Exercise Goals Review  Understanding of Exercise Prescription;Knowledge and understanding of Target Heart Rate Range (THRR);Able to understand and use Dyspnea scale;Able to understand and use rate of perceived exertion (RPE) scale  Increase  Physical Activity;Increase Strength and Stamina;Understanding of Exercise Prescription  Increase Physical Activity;Increase Strength and Stamina;Understanding of Exercise Prescription;Knowledge and understanding of Target Heart Rate Range (THRR);Able to understand and use Dyspnea scale;Able to check pulse independently;Able to understand and use rate of perceived exertion (RPE) scale  Increase Physical Activity;Increase Strength and Stamina;Understanding of Exercise Prescription  -   Comments  Reviewed RPE scale, THR and program prescription with pt today.  Pt voiced understanding and was given a copy of goals to take home.   Sharon Henderson is off to a good start in rehab.  She has completed three full days of exercise.  She is already getting in 13 laps on the track.  We will continue to monitor her progression.   Reviewed home exercise with pt today.  [facility]  She is going to start with one extra day a week at home for a total of three days a week.  We also about coming in to 10am [facility] class on Wednesdays as she has a [employer] commitment.  Reviewed THR, pulse, RPE, sign and symptoms, NTG use, and when to call 911 or MD.  Also discussed weather considerations and indoor options.  Pt voiced understanding. She has also mentioned that her knees have been hurting after exercise.  We talked about icing her knees down after exercise.  Sharon Henderson [facility] only [facility]  She will need to attend more to really work on workload increases or do more at home.  We will continue to monitor her progression.  Out since last review   Expected Outcomes  Short: Use RPE daily to regulate intensity.  Long: Follow program prescription in THR.  Short: Start to increase workloads and review home exercise guidelines.  Long: Continue to attend rehab regularly.   Short: Add in at least one extra day at home for three days a week. Ice knees after exercise.   Long:  Try to exercise more at home consistently  Short: Ice knees after knees.  Long: Exercise more consistently.  -      Discharge Exercise Prescription (Final Exercise Prescription Changes): Exercise Prescription Changes - 08/15/17 1300      Response to Exercise   Blood Pressure (Admit)  136/74    Blood Pressure (Exit)  122/64    Heart Rate (Admit)  73 bpm    Heart Rate (Exercise)  65 bpm    Heart Rate (Exit)  60 bpm    Oxygen Saturation (Admit)  97 %    Oxygen Saturation (Exercise)  97 %  Oxygen Saturation (Exit)  97 %    Rating of Perceived Exertion (Exercise)  13    Perceived Dyspnea (Exercise)  1    Symptoms  SOB    Duration  Continue with 45 min of aerobic exercise without signs/symptoms of physical distress.    Intensity  THRR unchanged      Progression   Progression  Continue to progress workloads to maintain intensity without signs/symptoms of physical distress.    Average METs  1.95      Resistance Training   Training Prescription  Yes    Weight  2 lbs    Reps  10-15      NuStep   Level  1    SPM  47    Minutes  15    METs  2.3      Arm Ergometer   Level  1    Minutes  15      Track   Laps  12    Minutes  15    METs  1.6      Home Exercise Plan   Plans to continue exercise at  [facility]    Frequency  Add 1 additional day to program exercise sessions.    Initial Home Exercises Provided  08/11/17       Nutrition:  Target Goals: Understanding of nutrition guidelines, daily intake of sodium 1500mg , cholesterol 200mg , calories 30% from fat and 7% or less from saturated fats, daily to have 5 or more servings of fruits and vegetables.  Biometrics: Pre Biometrics - 07/17/17 1545      Pre Biometrics   Height  5' 4.7" (1.643 m)    Weight  211 lb 8 oz (95.9 kg)    Waist Circumference  43 inches    Hip Circumference  49.7 inches    Waist to Hip Ratio  0.87 %    BMI (Calculated)  35.54        Nutrition Therapy Plan and  Nutrition Goals: Nutrition Therapy & Goals - 08/28/17 1353      Nutrition Therapy   Diet  Patient did not show for nutrition appointment this morning (08/28/17)       Nutrition Discharge: Rate Your Plate Scores: Nutrition Assessments - 07/17/17 1414      MEDFICTS Scores   Pre Score  33       Nutrition Goals Re-Evaluation: Nutrition Goals Re-Evaluation    Row Name 08/11/17 1417             Goals   Nutrition Goal  Weight Loss, Diet to help with weight loss       Comment  Sharon Henderson has an appointment scheduled for Dec 3.  She is already watching her sugar and salt intake; however, she would like to get a boost on her weight loss.       Expected Outcome  Short: Meet with dietician  Long: Follow goal and guidelines laid out by dietician for weight loss          Nutrition Goals Discharge (Final Nutrition Goals Re-Evaluation): Nutrition Goals Re-Evaluation - 08/11/17 1417      Goals   Nutrition Goal  Weight Loss, Diet to help with weight loss    Comment  Sharon Henderson has an appointment scheduled for Dec 3.  She is already watching her sugar and salt intake; however, she would like to get a boost on her weight loss.    Expected Outcome  Short: Meet with dietician  Long:  Follow goal and guidelines laid out by dietician for weight loss       Psychosocial: Target Goals: Acknowledge presence or absence of significant depression and/or stress, maximize coping skills, provide positive support system. Participant is able to verbalize types and ability to use techniques and skills needed for reducing stress and depression.   Initial Review & Psychosocial Screening: Initial Psych Review & Screening - 07/17/17 1410      Initial Review   Current issues with  Current Stress Concerns    Source of Stress Concerns  Chronic Illness    Comments  Her ADL's are limited with her disease      Family Dynamics   Good Support System?  Yes    Comments  Her four daughters are a great support system and  three sisters.      Barriers   Psychosocial barriers to participate in program  There are no identifiable barriers or psychosocial needs.      Screening Interventions   Interventions  Yes;Encouraged to exercise;Provide feedback about the scores to participant;Program counselor consult    Expected Outcomes  Short Term goal: Utilizing psychosocial counselor, staff and physician to assist with identification of specific Stressors or current issues interfering with healing process. Setting desired goal for each stressor or current issue identified.;Long Term Goal: Stressors or current issues are controlled or eliminated.;Short Term goal: Identification and review with participant of any Quality of Life or Depression concerns found by scoring the questionnaire.;Long Term goal: The participant improves quality of Life and PHQ9 Scores as seen by post scores and/or verbalization of changes       Quality of Life Scores:   PHQ-9: Recent Review Flowsheet Data    Depression screen [facility] 2/9 07/17/2017 04/04/2017 04/18/2016 01/14/2016 08/14/2015   Decreased Interest 0 2 0 0 -   Down, Depressed, Hopeless 0 3 0 0 3   PHQ - 2 Score 0 5 0 0 3   Altered sleeping 0 0 - - 2   Tired, decreased energy 1 0 - - 1   Change in appetite 0 0 - - 1   Feeling bad or failure about yourself  1 1 - - 0   Trouble concentrating 1 1 - - 1   Moving slowly or fidgety/restless 0 0 - - 0   Suicidal thoughts 0 0 - - 0   PHQ-9 Score 3 7 - - 8   Difficult doing work/chores Not difficult at all Not difficult at all - - -     Interpretation of Total Score  Total Score Depression Severity:  1-4 = Minimal depression, 5-9 = Mild depression, 10-14 = Moderate depression, 15-19 = Moderately severe depression, 20-27 = Severe depression   Psychosocial Evaluation and Intervention:   Psychosocial Re-Evaluation: Psychosocial Re-Evaluation    Row Name 08/11/17 1419             Psychosocial Re-Evaluation   Current issues with   Current Stress Concerns       Comments  Sharon Henderson has been doing well in rehab.  She enjoys coming to rehab.  Her favorite part has been attending the education classes.  She only comes on Mondays and Fridays as she has a church commitment on Wednesdays.  We talked about trying to come to the 10am class on Wednesdays and she is going to think about that option.  She can already tell that she is breatihing better.  She denies depression or anxiety sysmptoms.  She sleeps well and does not  have any financial issues.   She has also mentioned that her knees have been hurting after exercise.  We talked about icing her knees down after exercise.       Expected Outcomes  Short: Continue to attend rehab and decide about trying to come on Wednesdays.  Long: Continue to attend rehab and stay positive.       Interventions  Encouraged to attend Cardiac Rehabilitation for the exercise;Stress management education       Continue Psychosocial Services   Follow up required by staff          Psychosocial Discharge (Final Psychosocial Re-Evaluation): Psychosocial Re-Evaluation - 08/11/17 1419      Psychosocial Re-Evaluation   Current issues with  Current Stress Concerns    Comments  Sharon Henderson has been doing well in rehab.  She enjoys coming to rehab.  Her favorite part has been attending the education classes.  She only comes on Mondays and Fridays as she has a church commitment on Wednesdays.  We talked about trying to come to the 10am class on Wednesdays and she is going to think about that option.  She can already tell that she is breatihing better.  She denies depression or anxiety sysmptoms.  She sleeps well and does not have any financial issues.   She has also mentioned that her knees have been hurting after exercise.  We talked about icing her knees down after exercise.    Expected Outcomes  Short: Continue to attend rehab and decide about trying to come on Wednesdays.  Long: Continue to attend rehab and stay  positive.    Interventions  Encouraged to attend Cardiac Rehabilitation for the exercise;Stress management education    Continue Psychosocial Services   Follow up required by staff       Education: Education Goals: Education classes will be provided on a weekly basis, covering required topics. Participant will state understanding/return demonstration of topics presented.  Learning Barriers/Preferences: Learning Barriers/Preferences - 07/17/17 1449      Learning Barriers/Preferences   Learning Barriers  Sight wears glasses    Learning Preferences  None       Education Topics: Initial Evaluation Education: - Verbal, written and demonstration of respiratory meds, RPE/PD scales, oximetry and breathing techniques. Instruction on use of nebulizers and MDIs: cleaning and proper use, rinsing mouth with steroid doses and importance of monitoring MDI activations.   Pulmonary Rehab from 08/11/2017 in [facility] Cardiac and Pulmonary Rehab  Date  07/17/17  Educator  [facility]  Instruction Review Code  1- Verbalizes Understanding      General Nutrition Guidelines/Fats and Fiber: -Group instruction provided by verbal, written material, models and posters to present the general guidelines for heart healthy nutrition. Gives an explanation and review of dietary fats and fiber.   Controlling Sodium/Reading Food Labels: -Group verbal and written material supporting the discussion of sodium use in heart healthy nutrition. Review and explanation with models, verbal and written materials for utilization of the food label.   Exercise Physiology & Risk Factors: - Group verbal and written instruction with models to review the exercise physiology of the cardiovascular system and associated critical values. Details cardiovascular disease risk factors and the goals associated with each risk factor.   Aerobic Exercise & Resistance Training: - Gives group verbal and written discussion on the health impact of  inactivity. On the components of aerobic and resistive training programs and the benefits of this training and how to safely progress through these programs.   Pulmonary  Rehab from 08/11/2017 in [facility] Cardiac and Pulmonary Rehab  Date  07/28/17  Educator  [facility]  Instruction Review Code  1- Verbalizes Understanding      Flexibility, Balance, General Exercise Guidelines: - Provides group verbal and written instruction on the benefits of flexibility and balance training programs. Provides general exercise guidelines with specific guidelines to those with heart or lung disease. Demonstration and skill practice provided.   Stress Management: - Provides group verbal and written instruction about the health risks of elevated stress, cause of high stress, and healthy ways to reduce stress.   Depression: - Provides group verbal and written instruction on the correlation between heart/lung disease and depressed mood, treatment options, and the stigmas associated with seeking treatment.   Exercise & Equipment Safety: - Individual verbal instruction and demonstration of equipment use and safety with use of the equipment.   Pulmonary Rehab from 08/11/2017 in [facility] Cardiac and Pulmonary Rehab  Date  07/17/17  Educator  [facility]  Instruction Review Code  1- Verbalizes Understanding      Infection Prevention: - Provides verbal and written material to individual with discussion of infection control including proper hand washing and proper equipment cleaning during exercise session.   Pulmonary Rehab from 08/11/2017 in [facility] Cardiac and Pulmonary Rehab  Date  07/17/17  Educator  [facility]  Instruction Review Code  1- Verbalizes Understanding      Falls Prevention: - Provides verbal and written material to individual with discussion of falls prevention and safety.   Pulmonary Rehab from 08/11/2017 in [facility] Cardiac and Pulmonary Rehab  Date  07/17/17  Educator  [facility]  Instruction Review Code  1- Verbalizes  Understanding      Diabetes: - Individual verbal and written instruction to review signs/symptoms of diabetes, desired ranges of glucose level fasting, after meals and with exercise. Advice that pre and post exercise glucose checks will be done for 3 sessions at entry of program.   Chronic Lung Diseases: - Group verbal and written instruction to review new updates, new respiratory medications, new advancements in procedures and treatments. Provide informative websites and "800" numbers of self-education.   Lung Procedures: - Group verbal and written instruction to describe testing methods done to diagnose lung disease. Review the outcome of test results. Describe the treatment choices: Pulmonary Function Tests, ABGs and oximetry.   Energy Conservation: - Provide group verbal and written instruction for methods to conserve energy, plan and organize activities. Instruct on pacing techniques, use of adaptive equipment and posture/positioning to relieve shortness of breath.   Triggers: - Group verbal and written instruction to review types of environmental controls: home humidity, furnaces, filters, dust mite/pet prevention, HEPA vacuums. To discuss weather changes, air quality and the benefits of nasal washing.   Exacerbations: - Group verbal and written instruction to provide: warning signs, infection symptoms, calling MD promptly, preventive modes, and value of vaccinations. Review: effective airway clearance, coughing and/or vibration techniques. Create an Sport and exercise psychologist.   Oxygen: - Individual and group verbal and written instruction on oxygen therapy. Includes supplement oxygen, available portable oxygen systems, continuous and intermittent flow rates, oxygen safety, concentrators, and Medicare reimbursement for oxygen.   Pulmonary Rehab from 08/11/2017 in [facility] Cardiac and Pulmonary Rehab  Date  07/17/17  Educator  [facility]  Instruction Review Code  1- Verbalizes Understanding       Respiratory Medications: - Group verbal and written instruction to review medications for lung disease. Drug class, frequency, complications, importance of spacers, rinsing mouth after steroid MDI's,  and proper cleaning methods for nebulizers.   [facility]  Date  07/17/17  [facility]  Instruction Review Code  1- Verbalizes Understanding      AED/CPR: - Group verbal and written instruction with the use of models to demonstrate the basic use of the AED with the basic ABC's of resuscitation.   Breathing Retraining: - Provides individuals verbal and written instruction on purpose, frequency, and proper technique of diaphragmatic breathing and pursed-lipped breathing. Applies individual practice skills.   [facility]ulmonary Rehab [facility]11[facility]2017 [facility] Cardiac and Pulmonary Rehab  Date  07/17/17  [facility]  Instruction Review Code  1- Verbalizes Understanding      Anatomy and Physiology of the Lungs: - Group verbal and written instruction with the use of models to provide basic lung anatomy and physiology related to function, structure and complications of lung disease.   Anatomy & Physiology of the Heart: - Group verbal and written instruction and models provide basic cardiac anatomy and physiology, with the coronary electrical and arterial systems. Review of: AMI, Angina, Valve disease, Heart Failure, Cardiac Arrhythmia, Pacemakers, and the ICD.   [facility] Re[facility]  Date  08/11/17  Educator  [facility]  Instruction Review Code  1- Verbalizes Understanding      Heart Failure: - Group verbal and written instruction on the basics of heart failure: signs/symptoms, treatments, explanation of ejection fraction, enlarged heart and cardiomyopathy.   Pulmonary Rehab [facility]11/2017 [facility] Cardiac and Pulmonary Rehab  Date  08/11/17  Educator  [facility]  Instruction Review Code  1-  Verbalizes Understanding      Sleep Apnea: - Individual verbal and written instruction to review Obstructive Sleep Apnea. Review of risk factors, methods for diagnosing and types of masks and machines for OSA.   Anxiety: - Provides group, verbal and written instruction on the correlation between heart/lung disease and anxiety, treatment options, and management of anxiety.   Relaxation: - Provides group, verbal and written instruction about the benefits of relaxation for patients with heart/lung disease. Also provides patients with examples of relaxation techniques.   Cardiac Medications: - Group verbal and written instruction to review commonly prescribed medications for heart disease. Reviews the medication, class of the drug, and side effects.   Know Your Numbers: -Group verbal and written instruction about important numbers in your health.  Review of Cholesterol, Blood Pressure, Diabetes, and BMI and the role they play in your overall health.   Other: -Provides group and verbal instruction on various topics (see comments)    Knowledge Questionnaire Score: Knowledge Questionnaire Score - 07/17/17 1416      Knowledge Questionnaire Score   Pre Score  7/10        Core Components/Risk Factors/Patient Goals at Admission: Personal Goals and Risk Factors at Admission - 07/17/17 1431      Core Components/Risk Factors/Patient Goals on Admission    Weight Management  Yes;Weight Loss    Intervention  Weight Management: Develop a combined nutrition and exercise program designed to reach desired caloric intake, while maintaining appropriate intake of nutrient and fiber, sodium and fats, and appropriate energy expenditure required for the weight goal.;Weight Management: Provide education and appropriate resources to help participant work on and attain dietary goals.;Weight Management/Obesity: Establish reasonable short term and long term weight goals.    Admit Weight  211 lb 12.8 oz  (96.1 kg)    Goal Weight: Short Term  205 lb (93 kg)    Goal Weight: Long Term  175 lb (79.4 kg)    Expected Outcomes  Short Term: Continue to assess and modify interventions until short term weight is achieved;Long Term: Adherence to nutrition and physical activity/exercise program aimed toward attainment of established weight goal;Weight Loss: Understanding of general recommendations for a balanced deficit meal plan, which promotes 1-2 lb weight loss per week and includes a negative energy balance of [phone] kcal/d;Understanding recommendations for meals to include 15-35% energy as protein, 25-35% energy from fat, 35-60% energy from carbohydrates, less than 200mg  of dietary cholesterol, 20-35 gm of total fiber daily;Understanding of distribution of calorie intake throughout the day with the consumption of 4-5 meals/snacks    Improve shortness of breath with ADL's  Yes    Intervention  Provide education, individualized exercise plan and daily activity instruction to help decrease symptoms of SOB with activities of daily living.    Expected Outcomes  Short Term: Achieves a reduction of symptoms when performing activities of daily living.    Hypertension  Yes    Intervention  Provide education on lifestyle modifcations including regular physical activity/exercise, weight management, moderate sodium restriction and increased consumption of fresh fruit, vegetables, and low fat dairy, alcohol moderation, and smoking cessation.;Monitor prescription use compliance.    Expected Outcomes  Short Term: Continued assessment and intervention until BP is < 140/108mm HG in hypertensive participants. < 130/53mm HG in hypertensive participants with diabetes, heart failure or chronic kidney disease.;Long Term: Maintenance of blood pressure at goal levels.    Stress  Yes    Intervention  Offer individual and/or small group education and counseling on adjustment to heart disease, stress management and health-related  lifestyle change. Teach and support self-help strategies.;Refer participants experiencing significant psychosocial distress to appropriate mental health specialists for further evaluation and treatment. When possible, include family members and significant others in education/counseling sessions.    Expected Outcomes  Short Term: Participant demonstrates changes in health-related behavior, relaxation and other stress management skills, ability to obtain effective social support, and compliance with psychotropic medications if prescribed.;Long Term: Emotional wellbeing is indicated by absence of clinically significant psychosocial distress or social isolation.       Core Components/Risk Factors/Patient Goals Review:  Goals and Risk Factor Review    Row Name 08/11/17 1410             Core Components/Risk Factors/Patient Goals Review   Personal Goals Review  Weight Management/Obesity;Hypertension       Review  Sharon Henderson is doing well in rehab. She enjoys coming to class and exercising.  Her weight has not budged, but she would still like to lose more weight.  She has an appointment set up with the dietician for Dec 3 to help with weight loss.  Sharon Henderson's blood pressures have been good in rehab.  She does check them on occasion at home and they have all been within normal limits.        Expected Outcomes  Short: Continue to work on weight loss and go to dietician appointment.  Long: Contiue to work on weight loss and monitoring her blood pressures.           Core Components/Risk Factors/Patient Goals at Discharge (Final Review):  Goals and Risk Factor Review - 08/11/17 1410      Core Components/Risk Factors/Patient Goals Review   Personal Goals Review  Weight Management/Obesity;Hypertension    Review  Sharon Henderson is doing well in rehab. She enjoys coming to class and  exercising.  Her weight has not budged, but she would still like to lose more weight.  She has an appointment set up with the dietician  for Dec 3 to help with weight loss.  Sharon Henderson's blood pressures have been good in rehab.  She does check them on occasion at home and they have all been within normal limits.     Expected Outcomes  Short: Continue to work on weight loss and go to dietician appointment.  Long: Contiue to work on weight loss and monitoring her blood pressures.        ITP Comments: ITP Comments    Row Name 07/17/17 1358 07/24/17 1157 08/14/17 0904 08/29/17 1527 09/11/17 0841   ITP Comments  Medical Evaluation completed. Chart sent for review and changes to Dr. Bethann Punches Director of [facility]. Diagnosis can be found in [facility] encounter 07/17/17  Spent 15 minutes with patient working on PLB techniques.  30 day review completed. ITP sent to Dr. Bethann Punches Director of [facility]. Continue with ITP unless changes are made by physician.    Called to check on status of patient return to program.  She has been out since 11/16.  Unable to leave message and no answer to phone at home.   30 day review completed. ITP sent to Dr. Bethann Punches Director of [facility]. Continue with ITP unless changes are made by physician.        Comments: 30 day review

## 2017-09-15 ENCOUNTER — Encounter: Payer: Self-pay | Admitting: *Deleted

## 2017-09-15 DIAGNOSIS — J449 Chronic obstructive pulmonary disease, unspecified: Secondary | ICD-10-CM

## 2017-09-15 NOTE — Progress Notes (Signed)
Discharge Progress Report  Patient Details  Name: Sharon Henderson MRN: [id number] Date of Birth: Nov 12, 1940 Referring Provider:     Pulmonary Rehab from 07/17/2017 in [facility]  Referring Provider  Jaclynn Major MD       Number of Visits: 8/36  Reason for Discharge:  Early Exit:  Lack of attendance  Smoking History:  Social History   Tobacco Use  Smoking Status Never Smoker  Smokeless Tobacco Never Used    Diagnosis:  Asthma with chronic obstructive pulmonary disease (COPD) (HCC)  ADL [facility]: Pulmonary Assessment Scores    Row Name 07/17/17 1415         ADL [facility]   ADL Phase  Entry     SOB Score total  89     Rest  3     Walk  5     Stairs  5     Bath  3     Dress  3     Shop  3       CAT Score   CAT Score  31       mMRC Score   mMRC Score  3        Initial Exercise Prescription: Initial Exercise Prescription - 07/17/17 1500      Date of Initial Exercise RX and Referring Provider   Date  07/17/17    Referring Provider  Jaclynn Major MD      Treadmill   MPH  1    Grade  0    Minutes  15 5 intervals of 3 minutes     METs  1.77      NuStep   Level  1    SPM  80    Minutes  15    METs  3      Arm Ergometer   Level  1    Watts  5    RPM  25    Minutes  15    METs  3      Prescription Details   Frequency (times per week)  2    Duration  Progress to 45 minutes of aerobic exercise without signs/symptoms of physical distress      Intensity   THRR 40-80% of Max Heartrate  91-125    Ratings of Perceived Exertion  11-13    Perceived Dyspnea  0-4      Progression   Progression  Continue to progress workloads to maintain intensity without signs/symptoms of physical distress.      Resistance Training   Training Prescription  Yes    Weight  2    Reps  10-15       Discharge Exercise Prescription (Final Exercise Prescription Changes): Exercise Prescription Changes - 08/15/17 1300      Response to Exercise   Blood  Pressure (Admit)  136/74    Blood Pressure (Exit)  122/64    Heart Rate (Admit)  73 bpm    Heart Rate (Exercise)  65 bpm    Heart Rate (Exit)  60 bpm    Oxygen Saturation (Admit)  97 %    Oxygen Saturation (Exercise)  97 %    Oxygen Saturation (Exit)  97 %    Rating of Perceived Exertion (Exercise)  13    Perceived Dyspnea (Exercise)  1    Symptoms  SOB    Duration  Continue with 45 min of aerobic exercise without signs/symptoms of physical distress.    Intensity  THRR unchanged  Progression   Progression  Continue to progress workloads to maintain intensity without signs/symptoms of physical distress.    Average METs  1.95      Resistance Training   Training Prescription  Yes    Weight  2 lbs    Reps  10-15      NuStep   Level  1    SPM  47    Minutes  15    METs  2.3      Arm Ergometer   Level  1    Minutes  15      Track   Laps  12    Minutes  15    METs  1.6      Home Exercise Plan   Plans to continue exercise at  Home (comment) [facility]    Frequency  Add 1 additional day to program exercise sessions.    Initial Home Exercises Provided  08/11/17       Functional Capacity: 6 Minute Walk    Row Name 07/17/17 1532         6 Minute Walk   Phase  Initial     Distance  433 feet     Walk Time  4.91 minutes     # of Rest Breaks  1 test was stopped at 4:55      MPH  1     METS  0.59     RPE  11     Perceived Dyspnea   2     VO2 Peak  2.08     Symptoms  Yes (comment)     Comments  Shortness of breath      Resting HR  57 bpm     Resting BP  128/72     Resting Oxygen Saturation   98 %     Exercise Oxygen Saturation  during 6 min walk  95 %     Max Ex. HR  113 bpm     Max Ex. BP  124/70     2 Minute Post BP  116/64       Interval HR   1 Minute HR  94     2 Minute HR  89     3 Minute HR  103     4 Minute HR  105     5 Minute HR  113 test stopped at 4:55     6 Minute HR  82     2 Minute Post HR  66     Interval Heart Rate?  Yes        Interval Oxygen   Interval Oxygen?  Yes     Baseline Oxygen Saturation %  98 %     1 Minute Oxygen Saturation %  100 %     1 Minute Liters of Oxygen  0 L Room Air     2 Minute Oxygen Saturation %  98 %     2 Minute Liters of Oxygen  0 L     3 Minute Oxygen Saturation %  95 %     3 Minute Liters of Oxygen  0 L     4 Minute Oxygen Saturation %  95 %     4 Minute Liters of Oxygen  0 L     5 Minute Oxygen Saturation %  95 %     5 Minute Liters of Oxygen  0 L     6 Minute Oxygen Saturation %  96 %  6 Minute Liters of Oxygen  0 L     2 Minute Post Oxygen Saturation %  100 %     2 Minute Post Liters of Oxygen  0 L        Psychological, QOL, Others - Outcomes: PHQ 2/9: Depression [facility] 07/17/2017 04/04/2017 04/18/2016 01/14/2016 08/14/2015  Decreased Interest 0 2 0 0 -  Down, Depressed, Hopeless 0 3 0 0 3  PHQ - 2 Score 0 5 0 0 3  Altered sleeping 0 0 - - 2  Tired, decreased energy 1 0 - - 1  Change in appetite 0 0 - - 1  Feeling bad or failure about yourself  1 1 - - 0  Trouble concentrating 1 1 - - 1  Moving slowly or fidgety/restless 0 0 - - 0  Suicidal thoughts 0 0 - - 0  PHQ-9 Score 3 7 - - 8  Difficult doing work/chores Not difficult at all Not difficult at all - - -    Quality of Life:   Personal Goals: Goals established at orientation with interventions provided to work toward goal. Personal Goals and Risk Factors at Admission - 07/17/17 1431      Core Components/Risk Factors/Patient Goals on Admission    Weight Management  Yes;Weight Loss    Intervention  Weight Management: Develop a combined nutrition and exercise program designed to reach desired caloric intake, while maintaining appropriate intake of nutrient and fiber, sodium and fats, and appropriate energy expenditure required for the weight goal.;Weight Management: Provide education and appropriate resources to help participant work on and attain dietary goals.;Weight Management/Obesity: Establish  reasonable short term and long term weight goals.    Admit Weight  211 lb 12.8 oz (96.1 kg)    Goal Weight: Short Term  205 lb (93 kg)    Goal Weight: Long Term  175 lb (79.4 kg)    Expected Outcomes  Short Term: Continue to assess and modify interventions until short term weight is achieved;Long Term: Adherence to nutrition and physical activity/exercise program aimed toward attainment of established weight goal;Weight Loss: Understanding of general recommendations for a balanced deficit meal plan, which promotes 1-2 lb weight loss per week and includes a negative energy balance of [id number] kcal/d;Understanding recommendations for meals to include 15-35% energy as protein, 25-35% energy from fat, 35-60% energy from carbohydrates, less than 200mg  of dietary cholesterol, 20-35 gm of total fiber daily;Understanding of distribution of calorie intake throughout the day with the consumption of 4-5 meals/snacks    Improve shortness of breath with ADL's  Yes    Intervention  Provide education, individualized exercise plan and daily activity instruction to help decrease symptoms of SOB with activities of daily living.    Expected Outcomes  Short Term: Achieves a reduction of symptoms when performing activities of daily living.    Hypertension  Yes    Intervention  Provide education on lifestyle modifcations including regular physical activity/exercise, weight management, moderate sodium restriction and increased consumption of fresh fruit, vegetables, and low fat dairy, alcohol moderation, and smoking cessation.;Monitor prescription use compliance.    Expected Outcomes  Short Term: Continued assessment and intervention until BP is < 140/3290mm HG in hypertensive participants. < 130/480mm HG in hypertensive participants with diabetes, heart failure or chronic kidney disease.;Long Term: Maintenance of blood pressure at goal levels.    Stress  Yes    Intervention  Offer individual and/or small group education and  counseling on adjustment to heart disease, stress management and  health-related lifestyle change. Teach and support self-help strategies.;Refer participants experiencing significant psychosocial distress to appropriate mental health specialists for further evaluation and treatment. When possible, include family members and significant others in education/counseling sessions.    Expected Outcomes  Short Term: Participant demonstrates changes in health-related behavior, relaxation and other stress management skills, ability to obtain effective social support, and compliance with psychotropic medications if prescribed.;Long Term: Emotional wellbeing is indicated by absence of clinically significant psychosocial distress or social isolation.        Personal Goals Discharge: Goals and Risk Factor Review    Row Name 08/11/17 1410             Core Components/Risk Factors/Patient Goals Review   Personal Goals Review  Weight Management/Obesity;Hypertension       Review  Talbert ForestShirley is doing well in rehab. She enjoys coming to class and exercising.  Her weight has not budged, but she would still like to lose more weight.  She has an appointment set up with the dietician for Dec 3 to help with weight loss.  Jacquita's blood pressures have been good in rehab.  She does check them on occasion at home and they have all been within normal limits.        Expected Outcomes  Short: Continue to work on weight loss and go to dietician appointment.  Long: Contiue to work on weight loss and monitoring her blood pressures.           Exercise Goals and Review: Exercise Goals    Row Name 07/17/17 1544             Exercise Goals   Increase Physical Activity  Yes       Intervention  Provide advice, education, support and counseling about physical activity/exercise needs.;Develop an individualized exercise prescription for aerobic and resistive training based on initial evaluation findings, risk stratification,  comorbidities and participant's personal goals.       Expected Outcomes  Achievement of increased cardiorespiratory fitness and enhanced flexibility, muscular endurance and strength shown through measurements of functional capacity and personal statement of participant.       Increase Strength and Stamina  Yes       Intervention  Provide advice, education, support and counseling about physical activity/exercise needs.;Develop an individualized exercise prescription for aerobic and resistive training based on initial evaluation findings, risk stratification, comorbidities and participant's personal goals.       Expected Outcomes  Achievement of increased cardiorespiratory fitness and enhanced flexibility, muscular endurance and strength shown through measurements of functional capacity and personal statement of participant.       Able to understand and use rate of perceived exertion (RPE) scale  Yes       Intervention  Provide education and explanation on how to use RPE scale       Expected Outcomes  Short Term: Able to use RPE daily in rehab to express subjective intensity level;Long Term:  Able to use RPE to guide intensity level when exercising independently       Able to understand and use Dyspnea scale  Yes       Intervention  Provide education and explanation on how to use Dyspnea scale       Expected Outcomes  Short Term: Able to use Dyspnea scale daily in rehab to express subjective sense of shortness of breath during exertion;Long Term: Able to use Dyspnea scale to guide intensity level when exercising independently       Knowledge and understanding  of Target Heart Rate Range (THRR)  Yes       Intervention  Provide education and explanation of THRR including how the numbers were predicted and where they are located for reference       Expected Outcomes  Short Term: Able to state/look up THRR;Long Term: Able to use THRR to govern intensity when exercising independently;Short Term: Able to use  daily as guideline for intensity in rehab       Able to check pulse independently  Yes       Intervention  Provide education and demonstration on how to check pulse in carotid and radial arteries.;Review the importance of being able to check your own pulse for safety during independent exercise       Expected Outcomes  Short Term: Able to explain why pulse checking is important during independent exercise;Long Term: Able to check pulse independently and accurately       Understanding of Exercise Prescription  Yes       Intervention  Provide education, explanation, and written materials on patient's individual exercise prescription       Expected Outcomes  Short Term: Able to explain program exercise prescription;Long Term: Able to explain home exercise prescription to exercise independently          Nutrition & Weight - Outcomes: Pre Biometrics - 07/17/17 1545      Pre Biometrics   Height  5' 4.7" (1.643 m)    Weight  211 lb 8 oz (95.9 kg)    Waist Circumference  43 inches    Hip Circumference  49.7 inches    Waist to Hip Ratio  0.87 %    BMI (Calculated)  35.54        Nutrition: Nutrition Therapy & Goals - 08/28/17 1353      Nutrition Therapy   Diet  Patient did not show for nutrition appointment this morning (08/28/17)       Nutrition Discharge: Nutrition Assessments - 07/17/17 1414      MEDFICTS Scores   Pre Score  33       Education Questionnaire Score: Knowledge Questionnaire Score - 07/17/17 1416      Knowledge Questionnaire Score   Pre Score  7/10       Goals reviewed with patient; copy given to patient.

## 2017-09-15 NOTE — Progress Notes (Signed)
Pulmonary Individual Treatment Plan  Patient Details  Name: Sharon Henderson MRN: [id number] Date of Birth: 06/11/1941 Referring Provider:     Pulmonary Rehab from 07/17/2017 in [facility] Cardiac and Pulmonary Rehab  Referring Provider  Jaclynn Major MD      Initial Encounter Date:    Pulmonary Rehab from 07/17/2017 in [facility] Cardiac and Pulmonary Rehab  Date  07/17/17  Referring Provider  Jaclynn Major MD      Visit Diagnosis: Asthma with chronic obstructive pulmonary disease (COPD) (HCC)  Patient's Home Medications on Admission:  Current Outpatient Medications:  .  acetaminophen (TYLENOL) 500 MG tablet, Take 1,000 mg by mouth every 8 (eight) hours as needed., Disp: , Rfl:  .  albuterol (PROVENTIL HFA;VENTOLIN HFA) 108 (90 Base) MCG/ACT inhaler, Inhale 2 puffs into the lungs every 4 (four) hours as needed for wheezing or shortness of breath (cough)., Disp: 1 Inhaler, Rfl: 0 .  aspirin EC 81 MG tablet, Take by mouth., Disp: , Rfl:  .  budesonide-formoterol (SYMBICORT) 160-4.5 MCG/ACT inhaler, INHALE 2 INHALATIONS INTO THE LUNGS 2 (TWO) TIMES DAILY., Disp: 10.2 Inhaler, Rfl: 6 .  cetirizine (ZYRTEC) 10 MG tablet, TAKE 1 TABLET (10 MG TOTAL) BY MOUTH DAILY., Disp: 90 tablet, Rfl: 2 .  fluticasone (FLONASE) 50 MCG/ACT nasal spray, PLACE 1-2 SPRAYS INTO BOTH NOSTRILS DAILY., Disp: 16 g, Rfl: 3 .  furosemide (LASIX) 40 MG tablet, Take 1 tablet twice a day except with extra swelling, then take 2 tabs in AM and 1 in early afternoon.  Take extra tabs no more than 5 days., Disp: 70 tablet, Rfl: 5 .  ibuprofen (ADVIL,MOTRIN) 200 MG tablet, Take by mouth., Disp: , Rfl:  .  ipratropium-albuterol (DUONEB) 0.5-2.5 (3) MG/3ML SOLN, Take 3 mLs by nebulization 4 (four) times daily., Disp: , Rfl: 12 .  losartan (COZAAR) 100 MG tablet, Take 1 tablet (100 mg total) by mouth daily., Disp: 90 tablet, Rfl: 3 .  metFORMIN (GLUCOPHAGE) 500 MG tablet, Take 1 tablet (500 mg total) by mouth daily with breakfast., Disp:  90 tablet, Rfl: 3 .  metoprolol tartrate (LOPRESSOR) 100 MG tablet, Take 1 tablet (100 mg total) by mouth 2 (two) times daily., Disp: 180 tablet, Rfl: 3 .  montelukast (SINGULAIR) 10 MG tablet, TAKE 1 TABLET (10 MG TOTAL) BY MOUTH NIGHTLY. FOR ALLERGIES, Disp: 30 tablet, Rfl: 12 .  simvastatin (ZOCOR) 10 MG tablet, Take 1 tablet (10 mg total) by mouth at bedtime., Disp: 90 tablet, Rfl: 3 .  tiotropium (SPIRIVA) 18 MCG inhalation capsule, Place into inhaler and inhale., Disp: , Rfl:   Past Medical History: Past Medical History:  Diagnosis Date  . Allergic rhinitis   . Asthma   . Diabetes (HCC)    type 2  . Diverticulosis   . Hypertension   . Internal hemorrhoids     Tobacco Use: Social History   Tobacco Use  Smoking Status Never Smoker  Smokeless Tobacco Never Used    Labs: Recent Review Flowsheet Data    Labs for ITP Cardiac and Pulmonary Rehab Latest Ref Rng & Units 10/06/2016 10/11/2016 01/17/2017 04/20/2017 07/27/2017   Cholestrol <200 mg/dL - 409(W) - - -   LDLCALC <100 mg/dL - 119(J) - - -   HDL >47 mg/dL - 61 - - -   Trlycerides <150 mg/dL - 87 - - -   Hemoglobin A1c 5.6 6.9% - 6.9 7.1(A) 6.8(A)       Pulmonary Assessment Scores: Pulmonary Assessment Scores    Row Name 07/17/17  1415         ADL UCSD   ADL Phase  Entry     SOB Score total  89     Rest  3     Walk  5     Stairs  5     Bath  3     Dress  3     Shop  3       CAT Score   CAT Score  31       mMRC Score   mMRC Score  3        Pulmonary Function Assessment: Pulmonary Function Assessment - 07/17/17 1445      Initial Spirometry Results   FVC%  50 %    FEV1%  52 %    FEV1/FVC Ratio  81.24    Comments  best of 2 results, good patient effort      Post Bronchodilator Spirometry Results   FVC%  43 %    FEV1%  42.64 %    FEV1/FVC Ratio  78.02    Comments  best of 2 results, good patient effort      Breath   Bilateral Breath Sounds  Clear slight wheezes in bases    Shortness of Breath   Limiting activity;Fear of Shortness of Breath;No;Panic with Shortness of Breath       Exercise Target Goals:    Exercise Program Goal: Individual exercise prescription set with THRR, safety & activity barriers. Participant demonstrates ability to understand and report RPE using BORG scale, to self-measure pulse accurately, and to acknowledge the importance of the exercise prescription.  Exercise Prescription Goal: Starting with aerobic activity 30 plus minutes a day, 3 days per week for initial exercise prescription. Provide home exercise prescription and guidelines that participant acknowledges understanding prior to discharge.  Activity Barriers & Risk Stratification: Activity Barriers & Cardiac Risk Stratification - 07/17/17 1536      Activity Barriers & Cardiac Risk Stratification   Activity Barriers  Shortness of Breath;Muscular Weakness;Balance Concerns;Other (comment)    Comments  Left Knee stiffness - residual to a past accident        6 Minute Walk: 6 Minute Walk    Row Name 07/17/17 1532         6 Minute Walk   Phase  Initial     Distance  433 feet     Walk Time  4.91 minutes     # of Rest Breaks  1 test was stopped at 4:55      MPH  1     METS  0.59     RPE  11     Perceived Dyspnea   2     VO2 Peak  2.08     Symptoms  Yes (comment)     Comments  Shortness of breath      Resting HR  57 bpm     Resting BP  128/72     Resting Oxygen Saturation   98 %     Exercise Oxygen Saturation  during 6 min walk  95 %     Max Ex. HR  113 bpm     Max Ex. BP  124/70     2 Minute Post BP  116/64       Interval HR   1 Minute HR  94     2 Minute HR  89     3 Minute HR  103     4 Minute HR  105     5 Minute HR  113 test stopped at 4:55     6 Minute HR  82     2 Minute Post HR  66     Interval Heart Rate?  Yes       Interval Oxygen   Interval Oxygen?  Yes     Baseline Oxygen Saturation %  98 %     1 Minute Oxygen Saturation %  100 %     1 Minute Liters of Oxygen  0  L Room Air     2 Minute Oxygen Saturation %  98 %     2 Minute Liters of Oxygen  0 L     3 Minute Oxygen Saturation %  95 %     3 Minute Liters of Oxygen  0 L     4 Minute Oxygen Saturation %  95 %     4 Minute Liters of Oxygen  0 L     5 Minute Oxygen Saturation %  95 %     5 Minute Liters of Oxygen  0 L     6 Minute Oxygen Saturation %  96 %     6 Minute Liters of Oxygen  0 L     2 Minute Post Oxygen Saturation %  100 %     2 Minute Post Liters of Oxygen  0 L       Oxygen Initial Assessment: Oxygen Initial Assessment - 07/17/17 1400      Home Oxygen   Home Oxygen Device  None    Sleep Oxygen Prescription  None    Home Exercise Oxygen Prescription  None    Home at Rest Exercise Oxygen Prescription  None    Compliance with Home Oxygen Use  -- does not have home oxygen      Initial 6 min Walk   Oxygen Used  None      Program Oxygen Prescription   Program Oxygen Prescription  None      Intervention   Short Term Goals  To learn and understand importance of maintaining oxygen saturations>88%;To learn and demonstrate proper use of respiratory medications;To learn and demonstrate proper pursed lip breathing techniques or other breathing techniques.;To learn and understand importance of monitoring SPO2 with pulse oximeter and demonstrate accurate use of the pulse oximeter.    Long  Term Goals  Verbalizes importance of monitoring SPO2 with pulse oximeter and return demonstration;Exhibits proper breathing techniques, such as pursed lip breathing or other method taught during program session;Demonstrates proper use of MDI's;Maintenance of O2 saturations>88%;Compliance with respiratory medication       Oxygen Re-Evaluation: Oxygen Re-Evaluation    Row Name 07/24/17 1151             Program Oxygen Prescription   Program Oxygen Prescription  None         Home Oxygen   Home Oxygen Device  None       Sleep Oxygen Prescription  None       Home Exercise Oxygen Prescription  None        Home at Rest Exercise Oxygen Prescription  None         Goals/Expected Outcomes   Short Term Goals  To learn and understand importance of maintaining oxygen saturations>88%;To learn and demonstrate proper use of respiratory medications;To learn and demonstrate proper pursed lip breathing techniques or other breathing techniques.;To learn and understand importance of monitoring SPO2 with pulse oximeter and demonstrate accurate use of the  pulse oximeter.       Long  Term Goals  Verbalizes importance of monitoring SPO2 with pulse oximeter and return demonstration;Exhibits proper breathing techniques, such as pursed lip breathing or other method taught during program session;Compliance with respiratory medication;Maintenance of O2 saturations>88%;Demonstrates proper use of MDI's       Comments  Reviewed PLB technique with pt.  Talked about how it work and it's important to maintaining his exercise saturations.         Goals/Expected Outcomes  Short: Become more profiecient at using PLB.   Long: Become independent at using PLB.          Oxygen Discharge (Final Oxygen Re-Evaluation): Oxygen Re-Evaluation - 07/24/17 1151      Program Oxygen Prescription   Program Oxygen Prescription  None      Home Oxygen   Home Oxygen Device  None    Sleep Oxygen Prescription  None    Home Exercise Oxygen Prescription  None    Home at Rest Exercise Oxygen Prescription  None      Goals/Expected Outcomes   Short Term Goals  To learn and understand importance of maintaining oxygen saturations>88%;To learn and demonstrate proper use of respiratory medications;To learn and demonstrate proper pursed lip breathing techniques or other breathing techniques.;To learn and understand importance of monitoring SPO2 with pulse oximeter and demonstrate accurate use of the pulse oximeter.    Long  Term Goals  Verbalizes importance of monitoring SPO2 with pulse oximeter and return demonstration;Exhibits proper breathing  techniques, such as pursed lip breathing or other method taught during program session;Compliance with respiratory medication;Maintenance of O2 saturations>88%;Demonstrates proper use of MDI's    Comments  Reviewed PLB technique with pt.  Talked about how it work and it's important to maintaining his exercise saturations.      Goals/Expected Outcomes  Short: Become more profiecient at using PLB.   Long: Become independent at using PLB.       Initial Exercise Prescription: Initial Exercise Prescription - 07/17/17 1500      Date of Initial Exercise RX and Referring Provider   Date  07/17/17    Referring Provider  Jaclynn Major MD      Treadmill   MPH  1    Grade  0    Minutes  15 5 intervals of 3 minutes     METs  1.77      NuStep   Level  1    SPM  80    Minutes  15    METs  3      Arm Ergometer   Level  1    Watts  5    RPM  25    Minutes  15    METs  3      Prescription Details   Frequency (times per week)  2    Duration  Progress to 45 minutes of aerobic exercise without signs/symptoms of physical distress      Intensity   THRR 40-80% of Max Heartrate  91-125    Ratings of Perceived Exertion  11-13    Perceived Dyspnea  0-4      Progression   Progression  Continue to progress workloads to maintain intensity without signs/symptoms of physical distress.      Resistance Training   Training Prescription  Yes    Weight  2    Reps  10-15       Perform Capillary Blood Glucose checks as needed.  Exercise Prescription Changes: Exercise  Prescription Changes    Row Name 07/17/17 1500 08/01/17 1500 08/11/17 1400 08/15/17 1300       Response to Exercise   Blood Pressure (Admit)  128/72  134/72  -  136/74    Blood Pressure (Exercise)  124/70  -  -  -    Blood Pressure (Exit)  116/64  120/58  -  122/64    Heart Rate (Admit)  57 bpm  66 bpm  -  73 bpm    Heart Rate (Exercise)  113 bpm  79 bpm  -  65 bpm    Heart Rate (Exit)  66 bpm  75 bpm  -  60 bpm    Oxygen  Saturation (Admit)  98 %  99 %  -  97 %    Oxygen Saturation (Exercise)  95 %  99 %  -  97 %    Oxygen Saturation (Exit)  100 %  100 %  -  97 %    Rating of Perceived Exertion (Exercise)  11  13  -  13    Perceived Dyspnea (Exercise)  2  3  -  1    Symptoms  SOB   SOB  -  SOB    Comments  walk test completed  -  -  -    Duration  -  Continue with 45 min of aerobic exercise without signs/symptoms of physical distress.  -  Continue with 45 min of aerobic exercise without signs/symptoms of physical distress.    Intensity  -  THRR unchanged  -  THRR unchanged      Progression   Progression  -  Continue to progress workloads to maintain intensity without signs/symptoms of physical distress.  -  Continue to progress workloads to maintain intensity without signs/symptoms of physical distress.    Average METs  -  1.97  -  1.95      Resistance Training   Training Prescription  -  Yes  -  Yes    Weight  -  2 lbs  -  2 lbs    Reps  -  10-15  -  10-15      Interval Training   Interval Training  -  No  -  -      NuStep   Level  -  1  -  1    SPM  -  47  -  47    Minutes  -  15  -  15    METs  -  2.1  -  2.3      Arm Ergometer   Level  -  1  -  1    RPM  -  42  -  -    Minutes  -  15  -  15    METs  -  2.2  -  -      Track   Laps  -  13  -  12    Minutes  -  15  -  15    METs  -  1.6  -  1.6      Home Exercise Plan   Plans to continue exercise at  -  -  [facility]    Frequency  -  -  Add 1 additional day to program exercise sessions.  Add 1 additional day to program exercise sessions.    Initial Home Exercises  Provided  -  -  08/11/17  08/11/17       Exercise Comments: Exercise Comments    Row Name 07/24/17 1150           Exercise Comments   First full day of exercise!  Patient was oriented to gym and equipment including functions, settings, policies, and procedures.  Patient's individual exercise prescription  and treatment plan were reviewed.  All starting workloads were established based on the results of the 6 minute walk test done at initial orientation visit.  The plan for exercise progression was also introduced and progression will be customized based on patient's performance and goals.          Exercise Goals and Review: Exercise Goals    Row Name 07/17/17 1544             Exercise Goals   Increase Physical Activity  Yes       Intervention  Provide advice, education, support and counseling about physical activity/exercise needs.;Develop an individualized exercise prescription for aerobic and resistive training based on initial evaluation findings, risk stratification, comorbidities and participant's personal goals.       Expected Outcomes  Achievement of increased cardiorespiratory fitness and enhanced flexibility, muscular endurance and strength shown through measurements of functional capacity and personal statement of participant.       Increase Strength and Stamina  Yes       Intervention  Provide advice, education, support and counseling about physical activity/exercise needs.;Develop an individualized exercise prescription for aerobic and resistive training based on initial evaluation findings, risk stratification, comorbidities and participant's personal goals.       Expected Outcomes  Achievement of increased cardiorespiratory fitness and enhanced flexibility, muscular endurance and strength shown through measurements of functional capacity and personal statement of participant.       Able to understand and use rate of perceived exertion (RPE) scale  Yes       Intervention  Provide education and explanation on how to use RPE scale       Expected Outcomes  Short Term: Able to use RPE daily in rehab to express subjective intensity level;Long Term:  Able to use RPE to guide intensity level when exercising independently       Able to understand and use Dyspnea scale  Yes        Intervention  Provide education and explanation on how to use Dyspnea scale       Expected Outcomes  Short Term: Able to use Dyspnea scale daily in rehab to express subjective sense of shortness of breath during exertion;Long Term: Able to use Dyspnea scale to guide intensity level when exercising independently       Knowledge and understanding of Target Heart Rate Range (THRR)  Yes       Intervention  Provide education and explanation of THRR including how the numbers were predicted and where they are located for reference       Expected Outcomes  Short Term: Able to state/look up THRR;Long Term: Able to use THRR to govern intensity when exercising independently;Short Term: Able to use daily as guideline for intensity in rehab       Able to check pulse independently  Yes       Intervention  Provide education and demonstration on how to check pulse in carotid and radial arteries.;Review the importance of being able to check your own pulse for safety during independent exercise       Expected Outcomes  Short Term:  Able to explain why pulse checking is important during independent exercise;Long Term: Able to check pulse independently and accurately       Understanding of Exercise Prescription  Yes       Intervention  Provide education, explanation, and written materials on patient's individual exercise prescription       Expected Outcomes  Short Term: Able to explain program exercise prescription;Long Term: Able to explain home exercise prescription to exercise independently          Exercise Goals Re-Evaluation : Exercise Goals Re-Evaluation    Row Name 07/24/17 1150 08/01/17 1553 08/11/17 1408 08/15/17 1349 08/29/17 1527     Exercise Goal Re-Evaluation   Exercise Goals Review  Understanding of Exercise Prescription;Knowledge and understanding of Target Heart Rate Range (THRR);Able to understand and use Dyspnea scale;Able to understand and use rate of perceived exertion (RPE) scale  Increase  Physical Activity;Increase Strength and Stamina;Understanding of Exercise Prescription  Increase Physical Activity;Increase Strength and Stamina;Understanding of Exercise Prescription;Knowledge and understanding of Target Heart Rate Range (THRR);Able to understand and use Dyspnea scale;Able to check pulse independently;Able to understand and use rate of perceived exertion (RPE) scale  Increase Physical Activity;Increase Strength and Stamina;Understanding of Exercise Prescription  -   Comments  Reviewed RPE scale, THR and program prescription with pt today.  Pt voiced understanding and was given a copy of goals to take home.   Sharon Henderson is off to a good start in rehab.  She has completed three full days of exercise.  She is already getting in 13 laps on the track.  We will continue to monitor her progression.   Reviewed home exercise with pt today.  [facility]  She is going to start with one extra day a week at home for a total of three days a week.  We also about coming in to 10am [facility] class on Wednesdays as she has a [employer] commitment.  Reviewed THR, pulse, RPE, sign and symptoms, NTG use, and when to call 911 or MD.  Also discussed weather considerations and indoor options.  Pt voiced understanding. She has also mentioned that her knees have been hurting after exercise.  We talked about icing her knees down after exercise.  Sharon Henderson [facility] only [facility]  She will need to attend more to really work on workload increases or do more at home.  We will continue to monitor her progression.  Out since last review   Expected Outcomes  Short: Use RPE daily to regulate intensity.  Long: Follow program prescription in THR.  Short: Start to increase workloads and review home exercise guidelines.  Long: Continue to attend rehab regularly.   Short: Add in at least one extra day at home for three days a week. Ice knees after exercise.   Long:  Try to exercise more at home consistently  Short: Ice knees after knees.  Long: Exercise more consistently.  -      Discharge Exercise Prescription (Final Exercise Prescription Changes): Exercise Prescription Changes - 08/15/17 1300      Response to Exercise   Blood Pressure (Admit)  136/74    Blood Pressure (Exit)  122/64    Heart Rate (Admit)  73 bpm    Heart Rate (Exercise)  65 bpm    Heart Rate (Exit)  60 bpm    Oxygen Saturation (Admit)  97 %    Oxygen Saturation (Exercise)  97 %  Oxygen Saturation (Exit)  97 %    Rating of Perceived Exertion (Exercise)  13    Perceived Dyspnea (Exercise)  1    Symptoms  SOB    Duration  Continue with 45 min of aerobic exercise without signs/symptoms of physical distress.    Intensity  THRR unchanged      Progression   Progression  Continue to progress workloads to maintain intensity without signs/symptoms of physical distress.    Average METs  1.95      Resistance Training   Training Prescription  Yes    Weight  2 lbs    Reps  10-15      NuStep   Level  1    SPM  47    Minutes  15    METs  2.3      Arm Ergometer   Level  1    Minutes  15      Track   Laps  12    Minutes  15    METs  1.6      Home Exercise Plan   Plans to continue exercise at  [facility]    Frequency  Add 1 additional day to program exercise sessions.    Initial Home Exercises Provided  08/11/17       Nutrition:  Target Goals: Understanding of nutrition guidelines, daily intake of sodium 1500mg , cholesterol 200mg , calories 30% from fat and 7% or less from saturated fats, daily to have 5 or more servings of fruits and vegetables.  Biometrics: Pre Biometrics - 07/17/17 1545      Pre Biometrics   Height  5' 4.7" (1.643 m)    Weight  211 lb 8 oz (95.9 kg)    Waist Circumference  43 inches    Hip Circumference  49.7 inches    Waist to Hip Ratio  0.87 %    BMI (Calculated)  35.54        Nutrition Therapy Plan and  Nutrition Goals: Nutrition Therapy & Goals - 08/28/17 1353      Nutrition Therapy   Diet  Patient did not show for nutrition appointment this morning (08/28/17)       Nutrition Discharge: Rate Your Plate Scores: Nutrition Assessments - 07/17/17 1414      MEDFICTS Scores   Pre Score  33       Nutrition Goals Re-Evaluation: Nutrition Goals Re-Evaluation    Row Name 08/11/17 1417             Goals   Nutrition Goal  Weight Loss, Diet to help with weight loss       Comment  Sharon Henderson has an appointment scheduled for Dec 3.  She is already watching her sugar and salt intake; however, she would like to get a boost on her weight loss.       Expected Outcome  Short: Meet with dietician  Long: Follow goal and guidelines laid out by dietician for weight loss          Nutrition Goals Discharge (Final Nutrition Goals Re-Evaluation): Nutrition Goals Re-Evaluation - 08/11/17 1417      Goals   Nutrition Goal  Weight Loss, Diet to help with weight loss    Comment  Sharon Henderson has an appointment scheduled for Dec 3.  She is already watching her sugar and salt intake; however, she would like to get a boost on her weight loss.    Expected Outcome  Short: Meet with dietician  Long:  Follow goal and guidelines laid out by dietician for weight loss       Psychosocial: Target Goals: Acknowledge presence or absence of significant depression and/or stress, maximize coping skills, provide positive support system. Participant is able to verbalize types and ability to use techniques and skills needed for reducing stress and depression.   Initial Review & Psychosocial Screening: Initial Psych Review & Screening - 07/17/17 1410      Initial Review   Current issues with  Current Stress Concerns    Source of Stress Concerns  Chronic Illness    Comments  Her ADL's are limited with her disease      Family Dynamics   Good Support System?  Yes    Comments  Her four daughters are a great support system and  three sisters.      Barriers   Psychosocial barriers to participate in program  There are no identifiable barriers or psychosocial needs.      Screening Interventions   Interventions  Yes;Encouraged to exercise;Provide feedback about the scores to participant;Program counselor consult    Expected Outcomes  Short Term goal: Utilizing psychosocial counselor, staff and physician to assist with identification of specific Stressors or current issues interfering with healing process. Setting desired goal for each stressor or current issue identified.;Long Term Goal: Stressors or current issues are controlled or eliminated.;Short Term goal: Identification and review with participant of any Quality of Life or Depression concerns found by scoring the questionnaire.;Long Term goal: The participant improves quality of Life and PHQ9 Scores as seen by post scores and/or verbalization of changes       Quality of Life Scores:   PHQ-9: Recent Review Flowsheet Data    Depression screen [facility] 2/9 07/17/2017 04/04/2017 04/18/2016 01/14/2016 08/14/2015   Decreased Interest 0 2 0 0 -   Down, Depressed, Hopeless 0 3 0 0 3   PHQ - 2 Score 0 5 0 0 3   Altered sleeping 0 0 - - 2   Tired, decreased energy 1 0 - - 1   Change in appetite 0 0 - - 1   Feeling bad or failure about yourself  1 1 - - 0   Trouble concentrating 1 1 - - 1   Moving slowly or fidgety/restless 0 0 - - 0   Suicidal thoughts 0 0 - - 0   PHQ-9 Score 3 7 - - 8   Difficult doing work/chores Not difficult at all Not difficult at all - - -     Interpretation of Total Score  Total Score Depression Severity:  1-4 = Minimal depression, 5-9 = Mild depression, 10-14 = Moderate depression, 15-19 = Moderately severe depression, 20-27 = Severe depression   Psychosocial Evaluation and Intervention:   Psychosocial Re-Evaluation: Psychosocial Re-Evaluation    Row Name 08/11/17 1419             Psychosocial Re-Evaluation   Current issues with   Current Stress Concerns       Comments  Sharon Henderson has been doing well in rehab.  She enjoys coming to rehab.  Her favorite part has been attending the education classes.  She only comes on Mondays and Fridays as she has a church commitment on Wednesdays.  We talked about trying to come to the 10am class on Wednesdays and she is going to think about that option.  She can already tell that she is breatihing better.  She denies depression or anxiety sysmptoms.  She sleeps well and does not  have any financial issues.   She has also mentioned that her knees have been hurting after exercise.  We talked about icing her knees down after exercise.       Expected Outcomes  Short: Continue to attend rehab and decide about trying to come on Wednesdays.  Long: Continue to attend rehab and stay positive.       Interventions  Encouraged to attend Cardiac Rehabilitation for the exercise;Stress management education       Continue Psychosocial Services   Follow up required by staff          Psychosocial Discharge (Final Psychosocial Re-Evaluation): Psychosocial Re-Evaluation - 08/11/17 1419      Psychosocial Re-Evaluation   Current issues with  Current Stress Concerns    Comments  Sharon Henderson has been doing well in rehab.  She enjoys coming to rehab.  Her favorite part has been attending the education classes.  She only comes on Mondays and Fridays as she has a church commitment on Wednesdays.  We talked about trying to come to the 10am class on Wednesdays and she is going to think about that option.  She can already tell that she is breatihing better.  She denies depression or anxiety sysmptoms.  She sleeps well and does not have any financial issues.   She has also mentioned that her knees have been hurting after exercise.  We talked about icing her knees down after exercise.    Expected Outcomes  Short: Continue to attend rehab and decide about trying to come on Wednesdays.  Long: Continue to attend rehab and stay  positive.    Interventions  Encouraged to attend Cardiac Rehabilitation for the exercise;Stress management education    Continue Psychosocial Services   Follow up required by staff       Education: Education Goals: Education classes will be provided on a weekly basis, covering required topics. Participant will state understanding/return demonstration of topics presented.  Learning Barriers/Preferences: Learning Barriers/Preferences - 07/17/17 1449      Learning Barriers/Preferences   Learning Barriers  Sight wears glasses    Learning Preferences  None       Education Topics: Initial Evaluation Education: - Verbal, written and demonstration of respiratory meds, RPE/PD scales, oximetry and breathing techniques. Instruction on use of nebulizers and MDIs: cleaning and proper use, rinsing mouth with steroid doses and importance of monitoring MDI activations.   Pulmonary Rehab from 08/11/2017 in [facility] Cardiac and Pulmonary Rehab  Date  07/17/17  Educator  [facility]  Instruction Review Code  1- Verbalizes Understanding      General Nutrition Guidelines/Fats and Fiber: -Group instruction provided by verbal, written material, models and posters to present the general guidelines for heart healthy nutrition. Gives an explanation and review of dietary fats and fiber.   Controlling Sodium/Reading Food Labels: -Group verbal and written material supporting the discussion of sodium use in heart healthy nutrition. Review and explanation with models, verbal and written materials for utilization of the food label.   Exercise Physiology & Risk Factors: - Group verbal and written instruction with models to review the exercise physiology of the cardiovascular system and associated critical values. Details cardiovascular disease risk factors and the goals associated with each risk factor.   Aerobic Exercise & Resistance Training: - Gives group verbal and written discussion on the health impact of  inactivity. On the components of aerobic and resistive training programs and the benefits of this training and how to safely progress through these programs.   Pulmonary  Rehab from 08/11/2017 in [facility] Cardiac and Pulmonary Rehab  Date  07/28/17  Educator  [facility]  Instruction Review Code  1- Verbalizes Understanding      Flexibility, Balance, General Exercise Guidelines: - Provides group verbal and written instruction on the benefits of flexibility and balance training programs. Provides general exercise guidelines with specific guidelines to those with heart or lung disease. Demonstration and skill practice provided.   Stress Management: - Provides group verbal and written instruction about the health risks of elevated stress, cause of high stress, and healthy ways to reduce stress.   Depression: - Provides group verbal and written instruction on the correlation between heart/lung disease and depressed mood, treatment options, and the stigmas associated with seeking treatment.   Exercise & Equipment Safety: - Individual verbal instruction and demonstration of equipment use and safety with use of the equipment.   Pulmonary Rehab from 08/11/2017 in [facility] Cardiac and Pulmonary Rehab  Date  07/17/17  Educator  [facility]  Instruction Review Code  1- Verbalizes Understanding      Infection Prevention: - Provides verbal and written material to individual with discussion of infection control including proper hand washing and proper equipment cleaning during exercise session.   Pulmonary Rehab from 08/11/2017 in [facility] Cardiac and Pulmonary Rehab  Date  07/17/17  Educator  [facility]  Instruction Review Code  1- Verbalizes Understanding      Falls Prevention: - Provides verbal and written material to individual with discussion of falls prevention and safety.   Pulmonary Rehab from 08/11/2017 in [facility] Cardiac and Pulmonary Rehab  Date  07/17/17  Educator  [facility]  Instruction Review Code  1- Verbalizes  Understanding      Diabetes: - Individual verbal and written instruction to review signs/symptoms of diabetes, desired ranges of glucose level fasting, after meals and with exercise. Advice that pre and post exercise glucose checks will be done for 3 sessions at entry of program.   Chronic Lung Diseases: - Group verbal and written instruction to review new updates, new respiratory medications, new advancements in procedures and treatments. Provide informative websites and "800" numbers of self-education.   Lung Procedures: - Group verbal and written instruction to describe testing methods done to diagnose lung disease. Review the outcome of test results. Describe the treatment choices: Pulmonary Function Tests, ABGs and oximetry.   Energy Conservation: - Provide group verbal and written instruction for methods to conserve energy, plan and organize activities. Instruct on pacing techniques, use of adaptive equipment and posture/positioning to relieve shortness of breath.   Triggers: - Group verbal and written instruction to review types of environmental controls: home humidity, furnaces, filters, dust mite/pet prevention, HEPA vacuums. To discuss weather changes, air quality and the benefits of nasal washing.   Exacerbations: - Group verbal and written instruction to provide: warning signs, infection symptoms, calling MD promptly, preventive modes, and value of vaccinations. Review: effective airway clearance, coughing and/or vibration techniques. Create an Sport and exercise psychologist.   Oxygen: - Individual and group verbal and written instruction on oxygen therapy. Includes supplement oxygen, available portable oxygen systems, continuous and intermittent flow rates, oxygen safety, concentrators, and Medicare reimbursement for oxygen.   Pulmonary Rehab from 08/11/2017 in [facility] Cardiac and Pulmonary Rehab  Date  07/17/17  Educator  [facility]  Instruction Review Code  1- Verbalizes Understanding       Respiratory Medications: - Group verbal and written instruction to review medications for lung disease. Drug class, frequency, complications, importance of spacers, rinsing mouth after steroid MDI's,  and proper cleaning methods for nebulizers.   [facility]  Date  07/17/17  [facility]  Instruction Review Code  1- Verbalizes Understanding      AED/CPR: - Group verbal and written instruction with the use of models to demonstrate the basic use of the AED with the basic ABC's of resuscitation.   Breathing Retraining: - Provides individuals verbal and written instruction on purpose, frequency, and proper technique of diaphragmatic breathing and pursed-lipped breathing. Applies individual practice skills.   [facility]ulmonary Rehab [facility]11[facility]2017 [facility] Cardiac and Pulmonary Rehab  Date  07/17/17  [facility]  Instruction Review Code  1- Verbalizes Understanding      Anatomy and Physiology of the Lungs: - Group verbal and written instruction with the use of models to provide basic lung anatomy and physiology related to function, structure and complications of lung disease.   Anatomy & Physiology of the Heart: - Group verbal and written instruction and models provide basic cardiac anatomy and physiology, with the coronary electrical and arterial systems. Review of: AMI, Angina, Valve disease, Heart Failure, Cardiac Arrhythmia, Pacemakers, and the ICD.   [facility] Card[facility] Pul[facility] Re[facility]  Date  08/11/17  Educator  [facility]  Instruction Review Code  1- Verbalizes Understanding      Heart Failure: - Group verbal and written instruction on the basics of heart failure: signs/symptoms, treatments, explanation of ejection fraction, enlarged heart and cardiomyopathy.   Pulmonary Rehab [facility]11/2017 [facility] Cardiac and Pulmonary Rehab  Date  08/11/17  Educator  [facility]  Instruction Review Code  1-  Verbalizes Understanding      Sleep Apnea: - Individual verbal and written instruction to review Obstructive Sleep Apnea. Review of risk factors, methods for diagnosing and types of masks and machines for OSA.   Anxiety: - Provides group, verbal and written instruction on the correlation between heart/lung disease and anxiety, treatment options, and management of anxiety.   Relaxation: - Provides group, verbal and written instruction about the benefits of relaxation for patients with heart/lung disease. Also provides patients with examples of relaxation techniques.   Cardiac Medications: - Group verbal and written instruction to review commonly prescribed medications for heart disease. Reviews the medication, class of the drug, and side effects.   Know Your Numbers: -Group verbal and written instruction about important numbers in your health.  Review of Cholesterol, Blood Pressure, Diabetes, and BMI and the role they play in your overall health.   Other: -Provides group and verbal instruction on various topics (see comments)    Knowledge Questionnaire Score: Knowledge Questionnaire Score - 07/17/17 1416      Knowledge Questionnaire Score   Pre Score  7/10        Core Components/Risk Factors/Patient Goals at Admission: Personal Goals and Risk Factors at Admission - 07/17/17 1431      Core Components/Risk Factors/Patient Goals on Admission    Weight Management  Yes;Weight Loss    Intervention  Weight Management: Develop a combined nutrition and exercise program designed to reach desired caloric intake, while maintaining appropriate intake of nutrient and fiber, sodium and fats, and appropriate energy expenditure required for the weight goal.;Weight Management: Provide education and appropriate resources to help participant work on and attain dietary goals.;Weight Management/Obesity: Establish reasonable short term and long term weight goals.    Admit Weight  211 lb 12.8 oz  (96.1 kg)    Goal Weight: Short Term  205 lb (93 kg)    Goal Weight: Long Term  175 lb (79.4 kg)    Expected Outcomes  Short Term: Continue to assess and modify interventions until short term weight is achieved;Long Term: Adherence to nutrition and physical activity/exercise program aimed toward attainment of established weight goal;Weight Loss: Understanding of general recommendations for a balanced deficit meal plan, which promotes 1-2 lb weight loss per week and includes a negative energy balance of [phone] kcal/d;Understanding recommendations for meals to include 15-35% energy as protein, 25-35% energy from fat, 35-60% energy from carbohydrates, less than 200mg  of dietary cholesterol, 20-35 gm of total fiber daily;Understanding of distribution of calorie intake throughout the day with the consumption of 4-5 meals/snacks    Improve shortness of breath with ADL's  Yes    Intervention  Provide education, individualized exercise plan and daily activity instruction to help decrease symptoms of SOB with activities of daily living.    Expected Outcomes  Short Term: Achieves a reduction of symptoms when performing activities of daily living.    Hypertension  Yes    Intervention  Provide education on lifestyle modifcations including regular physical activity/exercise, weight management, moderate sodium restriction and increased consumption of fresh fruit, vegetables, and low fat dairy, alcohol moderation, and smoking cessation.;Monitor prescription use compliance.    Expected Outcomes  Short Term: Continued assessment and intervention until BP is < 140/108mm HG in hypertensive participants. < 130/53mm HG in hypertensive participants with diabetes, heart failure or chronic kidney disease.;Long Term: Maintenance of blood pressure at goal levels.    Stress  Yes    Intervention  Offer individual and/or small group education and counseling on adjustment to heart disease, stress management and health-related  lifestyle change. Teach and support self-help strategies.;Refer participants experiencing significant psychosocial distress to appropriate mental health specialists for further evaluation and treatment. When possible, include family members and significant others in education/counseling sessions.    Expected Outcomes  Short Term: Participant demonstrates changes in health-related behavior, relaxation and other stress management skills, ability to obtain effective social support, and compliance with psychotropic medications if prescribed.;Long Term: Emotional wellbeing is indicated by absence of clinically significant psychosocial distress or social isolation.       Core Components/Risk Factors/Patient Goals Review:  Goals and Risk Factor Review    Row Name 08/11/17 1410             Core Components/Risk Factors/Patient Goals Review   Personal Goals Review  Weight Management/Obesity;Hypertension       Review  Sharon Henderson is doing well in rehab. She enjoys coming to class and exercising.  Her weight has not budged, but she would still like to lose more weight.  She has an appointment set up with the dietician for Dec 3 to help with weight loss.  Sharon Henderson's blood pressures have been good in rehab.  She does check them on occasion at home and they have all been within normal limits.        Expected Outcomes  Short: Continue to work on weight loss and go to dietician appointment.  Long: Contiue to work on weight loss and monitoring her blood pressures.           Core Components/Risk Factors/Patient Goals at Discharge (Final Review):  Goals and Risk Factor Review - 08/11/17 1410      Core Components/Risk Factors/Patient Goals Review   Personal Goals Review  Weight Management/Obesity;Hypertension    Review  Sharon Henderson is doing well in rehab. She enjoys coming to class and  exercising.  Her weight has not budged, but she would still like to lose more weight.  She has an appointment set up with the dietician  for Dec 3 to help with weight loss.  Sharon Henderson's blood pressures have been good in rehab.  She does check them on occasion at home and they have all been within normal limits.     Expected Outcomes  Short: Continue to work on weight loss and go to dietician appointment.  Long: Contiue to work on weight loss and monitoring her blood pressures.        ITP Comments: ITP Comments    Row Name 07/17/17 1358 07/24/17 1157 08/14/17 0904 08/29/17 1527 09/11/17 0841   ITP Comments  Medical Evaluation completed. Chart sent for review and changes to Dr. Bethann Punches Director of [facility]. Diagnosis can be found in [facility] encounter 07/17/17  Spent 15 minutes with patient working on PLB techniques.  30 day review completed. ITP sent to Dr. Bethann Punches Director of [facility]. Continue with ITP unless changes are made by physician.    Called to check on status of patient return to program.  She has been out since 11/16.  Unable to leave message and no answer to phone at home.   30 day review completed. ITP sent to Dr. Bethann Punches Director of [facility]. Continue with ITP unless changes are made by physician.     Row Name 09/15/17 1223           ITP Comments  Sent letter for discharge.  Last attended on 08/11/17.  No response to phone calls and unable to leave messages.  Discharge ITP sent and signed by Dr. Hyacinth Meeker.  Discharge Summary routed to PCP and pulmonologist.          Comments: Discharge ITP

## 2017-10-11 DIAGNOSIS — J4540 Moderate persistent asthma, uncomplicated: Secondary | ICD-10-CM | POA: Diagnosis not present

## 2017-10-11 DIAGNOSIS — J449 Chronic obstructive pulmonary disease, unspecified: Secondary | ICD-10-CM | POA: Diagnosis not present

## 2017-10-23 ENCOUNTER — Other Ambulatory Visit: Payer: Self-pay

## 2017-10-27 ENCOUNTER — Encounter: Payer: Self-pay | Admitting: Family Medicine

## 2017-11-11 DIAGNOSIS — J4540 Moderate persistent asthma, uncomplicated: Secondary | ICD-10-CM | POA: Diagnosis not present

## 2017-11-11 DIAGNOSIS — J449 Chronic obstructive pulmonary disease, unspecified: Secondary | ICD-10-CM | POA: Diagnosis not present

## 2017-12-09 DIAGNOSIS — J449 Chronic obstructive pulmonary disease, unspecified: Secondary | ICD-10-CM | POA: Diagnosis not present

## 2017-12-09 DIAGNOSIS — J4540 Moderate persistent asthma, uncomplicated: Secondary | ICD-10-CM | POA: Diagnosis not present

## 2018-01-04 DIAGNOSIS — J40 Bronchitis, not specified as acute or chronic: Secondary | ICD-10-CM | POA: Diagnosis not present

## 2018-01-04 DIAGNOSIS — J4540 Moderate persistent asthma, uncomplicated: Secondary | ICD-10-CM | POA: Diagnosis not present

## 2018-01-04 DIAGNOSIS — J449 Chronic obstructive pulmonary disease, unspecified: Secondary | ICD-10-CM | POA: Diagnosis not present

## 2018-01-09 DIAGNOSIS — J449 Chronic obstructive pulmonary disease, unspecified: Secondary | ICD-10-CM | POA: Diagnosis not present

## 2018-01-09 DIAGNOSIS — J4540 Moderate persistent asthma, uncomplicated: Secondary | ICD-10-CM | POA: Diagnosis not present

## 2018-01-24 ENCOUNTER — Other Ambulatory Visit: Payer: Self-pay

## 2018-01-24 DIAGNOSIS — R600 Localized edema: Secondary | ICD-10-CM

## 2018-01-24 DIAGNOSIS — I10 Essential (primary) hypertension: Secondary | ICD-10-CM

## 2018-01-24 DIAGNOSIS — E1121 Type 2 diabetes mellitus with diabetic nephropathy: Secondary | ICD-10-CM

## 2018-01-24 DIAGNOSIS — E785 Hyperlipidemia, unspecified: Secondary | ICD-10-CM

## 2018-01-24 DIAGNOSIS — E1169 Type 2 diabetes mellitus with other specified complication: Secondary | ICD-10-CM

## 2018-01-24 DIAGNOSIS — J449 Chronic obstructive pulmonary disease, unspecified: Secondary | ICD-10-CM

## 2018-01-24 MED ORDER — MONTELUKAST SODIUM 10 MG PO TABS
ORAL_TABLET | ORAL | 5 refills | Status: DC
Start: 2018-01-24 — End: 2018-06-04

## 2018-01-24 MED ORDER — METOPROLOL TARTRATE 100 MG PO TABS: 100.0000 mg | ORAL_TABLET | Freq: Two times a day (BID) | ORAL | 1 refills | Status: DC

## 2018-01-24 MED ORDER — BUDESONIDE-FORMOTEROL FUMARATE 160-4.5 MCG/ACT IN AERO: INHALATION_SPRAY | RESPIRATORY_TRACT | 5 refills | Status: DC

## 2018-01-24 MED ORDER — LOSARTAN POTASSIUM 100 MG PO TABS: 100.0000 mg | ORAL_TABLET | Freq: Every day | ORAL | 1 refills | Status: DC

## 2018-01-24 MED ORDER — FUROSEMIDE 40 MG PO TABS
ORAL_TABLET | ORAL | 1 refills | Status: DC
Start: 2018-01-24 — End: 2018-06-04

## 2018-01-24 MED ORDER — SIMVASTATIN 10 MG PO TABS: 10.0000 mg | ORAL_TABLET | Freq: Every day | ORAL | 1 refills | Status: DC

## 2018-01-24 MED ORDER — TIOTROPIUM BROMIDE MONOHYDRATE 18 MCG IN CAPS: 18.0000 ug | ORAL_CAPSULE | Freq: Every day | RESPIRATORY_TRACT | 1 refills | Status: DC

## 2018-01-24 MED ORDER — ALBUTEROL SULFATE HFA 108 (90 BASE) MCG/ACT IN AERS: 2.0000 | INHALATION_SPRAY | RESPIRATORY_TRACT | 3 refills | Status: DC | PRN

## 2018-01-24 MED ORDER — METFORMIN HCL 500 MG PO TABS: 500.0000 mg | ORAL_TABLET | Freq: Every day | ORAL | 1 refills | Status: DC

## 2018-01-24 MED ORDER — CETIRIZINE HCL 10 MG PO TABS: 10.0000 mg | ORAL_TABLET | Freq: Every day | ORAL | 1 refills | Status: DC

## 2018-01-24 NOTE — Addendum Note (Signed)
Addended by: Smitty Cords on: 01/24/2018 05:49 PM   Modules accepted: Orders

## 2018-02-08 DIAGNOSIS — J449 Chronic obstructive pulmonary disease, unspecified: Secondary | ICD-10-CM | POA: Diagnosis not present

## 2018-02-08 DIAGNOSIS — J4540 Moderate persistent asthma, uncomplicated: Secondary | ICD-10-CM | POA: Diagnosis not present

## 2018-03-11 DIAGNOSIS — J4540 Moderate persistent asthma, uncomplicated: Secondary | ICD-10-CM | POA: Diagnosis not present

## 2018-03-11 DIAGNOSIS — J449 Chronic obstructive pulmonary disease, unspecified: Secondary | ICD-10-CM | POA: Diagnosis not present

## 2018-04-10 DIAGNOSIS — J449 Chronic obstructive pulmonary disease, unspecified: Secondary | ICD-10-CM | POA: Diagnosis not present

## 2018-04-10 DIAGNOSIS — J4540 Moderate persistent asthma, uncomplicated: Secondary | ICD-10-CM | POA: Diagnosis not present

## 2018-04-16 ENCOUNTER — Other Ambulatory Visit: Payer: Self-pay

## 2018-04-16 DIAGNOSIS — J449 Chronic obstructive pulmonary disease, unspecified: Secondary | ICD-10-CM

## 2018-04-16 MED ORDER — ALBUTEROL SULFATE HFA 108 (90 BASE) MCG/ACT IN AERS: 2.0000 | INHALATION_SPRAY | RESPIRATORY_TRACT | 0 refills | Status: DC | PRN

## 2018-04-17 ENCOUNTER — Telehealth: Payer: Self-pay | Admitting: Family Medicine

## 2018-04-17 NOTE — Telephone Encounter (Signed)
Called to schedule Medicare Annual Wellness Visit with Nurse Health Advisor. If patient returns call, please schedule AWV with NHA any date  °Thank you! °For any questions please contact: °Kathryn Brown [phone]  °Or Skype me at: [email or web address]  ° ° °

## 2018-05-11 DIAGNOSIS — J4540 Moderate persistent asthma, uncomplicated: Secondary | ICD-10-CM | POA: Diagnosis not present

## 2018-05-11 DIAGNOSIS — J449 Chronic obstructive pulmonary disease, unspecified: Secondary | ICD-10-CM | POA: Diagnosis not present

## 2018-06-04 ENCOUNTER — Other Ambulatory Visit: Payer: Self-pay | Admitting: Family Medicine

## 2018-06-04 ENCOUNTER — Encounter: Payer: Self-pay | Admitting: Family Medicine

## 2018-06-04 ENCOUNTER — Ambulatory Visit
Admission: RE | Admit: 2018-06-04 | Discharge: 2018-06-04 | Disposition: A | Discharge status: Home / Self Care | Payer: Medicare HMO | Source: Ambulatory Visit | Attending: Family Medicine | Admitting: Family Medicine

## 2018-06-04 ENCOUNTER — Other Ambulatory Visit: Payer: Self-pay

## 2018-06-04 ENCOUNTER — Ambulatory Visit (INDEPENDENT_AMBULATORY_CARE_PROVIDER_SITE_OTHER): Payer: Self-pay | Admitting: Family Medicine

## 2018-06-04 VITALS — BP 107/59 | HR 63 | Temp 97.6°F | Ht 66.0 in | Wt 213.4 lb

## 2018-06-04 DIAGNOSIS — E1121 Type 2 diabetes mellitus with diabetic nephropathy: Secondary | ICD-10-CM

## 2018-06-04 DIAGNOSIS — M25562 Pain in left knee: Principal | ICD-10-CM

## 2018-06-04 DIAGNOSIS — M25462 Effusion, left knee: Secondary | ICD-10-CM | POA: Diagnosis not present

## 2018-06-04 DIAGNOSIS — M1732 Unilateral post-traumatic osteoarthritis, left knee: Secondary | ICD-10-CM

## 2018-06-04 DIAGNOSIS — Z23 Encounter for immunization: Secondary | ICD-10-CM

## 2018-06-04 DIAGNOSIS — J449 Chronic obstructive pulmonary disease, unspecified: Secondary | ICD-10-CM

## 2018-06-04 DIAGNOSIS — E669 Obesity, unspecified: Secondary | ICD-10-CM

## 2018-06-04 DIAGNOSIS — J302 Other seasonal allergic rhinitis: Secondary | ICD-10-CM

## 2018-06-04 DIAGNOSIS — N183 Chronic kidney disease, stage 3 unspecified: Secondary | ICD-10-CM

## 2018-06-04 DIAGNOSIS — M1712 Unilateral primary osteoarthritis, left knee: Secondary | ICD-10-CM | POA: Insufficient documentation

## 2018-06-04 DIAGNOSIS — K219 Gastro-esophageal reflux disease without esophagitis: Secondary | ICD-10-CM

## 2018-06-04 DIAGNOSIS — R600 Localized edema: Secondary | ICD-10-CM

## 2018-06-04 DIAGNOSIS — Z0000 Encounter for general adult medical examination without abnormal findings: Secondary | ICD-10-CM

## 2018-06-04 DIAGNOSIS — E1169 Type 2 diabetes mellitus with other specified complication: Secondary | ICD-10-CM

## 2018-06-04 DIAGNOSIS — E785 Hyperlipidemia, unspecified: Secondary | ICD-10-CM

## 2018-06-04 DIAGNOSIS — I10 Essential (primary) hypertension: Secondary | ICD-10-CM

## 2018-06-04 DIAGNOSIS — R635 Abnormal weight gain: Secondary | ICD-10-CM

## 2018-06-04 LAB — POCT GLYCOSYLATED HEMOGLOBIN (HGB A1C): Hemoglobin A1C: 6.7 % — AB (ref 4.0–5.6)

## 2018-06-04 MED ORDER — LOSARTAN POTASSIUM 100 MG PO TABS: 100.0000 mg | ORAL_TABLET | Freq: Every day | ORAL | 3 refills | Status: DC

## 2018-06-04 MED ORDER — SIMVASTATIN 10 MG PO TABS: 10.0000 mg | ORAL_TABLET | Freq: Every day | ORAL | 3 refills | Status: DC

## 2018-06-04 MED ORDER — TIOTROPIUM BROMIDE MONOHYDRATE 18 MCG IN CAPS: 18.0000 ug | ORAL_CAPSULE | Freq: Every day | RESPIRATORY_TRACT | 3 refills | Status: DC

## 2018-06-04 MED ORDER — FLUTICASONE PROPIONATE 50 MCG/ACT NA SUSP: 1.0000 | Freq: Every day | NASAL | 3 refills | Status: DC

## 2018-06-04 MED ORDER — METFORMIN HCL 500 MG PO TABS: 500.0000 mg | ORAL_TABLET | Freq: Every day | ORAL | 3 refills | Status: DC

## 2018-06-04 MED ORDER — FUROSEMIDE 40 MG PO TABS: ORAL_TABLET | ORAL | 3 refills | Status: DC

## 2018-06-04 MED ORDER — CETIRIZINE HCL 10 MG PO TABS: 10.0000 mg | ORAL_TABLET | Freq: Every day | ORAL | 3 refills | Status: DC

## 2018-06-04 MED ORDER — BUDESONIDE-FORMOTEROL FUMARATE 160-4.5 MCG/ACT IN AERO: INHALATION_SPRAY | RESPIRATORY_TRACT | 5 refills | Status: DC

## 2018-06-04 MED ORDER — MONTELUKAST SODIUM 10 MG PO TABS: ORAL_TABLET | ORAL | 3 refills | Status: DC

## 2018-06-04 MED ORDER — METOPROLOL TARTRATE 100 MG PO TABS: 100.0000 mg | ORAL_TABLET | Freq: Two times a day (BID) | ORAL | 3 refills | Status: DC

## 2018-06-04 NOTE — Assessment & Plan Note (Signed)
Controlled HTN at goal - Home BP readings reported normal Complication with CKD-III Failed Amlodipine - edema    Plan:  1. Continue current BP regimen Losartan 100mg  daily, Metoprolol 100mg  BID - May continue Furosemide 20-40mg  BID for edema, may taper 2. Encourage improved lifestyle - low sodium diet, regular exercise 3. Continue monitor BP outside office, bring readings to next visit, if persistently >150/90 or new symptoms notify office sooner 4. Follow-up 6 mo
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
## 2018-06-04 NOTE — Assessment & Plan Note (Signed)
Stable chronic mixed asthma / COPD in patient with environmental allergies but never smoker, but 2nd hand smoke Followed by Pulm ([facility]) Controlled on Symbicort, Spiriva, Albuterol Continue Singulair, Cetirizine Follow-up as needed 

## 2018-06-04 NOTE — Assessment & Plan Note (Signed)
Well-controlled DM with A1c 6.7 from prior 6.8 Complications - CKD-III, peripheral neuropathy, other including hyperlipidemia, GERD, depression, obesity - increases risk of future cardiovascular complications   Plan:  1. Continue current therapy - Metformin 500mg  daily - refilled 2. Encourage improved lifestyle - low carb, low sugar diet, reduce portion size, continue improving regular exercise 3. Continue ASA, ARB, Statin 4. Advised to schedule DM ophtho exam, send record - [place] 5. Follow-up q 6 mo DM A1c

## 2018-06-04 NOTE — Assessment & Plan Note (Signed)
Due for lipid lab Prior mild elevated LDL Refill Simvastatin 10mg  daily Encourage lifestyle improve Follow-up 6 months repeat labs

## 2018-06-04 NOTE — Patient Instructions (Addendum)
Thank you for coming to the office today.  Please schedule and return for a NURSE ONLY VISIT for VACCINE - Approximately around end of September 2019 or early October 2019 - Need High Dose Flu Vaccine  Pneumonia Vaccine pneumovax23 today - done with pneumonia vaccines  Left Knee X-ray - Stay tuned for results - Likely arthritis - Recommend Use RICE therapy: - R - Rest / relative rest with activity modification avoid overuse of joint - I - Ice packs (make sure you use a towel or sock / something to protect skin) - C - Compression with flexible Knee Sleeve or ACE wrap to apply pressure and reduce swelling allowing more support - E - Elevation - if significant swelling, lift leg above heart level (toes above your nose) to help reduce swelling, most helpful at night after day of being on your feet  Your provider would like to you have your annual eye exam. Please contact your current eye doctor or here are some good options for you to contact.    [facility] [address] [place], [place] [address] Phone: [phone] [email or web address]   DUE for FASTING BLOOD WORK (no food or drink after midnight before the lab appointment, only water or coffee without cream/sugar on the morning of)  SCHEDULE "Lab Only" visit in the morning at the clinic for lab draw in 6 MONTHS   - Make sure Lab Only appointment is at about 1 week before your next appointment, so that results will be available  For Lab Results, once available within 2-3 days of blood draw, you can can log in to MyChart online to view your results and a brief explanation. Also, we can discuss results at next follow-up visit.   Please schedule a Follow-up Appointment to: Return in about 6 months (around 12/03/2018) for Annual Physical.  If you have any other questions or concerns, please feel free to call the office or send a message through MyChart. You may also schedule an earlier appointment if  necessary.  Additionally, you may be receiving a survey about your experience at our office within a few days to 1 week by e-mail or mail. We value your feedback.  Saralyn Pilar, DO [facility], [place]
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
## 2018-06-04 NOTE — Assessment & Plan Note (Signed)
Stable CKD, secondary to likely HTN, DM, Age  Plan: 1. Continue to control BP, A1c 2. Improve hydration 3. Remain off oral NSAIDs 4. Follow-up 6 months - future can taper down Lasix further

## 2018-06-05 ENCOUNTER — Other Ambulatory Visit: Payer: Self-pay | Admitting: Family Medicine

## 2018-06-05 ENCOUNTER — Ambulatory Visit: Payer: Self-pay | Admitting: Family Medicine

## 2018-06-05 DIAGNOSIS — M1732 Unilateral post-traumatic osteoarthritis, left knee: Secondary | ICD-10-CM

## 2018-06-05 MED ORDER — DICLOFENAC SODIUM 1 % TD GEL: 2.0000 g | Freq: Three times a day (TID) | TRANSDERMAL | 2 refills | Status: DC | PRN

## 2018-06-11 DIAGNOSIS — J4540 Moderate persistent asthma, uncomplicated: Secondary | ICD-10-CM | POA: Diagnosis not present

## 2018-06-11 DIAGNOSIS — J449 Chronic obstructive pulmonary disease, unspecified: Secondary | ICD-10-CM | POA: Diagnosis not present

## 2018-06-13 ENCOUNTER — Telehealth: Payer: Self-pay | Admitting: Family Medicine

## 2018-06-13 NOTE — Telephone Encounter (Signed)
Pt said she needs refills sent to mail order pharmacy.  Her call back number is [phone]

## 2018-06-14 ENCOUNTER — Other Ambulatory Visit: Payer: Self-pay | Admitting: Family Medicine

## 2018-06-14 DIAGNOSIS — J449 Chronic obstructive pulmonary disease, unspecified: Secondary | ICD-10-CM

## 2018-06-14 MED ORDER — ALBUTEROL SULFATE HFA 108 (90 BASE) MCG/ACT IN AERS: 2.0000 | INHALATION_SPRAY | RESPIRATORY_TRACT | 3 refills | Status: DC | PRN

## 2018-06-14 MED ORDER — IPRATROPIUM-ALBUTEROL 0.5-2.5 (3) MG/3ML IN SOLN: RESPIRATORY_TRACT | 12 refills | Status: DC

## 2018-06-14 NOTE — Telephone Encounter (Signed)
I just checked on her med list. It looks like most rx were all sent to her Mail Order [facility] on 06/04/18 for 90 day supply.  I am not sure what next step is. Either notify patient that they were sent on 06/04/18 and she can call [facility] to check status. OR if you could call the mail order pharmacy to confirm that they received these?  Thanks  Saralyn PilarAlexander Karamalegos, DO [facility] 06/14/2018, 5:06 PM

## 2018-06-14 NOTE — Telephone Encounter (Signed)
[facility] on ventolin and ipratropium.  Pt is going out of town.  Her call back number is [phone]

## 2018-06-15 ENCOUNTER — Other Ambulatory Visit: Payer: Self-pay | Admitting: Family Medicine

## 2018-06-15 DIAGNOSIS — J449 Chronic obstructive pulmonary disease, unspecified: Secondary | ICD-10-CM

## 2018-06-15 MED ORDER — ALBUTEROL SULFATE HFA 108 (90 BASE) MCG/ACT IN AERS: 2.0000 | INHALATION_SPRAY | RESPIRATORY_TRACT | 3 refills | Status: DC | PRN

## 2018-06-15 MED ORDER — IPRATROPIUM-ALBUTEROL 0.5-2.5 (3) MG/3ML IN SOLN: RESPIRATORY_TRACT | 12 refills | Status: DC

## 2018-07-09 DIAGNOSIS — J301 Allergic rhinitis due to pollen: Secondary | ICD-10-CM | POA: Diagnosis not present

## 2018-07-09 DIAGNOSIS — J449 Chronic obstructive pulmonary disease, unspecified: Secondary | ICD-10-CM | POA: Diagnosis not present

## 2018-07-09 DIAGNOSIS — Z23 Encounter for immunization: Secondary | ICD-10-CM | POA: Diagnosis not present

## 2018-07-11 DIAGNOSIS — J449 Chronic obstructive pulmonary disease, unspecified: Secondary | ICD-10-CM | POA: Diagnosis not present

## 2018-07-11 DIAGNOSIS — J4540 Moderate persistent asthma, uncomplicated: Secondary | ICD-10-CM | POA: Diagnosis not present

## 2018-07-20 ENCOUNTER — Telehealth: Payer: Self-pay | Admitting: Family Medicine

## 2018-07-20 NOTE — Telephone Encounter (Signed)
Try calling patient no VM set up phone was ringing and getting disconnected.

## 2018-07-20 NOTE — Telephone Encounter (Signed)
Since unable to reach patient by phone. I have limited options for her. New medical complaint should follow-up in future in office or as needed.  We can call her back next week and attempt to reach her, let her know that anti-histamine eye drops (several different brands) are good OTC for eye itching. She may ask pharmacist which one.  If eye pain, redness, loss of vision or new concerns, needs to be evaluated sooner.  Saralyn Pilar, DO [facility] 07/20/2018, 5:53 PM

## 2018-07-20 NOTE — Telephone Encounter (Signed)
Pt's eyes are really itching.  She asked what she could take [phone]

## 2018-07-23 NOTE — Telephone Encounter (Signed)
Unable to reach patient no VM set up

## 2018-08-11 DIAGNOSIS — J4540 Moderate persistent asthma, uncomplicated: Secondary | ICD-10-CM | POA: Diagnosis not present

## 2018-08-11 DIAGNOSIS — J449 Chronic obstructive pulmonary disease, unspecified: Secondary | ICD-10-CM | POA: Diagnosis not present

## 2018-10-22 ENCOUNTER — Other Ambulatory Visit: Payer: Self-pay | Admitting: Family Medicine

## 2018-10-22 DIAGNOSIS — M1732 Unilateral post-traumatic osteoarthritis, left knee: Secondary | ICD-10-CM

## 2018-11-02 ENCOUNTER — Other Ambulatory Visit: Payer: Self-pay | Admitting: Family Medicine

## 2018-11-02 DIAGNOSIS — R600 Localized edema: Secondary | ICD-10-CM

## 2018-11-06 ENCOUNTER — Telehealth: Payer: Self-pay | Admitting: Family Medicine

## 2018-11-06 NOTE — Telephone Encounter (Signed)
Called patient to see if we can reschedule her Medicare Annual Wellness Visit on November 27, 2018 at 8:50 AM to November 27, 2018 at 9:15 AM.    If patient returns call,  please schedule AWV with [facility] for November 27, 2018 at 9:15 AM.  There isn't a slot for 9:30 AM built in the templete.  Please select the original time and click change appointment.  Then change to 9:15 AM.  Note:  Patient has a Lab appointment that same day, 11/27/2018 at 8:15 AM.    Thank you! For any questions please contact: Trixie Rude at [phone] or Skype [email or web address]

## 2018-11-07 NOTE — Telephone Encounter (Signed)
Spoke with patient via home phone and discussed the time change with her Medicare Annual Wellness Visit on 11/27/2018.  Patient was fine with AWV appointment being changed from 8:50 AM to 9:15 AM.  Patient voiced understood.  Trixie Rude, Care Guide.

## 2018-11-26 ENCOUNTER — Other Ambulatory Visit: Payer: Self-pay

## 2018-11-27 ENCOUNTER — Ambulatory Visit (INDEPENDENT_AMBULATORY_CARE_PROVIDER_SITE_OTHER): Payer: Medicare HMO | Admitting: Family Medicine

## 2018-11-27 ENCOUNTER — Other Ambulatory Visit: Payer: Medicare HMO

## 2018-11-27 ENCOUNTER — Other Ambulatory Visit: Payer: Self-pay

## 2018-11-27 ENCOUNTER — Other Ambulatory Visit: Payer: Self-pay | Admitting: Family Medicine

## 2018-11-27 ENCOUNTER — Encounter: Payer: Self-pay | Admitting: Family Medicine

## 2018-11-27 ENCOUNTER — Ambulatory Visit (INDEPENDENT_AMBULATORY_CARE_PROVIDER_SITE_OTHER): Payer: Medicare HMO

## 2018-11-27 VITALS — BP 144/86 | HR 74 | Temp 98.3°F | Resp 16 | Ht 66.0 in | Wt 215.6 lb

## 2018-11-27 VITALS — BP 162/90 | HR 74 | Temp 98.3°F | Resp 15 | Ht 66.0 in | Wt 215.6 lb

## 2018-11-27 DIAGNOSIS — Z0000 Encounter for general adult medical examination without abnormal findings: Secondary | ICD-10-CM

## 2018-11-27 DIAGNOSIS — E1169 Type 2 diabetes mellitus with other specified complication: Secondary | ICD-10-CM

## 2018-11-27 DIAGNOSIS — K219 Gastro-esophageal reflux disease without esophagitis: Secondary | ICD-10-CM | POA: Diagnosis not present

## 2018-11-27 DIAGNOSIS — R600 Localized edema: Secondary | ICD-10-CM

## 2018-11-27 DIAGNOSIS — I10 Essential (primary) hypertension: Secondary | ICD-10-CM

## 2018-11-27 DIAGNOSIS — E1121 Type 2 diabetes mellitus with diabetic nephropathy: Secondary | ICD-10-CM | POA: Diagnosis not present

## 2018-11-27 DIAGNOSIS — N183 Chronic kidney disease, stage 3 unspecified: Secondary | ICD-10-CM

## 2018-11-27 DIAGNOSIS — E785 Hyperlipidemia, unspecified: Principal | ICD-10-CM

## 2018-11-27 DIAGNOSIS — M1732 Unilateral post-traumatic osteoarthritis, left knee: Secondary | ICD-10-CM

## 2018-11-27 DIAGNOSIS — R635 Abnormal weight gain: Secondary | ICD-10-CM | POA: Diagnosis not present
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
## 2018-11-27 NOTE — Patient Instructions (Signed)
Sharon Henderson , Thank you for taking time to come for your Medicare Wellness Visit. I appreciate your ongoing commitment to your health goals. Please review the following plan we discussed and let me know if I can assist you in the future.   Screening recommendations/referrals: Colonoscopy: no longer required Mammogram: no longer required Bone Density: no longer required Recommended yearly ophthalmology/optometry visit for glaucoma screening and checkup Recommended yearly dental visit for hygiene and checkup  Vaccinations: Influenza vaccine: up to date  Pneumococcal vaccine: up to date Tdap vaccine: up to date Shingles vaccine: shingrix eligible, check with your insurance company for coverage     Advanced directives: Please bring a copy of your health care power of attorney and living will to the office at your convenience.  Conditions/risks identified: diabetic. Continue the good work with your nutrition.   Next appointment: Follow up in one year for your medicare wellness visit.    Preventive Care 3 Years and Older, Female Preventive care refers to lifestyle choices and visits with your health care provider that can promote health and wellness. What does preventive care include?  A yearly physical exam. This is also called an annual well check.  Dental exams once or twice a year.  Routine eye exams. Ask your health care provider how often you should have your eyes checked.  Personal lifestyle choices, including:  Daily care of your teeth and gums.  Regular physical activity.  Eating a healthy diet.  Avoiding tobacco and drug use.  Limiting alcohol use.  Practicing safe sex.  Taking low-dose aspirin every day.  Taking vitamin and mineral supplements as recommended by your health care provider. What happens during an annual well check? The services and screenings done by your health care provider during your annual well check will depend on your age, overall health,  lifestyle risk factors, and family history of disease. Counseling  Your health care provider may ask you questions about your:  Alcohol use.  Tobacco use.  Drug use.  Emotional well-being.  Home and relationship well-being.  Sexual activity.  Eating habits.  History of falls.  Memory and ability to understand (cognition).  Work and work Astronomer.  Reproductive health. Screening  You may have the following tests or measurements:  Height, weight, and BMI.  Blood pressure.  Lipid and cholesterol levels. These may be checked every 5 years, or more frequently if you are over 63 years old.  Skin check.  Lung cancer screening. You may have this screening every year starting at age 57 if you have a 30-pack-year history of smoking and currently smoke or have quit within the past 15 years.  Fecal occult blood test (FOBT) of the stool. You may have this test every year starting at age 5.  Flexible sigmoidoscopy or colonoscopy. You may have a sigmoidoscopy every 5 years or a colonoscopy every 10 years starting at age 74.  Hepatitis C blood test.  Hepatitis B blood test.  Sexually transmitted disease (STD) testing.  Diabetes screening. This is done by checking your blood sugar (glucose) after you have not eaten for a while (fasting). You may have this done every 1-3 years.  Bone density scan. This is done to screen for osteoporosis. You may have this done starting at age 7.  Mammogram. This may be done every 1-2 years. Talk to your health care provider about how often you should have regular mammograms. Talk with your health care provider about your test results, treatment options, and if necessary, the  need for more tests. Vaccines  Your health care provider may recommend certain vaccines, such as:  Influenza vaccine. This is recommended every year.  Tetanus, diphtheria, and acellular pertussis (Tdap, Td) vaccine. You may need a Td booster every 10 years.  Zoster  vaccine. You may need this after age 68.  Pneumococcal 13-valent conjugate (PCV13) vaccine. One dose is recommended after age 39.  Pneumococcal polysaccharide (PPSV23) vaccine. One dose is recommended after age 74. Talk to your health care provider about which screenings and vaccines you need and how often you need them. This information is not intended to replace advice given to you by your health care provider. Make sure you discuss any questions you have with your health care provider. Document Released: 10/09/2015 Document Revised: 06/01/2016 Document Reviewed: 07/14/2015 Elsevier Interactive Patient Education  2017 [place] Prevention in the Home Falls can cause injuries. They can happen to people of all ages. There are many things you can do to make your home safe and to help prevent falls. What can I do on the outside of my home?  Regularly fix the edges of walkways and driveways and fix any cracks.  Remove anything that might make you trip as you walk through a door, such as a raised step or threshold.  Trim any bushes or trees on the path to your home.  Use bright outdoor lighting.  Clear any walking paths of anything that might make someone trip, such as rocks or tools.  Regularly check to see if handrails are loose or broken. Make sure that both sides of any steps have handrails.  Any raised decks and porches should have guardrails on the edges.  Have any leaves, snow, or ice cleared regularly.  Use sand or salt on walking paths during winter.  Clean up any spills in your garage right away. This includes oil or grease spills. What can I do in the bathroom?  Use night lights.  Install grab bars by the toilet and in the tub and shower. Do not use towel bars as grab bars.  Use non-skid mats or decals in the tub or shower.  If you need to sit down in the shower, use a plastic, non-slip stool.  Keep the floor dry. Clean up any water that spills on the  floor as soon as it happens.  Remove soap buildup in the tub or shower regularly.  Attach bath mats securely with double-sided non-slip rug tape.  Do not have throw rugs and other things on the floor that can make you trip. What can I do in the bedroom?  Use night lights.  Make sure that you have a light by your bed that is easy to reach.  Do not use any sheets or blankets that are too big for your bed. They should not hang down onto the floor.  Have a firm chair that has side arms. You can use this for support while you get dressed.  Do not have throw rugs and other things on the floor that can make you trip. What can I do in the kitchen?  Clean up any spills right away.  Avoid walking on wet floors.  Keep items that you use a lot in easy-to-reach places.  If you need to reach something above you, use a strong step stool that has a grab bar.  Keep electrical cords out of the way.  Do not use floor polish or wax that makes floors slippery. If you must use wax,  use non-skid floor wax.  Do not have throw rugs and other things on the floor that can make you trip. What can I do with my stairs?  Do not leave any items on the stairs.  Make sure that there are handrails on both sides of the stairs and use them. Fix handrails that are broken or loose. Make sure that handrails are as long as the stairways.  Check any carpeting to make sure that it is firmly attached to the stairs. Fix any carpet that is loose or worn.  Avoid having throw rugs at the top or bottom of the stairs. If you do have throw rugs, attach them to the floor with carpet tape.  Make sure that you have a light switch at the top of the stairs and the bottom of the stairs. If you do not have them, ask someone to add them for you. What else can I do to help prevent falls?  Wear shoes that:  Do not have high heels.  Have rubber bottoms.  Are comfortable and fit you well.  Are closed at the toe. Do not wear  sandals.  If you use a stepladder:  Make sure that it is fully opened. Do not climb a closed stepladder.  Make sure that both sides of the stepladder are locked into place.  Ask someone to hold it for you, if possible.  Clearly mark and make sure that you can see:  Any grab bars or handrails.  First and last steps.  Where the edge of each step is.  Use tools that help you move around (mobility aids) if they are needed. These include:  Canes.  Walkers.  Scooters.  Crutches.  Turn on the lights when you go into a dark area. Replace any light bulbs as soon as they burn out.  Set up your furniture so you have a clear path. Avoid moving your furniture around.  If any of your floors are uneven, fix them.  If there are any pets around you, be aware of where they are.  Review your medicines with your doctor. Some medicines can make you feel dizzy. This can increase your chance of falling. Ask your doctor what other things that you can do to help prevent falls. This information is not intended to replace advice given to you by your health care provider. Make sure you discuss any questions you have with your health care provider. Document Released: 07/09/2009 Document Revised: 02/18/2016 Document Reviewed: 10/17/2014 Elsevier Interactive Patient Education  2017 [employer].

## 2018-11-27 NOTE — Progress Notes (Signed)
Patient was seen today for her Medicare Wellness visit. Her BP was checked with a manual cuff at start of visit, reading was 162/90. It was discovered later on after her visit was over that the BP cuff was reading inaccurately high. The provider was made aware at time of discovery. A safety zone incident was made as well. BP upon leaving the office was 144/86 and checked with a different BP cuff.

## 2018-11-27 NOTE — Progress Notes (Signed)
Subjective:   Sharon Henderson is a 78 y.o. female who presents for Medicare Annual (Subsequent) preventive examination.  Review of Systems:   Cardiac Risk Factors include: advanced age (>34men, >69 women);hypertension;diabetes mellitus;dyslipidemia;obesity (BMI >30kg/m2)     Objective:     Vitals: BP (!) 162/90 (BP Location: Right Arm, Patient Position: Sitting, Cuff Size: Normal)   Pulse 74   Temp 98.3 F (36.8 C) (Oral)   Resp 15   Ht  (1.676 m)   Wt 215 lb 9.6 oz (97.8 kg)   BMI 34.80 kg/m   Body mass index is 34.8 kg/m.  Advanced Directives 11/27/2018 07/17/2017 04/04/2017 01/04/2016 10/12/2015  Does Patient Have a Medical Advance Directive? Yes Yes No No No  Type of Estate agent of [employer] Power of [place];Living will - - -  Does patient want to make changes to medical advance directive? - No - Patient declined - - -  Would patient like information on creating a medical advance directive? - - Yes (MAU/Ambulatory/Procedural Areas - Information given) No - patient declined information Yes English as a second language teacher given    Tobacco Social History   Tobacco Use  Smoking Status Never Smoker  Smokeless Tobacco Never Used     Counseling given: Not Answered   Clinical Intake:  Pre-visit preparation completed: Yes  Pain : 0-10 Pain Score: 0-No pain Pain Type: Chronic pain Pain Location: Knee Pain Orientation: Left Pain Descriptors / Indicators: Sore, Tightness Pain Onset: More than a month ago Pain Frequency: Intermittent Pain Relieving Factors: sitting   Pain Relieving Factors: sitting   Nutrition Risk Assessment:  Has the patient had any N/V/D within the last 2 months?  No  Does the patient have any non-healing wounds?  No  Has the patient had any unintentional weight loss or weight gain?  No   Diabetes:  Is the patient diabetic?  Yes  If diabetic, was a CBG obtained today?  No  Did the patient bring in their  glucometer from home?  No  How often do you monitor your CBG's? Doesn't check at home .   Financial Strains and Diabetes Management:  Are you having any financial strains with the device, your supplies or your medication? No .  Does the patient want to be seen by Chronic Care Management for management of their diabetes?  No  Would the patient like to be referred to a Nutritionist or for Diabetic Management?  No   Diabetic Exams:  Diabetic Eye Exam:  Overdue for diabetic eye exam. Pt has been advised about the importance in completing this exam. She will call and schedule an appt with [facility] and have them fax results when completed   Diabetic Foot Exam: Completed 06/04/2018. Pt has been advised about the importance in completing this exam.    How often do you need to have someone help you when you read instructions, pamphlets, or other written materials from your doctor or pharmacy?: 1 - Never What is the last grade level you completed in school?: 9th grade  Interpreter Needed?: No  Information entered by :: Tiffany Hill,LPN   Past Medical History:  Diagnosis Date  . Allergic rhinitis   . Asthma   . Diabetes mellitus without complication (HCC)   . Diverticulosis   . Hyperlipidemia   . Hypertension   . Internal hemorrhoids    Past Surgical History:  Procedure Laterality Date  . CATARACT EXTRACTION    . COLONOSCOPY  Family History  Problem Relation Age of Onset  . Asthma Mother   . Heart disease Mother   . Heart attack Father    Social History   Socioeconomic History  . Marital status: Widowed    Spouse name: Not on file  . Number of children: Not on file  . Years of education: Not on file  . Highest education level: 9th grade  Occupational History  . Not on file  Social Needs  . Financial resource strain: Not hard at all  . Food insecurity:    Worry: Never true    Inability: Never true  . Transportation needs:    Medical: No    Non-medical: No    Tobacco Use  . Smoking status: Never Smoker  . Smokeless tobacco: Never Used  Substance and Sexual Activity  . Alcohol use: No    Alcohol/week: 0.0 standard drinks  . Drug use: No  . Sexual activity: Not on file  Lifestyle  . Physical activity:    Days per week: 0 days    Minutes per session: 0 min  . Stress: Not at all  Relationships  . Social connections:    Talks on phone: More than three times a week    Gets together: More than three times a week    Attends religious service: More than 4 times per year    Active member of club or organization: Yes    Attends meetings of clubs or organizations: More than 4 times per year    Relationship status: Widowed  Other Topics Concern  . Not on file  Social History Narrative   Lives in retirement home, family comes to see her often, she goes to church and participates in activities     Outpatient Encounter Medications as of 11/27/2018  Medication Sig  . acetaminophen (TYLENOL) 500 MG tablet Take 1,000 mg by mouth every 8 (eight) hours as needed.  [name] albuterol (PROVENTIL HFA;VENTOLIN HFA) 108 (90 Base) MCG/ACT inhaler Inhale 2 puffs into the lungs every 4 (four) hours as needed for wheezing or shortness of breath (cough).  [name] aspirin EC 81 MG tablet Take by mouth.  . budesonide-formoterol (SYMBICORT) 160-4.5 MCG/ACT inhaler INHALE 2 INHALATIONS INTO THE LUNGS 2 (TWO) TIMES DAILY.  . cetirizine (ZYRTEC) 10 MG tablet Take 1 tablet (10 mg total) by mouth daily.  . diclofenac sodium (VOLTAREN) 1 % GEL apply 2g topically THREE TIMES DAILY AS NEEDED FOR LEFT knee arthritis  . fluticasone (FLONASE) 50 MCG/ACT nasal spray Place 1-2 sprays into both nostrils daily.  . furosemide (LASIX) 40 MG tablet TAKE ONE TABLET BY MOUTH TWICE DAILY. If worsening swelling, may take 1 additional tab in afternoon.  [name] ipratropium-albuterol (DUONEB) 0.5-2.5 (3) MG/3ML SOLN Take 3 mLs by nebulization 4 (four) times daily.  [name] losartan (COZAAR) 100 MG tablet Take 1  tablet (100 mg total) by mouth daily.  . metFORMIN (GLUCOPHAGE) 500 MG tablet Take 1 tablet (500 mg total) by mouth daily with breakfast.  . metoprolol tartrate (LOPRESSOR) 100 MG tablet Take 1 tablet (100 mg total) by mouth 2 (two) times daily.  . montelukast (SINGULAIR) 10 MG tablet TAKE 1 TABLET (10 MG TOTAL) BY MOUTH NIGHTLY. FOR ALLERGIES  . simvastatin (ZOCOR) 10 MG tablet Take 1 tablet (10 mg total) by mouth at bedtime.  [name] tiotropium (SPIRIVA) 18 MCG inhalation capsule Place 1 capsule (18 mcg total) into inhaler and inhale daily.   No facility-administered encounter medications on file as of 11/27/2018.  Activities of Daily Living In your present state of health, do you have any difficulty performing the following activities: 11/27/2018  Hearing? Y  Vision? Y  Difficulty concentrating or making decisions? N  Walking or climbing stairs? Y  Comment knee pain   Dressing or bathing? N  Doing errands, shopping? Y  Comment family assists with Facilities manager and eating ? N  Using the Toilet? N  In the past six months, have you accidently leaked urine? N  Do you have problems with loss of bowel control? N  Managing your Medications? N  Managing your Finances? N  Housekeeping or managing your Housekeeping? N  Some recent data might be hidden    Patient Care Team: Smitty Cords, DO as PCP - General (Family Medicine)    Assessment:   This is a routine wellness examination for [place].  Exercise Activities and Dietary recommendations Current Exercise Habits: The patient does not participate in regular exercise at present, Exercise limited by: None identified  Goals    . Increase water intake     Recommend drinking at least 4-5 glasses of water a day        Fall Risk Fall Risk  11/27/2018 07/17/2017 04/04/2017 04/18/2016 01/14/2016  Falls in the past year? 0 No No No No   FALL RISK PREVENTION PERTAINING TO THE HOME:  Any stairs in or around the  home? No  If so, are there any without handrails? n/a  Home free of loose throw rugs in walkways, pet beds, electrical cords, etc? Yes  Adequate lighting in your home to reduce risk of falls? Yes   ASSISTIVE DEVICES UTILIZED TO PREVENT FALLS:  Life alert? No  Use of a cane, walker or w/c? No  Grab bars in the bathroom? Yes  Shower chair or bench in shower? Yes  Elevated toilet seat or a handicapped toilet? Yes   DME ORDERS:  DME order needed?  No   TIMED UP AND GO:  Was the test performed? No .  Length of time to ambulate 10 feet: 11 sec.   GAIT:  Appearance of gait: Gait steady without the use of an assistive device.  Education: Fall risk prevention has been discussed.  Intervention(s) required? No    Depression Screen PHQ 2/9 Scores 11/27/2018 07/17/2017 04/04/2017 04/18/2016  PHQ - 2 Score 0 0 5 0  PHQ- 9 Score - 3 7 -     Cognitive Function     6CIT Screen 11/27/2018 04/04/2017  What Year? 0 points 0 points  What month? 0 points 0 points  What time? 0 points 0 points  Count back from 20 0 points 0 points  Months in reverse 0 points 0 points  Repeat phrase 0 points 0 points  Total Score 0 0    Immunization History  Administered Date(s) Administered  . Influenza, High Dose Seasonal PF 08/14/2015, 10/06/2016, 07/27/2017, 07/09/2018  . Pneumococcal Conjugate-13 01/17/2017  . Pneumococcal Polysaccharide-23 06/04/2018  . Tdap 01/17/2017    Qualifies for Shingles Vaccine?Yes  Zostavax completed n/a. Due for Shingrix. Education has been provided regarding the importance of this vaccine. Pt has been advised to call insurance company to determine out of pocket expense. Advised may also receive vaccine at local pharmacy or Health Dept. Verbalized acceptance and understanding.  Tdap: up to date  Flu Vaccine: up to date  Pneumococcal Vaccine:up to date  Screening Tests Health Maintenance  Topic Date Due  . OPHTHALMOLOGY EXAM  06/26/2016  . INFLUENZA  VACCINE   04/26/2018  . DEXA SCAN  06/05/2019 (Originally 07/28/2004)  . HEMOGLOBIN A1C  12/03/2018  . FOOT EXAM  06/05/2019  . TETANUS/TDAP  01/18/2027  . PNA vac Low Risk Adult  Completed   Cancer Screenings:  Colorectal Screening:no longer required  Mammogram: no longer required  Bone Density: no longer required  Lung Cancer Screening: (Low Dose CT Chest recommended if Age 38-80 years, 30 pack-year currently smoking OR have quit w/in 15years.) does not qualify.     Additional Screening:  Hepatitis C Screening: does not qualify  Vision Screening: Recommended annual ophthalmology exams for early detection of glaucoma and other disorders of the eye. Is the patient up to date with their annual eye exam?  Yes  Who is the provider or what is the name of the office in which the pt attends annual eye exams? [facility]    Dental Screening: Recommended annual dental exams for proper oral hygiene  Community Resource Referral:  CRR required this visit?  No      Plan:    I have personally reviewed and addressed the Medicare Annual Wellness questionnaire and have noted the following in the patient's chart:  A. Medical and social history B. Use of alcohol, tobacco or illicit drugs  C. Current medications and supplements D. Functional ability and status E.  Nutritional status F.  Physical activity G. Advance directives H. List of other physicians I.  Hospitalizations, surgeries, and ER visits in previous 12 months J.  Vitals K. Screenings such as hearing and vision if needed, cognitive and depression L. Referrals and appointments   In addition, I have reviewed and discussed with patient certain preventive protocols, quality metrics, and best practice recommendations. A written personalized care plan for preventive services as well as general preventive health recommendations were provided to patient.   Signed,  Marin Roberts, LPN Nurse Health Advisor   Nurse  Notes:none

## 2018-11-27 NOTE — Patient Instructions (Addendum)
Thank you for coming to the office today.  If you want the shot next time - we can add it on, but it would be extra charge, as it is not covered by physical, when you are ready.  Consider Knee Sleeve - stretchy pull on, to provide compression and support.  OR can try ACE wrap  Otherwise if not strong enough - you can try a Knee Brace which is more rigid support  [facility] [address] [place], [place] [address] Phone: [phone]  [facility] [address] [place], [place] [address] Ph: [phone]   Please schedule a Follow-up Appointment to: Return for keep apt 3/9 as scheduled.  If you have any other questions or concerns, please feel free to call the office or send a message through MyChart. You may also schedule an earlier appointment if necessary.  Additionally, you may be receiving a survey about your experience at our office within a few days to 1 week by e-mail or mail. We value your feedback.  Saralyn Pilar, DO [facility], [place]

## 2018-11-28 LAB — CBC WITH DIFFERENTIAL/PLATELET
Absolute Monocytes: 374 cells/uL (ref 200–950)
Basophils Absolute: 43 cells/uL (ref 0–200)
Basophils Relative: 0.6 %
Eosinophils Absolute: 259 cells/uL (ref 15–500)
Eosinophils Relative: 3.6 %
HCT: 37.1 % (ref 35.0–45.0)
Hemoglobin: 12.2 g/dL (ref 11.7–15.5)
Lymphs Abs: 1411 cells/uL (ref 850–3900)
MCH: 30.7 pg (ref 27.0–33.0)
MCHC: 32.9 g/dL (ref 32.0–36.0)
MCV: 93.2 fL (ref 80.0–100.0)
MPV: 11.9 fL (ref 7.5–12.5)
Monocytes Relative: 5.2 %
Neutro Abs: 5112 cells/uL (ref 1500–7800)
Neutrophils Relative %: 71 %
Platelets: 174 10*3/uL (ref 140–400)
RBC: 3.98 10*6/uL (ref 3.80–5.10)
RDW: 12.7 % (ref 11.0–15.0)
Total Lymphocyte: 19.6 %
WBC: 7.2 10*3/uL (ref 3.8–10.8)

## 2018-11-28 LAB — LIPID PANEL
CHOL/HDL RATIO: 3.4 (calc) (ref <5.0)
CHOLESTEROL: 151 mg/dL (ref <200)
HDL: 45 mg/dL — AB (ref >=50)
LDL CHOLESTEROL (CALC): 86 mg/dL
Non-HDL Cholesterol (Calc): 106 mg/dL (calc) (ref <130)
Triglycerides: 103 mg/dL (ref <150)

## 2018-11-28 LAB — COMPLETE METABOLIC PANEL WITH GFR
AG RATIO: 1.6 (calc) (ref 1.0–2.5)
ALBUMIN MSPROF: 3.9 g/dL (ref 3.6–5.1)
ALKALINE PHOSPHATASE (APISO): 72 U/L (ref 37–153)
ALT: 6 U/L (ref 6–29)
AST: 11 U/L (ref 10–35)
BUN / CREAT RATIO: 15 (calc) (ref 6–22)
BUN: 22 mg/dL (ref 7–25)
CO2: 27 mmol/L (ref 20–32)
Calcium: 9.3 mg/dL (ref 8.6–10.4)
Chloride: 106 mmol/L (ref 98–110)
Creat: 1.45 mg/dL — ABNORMAL HIGH (ref 0.60–0.93)
GFR, EST NON AFRICAN AMERICAN: 34 mL/min/{1.73_m2} — AB (ref >=60)
GFR, Est African American: 40 mL/min/{1.73_m2} — ABNORMAL LOW (ref >=60)
GLOBULIN: 2.5 g/dL (ref 1.9–3.7)
Glucose, Bld: 143 mg/dL — ABNORMAL HIGH (ref 65–99)
POTASSIUM: 4.5 mmol/L (ref 3.5–5.3)
SODIUM: 143 mmol/L (ref 135–146)
Total Bilirubin: 0.4 mg/dL (ref 0.2–1.2)
Total Protein: 6.4 g/dL (ref 6.1–8.1)

## 2018-11-28 LAB — HEMOGLOBIN A1C
Hgb A1c MFr Bld: 6.8 % of total Hgb — ABNORMAL HIGH (ref <5.7)
Mean Plasma Glucose: 148 (calc)
eAG (mmol/L): 8.2 (calc)

## 2018-11-28 LAB — TSH: TSH: 1.42 mIU/L (ref 0.40–4.50)

## 2018-11-28 LAB — T4, FREE: Free T4: 1.2 ng/dL (ref 0.8–1.8)

## 2018-12-03 ENCOUNTER — Other Ambulatory Visit: Payer: Self-pay

## 2018-12-03 ENCOUNTER — Ambulatory Visit (INDEPENDENT_AMBULATORY_CARE_PROVIDER_SITE_OTHER): Payer: Medicare HMO | Admitting: Family Medicine

## 2018-12-03 ENCOUNTER — Encounter: Payer: Self-pay | Admitting: Family Medicine

## 2018-12-03 VITALS — BP 119/66 | HR 68 | Temp 98.4°F | Resp 16 | Ht 66.0 in | Wt 216.4 lb

## 2018-12-03 DIAGNOSIS — E1169 Type 2 diabetes mellitus with other specified complication: Secondary | ICD-10-CM | POA: Diagnosis not present

## 2018-12-03 DIAGNOSIS — N183 Chronic kidney disease, stage 3 unspecified: Secondary | ICD-10-CM

## 2018-12-03 DIAGNOSIS — I129 Hypertensive chronic kidney disease with stage 1 through stage 4 chronic kidney disease, or unspecified chronic kidney disease: Secondary | ICD-10-CM

## 2018-12-03 DIAGNOSIS — G8929 Other chronic pain: Secondary | ICD-10-CM

## 2018-12-03 DIAGNOSIS — M1732 Unilateral post-traumatic osteoarthritis, left knee: Secondary | ICD-10-CM

## 2018-12-03 DIAGNOSIS — R600 Localized edema: Secondary | ICD-10-CM | POA: Diagnosis not present

## 2018-12-03 DIAGNOSIS — M25562 Pain in left knee: Secondary | ICD-10-CM | POA: Diagnosis not present

## 2018-12-03 DIAGNOSIS — Z0000 Encounter for general adult medical examination without abnormal findings: Secondary | ICD-10-CM | POA: Diagnosis not present

## 2018-12-03 DIAGNOSIS — E785 Hyperlipidemia, unspecified: Secondary | ICD-10-CM

## 2018-12-03 DIAGNOSIS — J449 Chronic obstructive pulmonary disease, unspecified: Secondary | ICD-10-CM

## 2018-12-03 DIAGNOSIS — E1121 Type 2 diabetes mellitus with diabetic nephropathy: Secondary | ICD-10-CM | POA: Diagnosis not present

## 2018-12-03 MED ORDER — LIDOCAINE HCL (PF) 1 % IJ SOLN
4.0000 mL | Freq: Once | INTRAMUSCULAR | Status: AC
  Administered 2018-12-03: 4 mL

## 2018-12-03 MED ORDER — TRIAMCINOLONE ACETONIDE 40 MG/ML IJ SUSP
40.0000 mg | Freq: Once | INTRAMUSCULAR | Status: AC
  Administered 2018-12-03: 40 mg via INTRA_ARTICULAR

## 2018-12-03 NOTE — Assessment & Plan Note (Signed)
Stable CKD, secondary to likely HTN, DM, Age  Plan: 1. Continue to control BP, A1c 2. Improve hydration 3. Remain off oral NSAIDs 4. Follow-up 3-6 months - future can taper down Lasix further

## 2018-12-03 NOTE — Assessment & Plan Note (Signed)
Controlled HTN at goal - Home BP readings reported normal Complication with CKD-III Failed Amlodipine - edema    Plan:  1. Continue current BP regimen Losartan 100mg  daily, Metoprolol 100mg  BID - May continue Furosemide 20-40mg  BID for edema, again advise may need to limit diuretic due to CKD 2. Encourage improved lifestyle - low sodium diet, regular exercise 3. Continue monitor BP outside office, bring readings to next visit, if persistently >150/90 or new symptoms notify office sooner

## 2018-12-03 NOTE — Assessment & Plan Note (Signed)
Stable chronic mixed asthma / COPD in patient with environmental allergies but never smoker, but 2nd hand smoke Followed by Pulm ([facility]) Controlled on Symbicort, Spiriva, Albuterol Continue Singulair, Cetirizine Follow-up as needed 

## 2018-12-03 NOTE — Patient Instructions (Addendum)
Thank you for coming to the office today.  You received a Left Knee Joint steroid injection today. - Lidocaine numbing medicine may ease the pain initially for a few hours until it wears off - As discussed, you may experience a "steroid flare" this evening or within 24-48 hours, anytime medicine is injected into an inflamed joint it can cause the pain to get worse temporarily - Everyone responds differently to these injections, it depends on the patient and the severity of the joint problem, it may provide anywhere from days to weeks, to months of relief. Ideal response is >6 months relief - Try to take it easy for next 1-2 days, avoid over activity and strain on joint (limit walking for knee) - Recommend the following:   - For swelling - rest, compression sleeve / ACE wrap, elevation, and ice packs as needed for first few days   - For pain in future may use heating pad or moist heat as needed  Lab results below:  If taking too much fluid pill Lasix - taking double dose twice a day then need to notify office - we can re-check kidney function within 3 months approximately  ------  1. Chemistry - Elevated creatinine to 1.45, consistent with CKD-III still, slightly declined. Elevated blood sugar.  2. Hemoglobin A1c (Diabetes) - 6.8 - stable from prior 6.7 to 6.8  3. TSH Thyroid Function Tests - Normal.  4. Cholesterol - Mostly controlled on Simvastatin, slightly low HDL  5. CBC Blood Counts - Normal, no anemia, other abnormality  ------------------  Please call and schedule for Yearly Diabetic Eye Exam - send [place] a copy  [facility] [address] [place], [place] [address] Phone: [phone] [email or web address]   Please schedule a Follow-up Appointment to: Return in about 3 months (around 03/05/2019) for knee pain.  If you have any other questions or concerns, please feel free to call the office or send a message through MyChart. You may also schedule an earlier  appointment if necessary.  Additionally, you may be receiving a survey about your experience at our office within a few days to 1 week by e-mail or mail. We value your feedback.  Saralyn Pilar, DO [facility], [facility], [facility], [place]
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
## 2018-12-03 NOTE — Assessment & Plan Note (Signed)
Controlled cholesterol on statin and lifestyle Last lipid panel 11/2018  Plan: 1. Continue current meds - Simvastatin 10mg  daily 2. Continue ASA 81mg  for primary ASCVD risk reduction 3. Encourage improved lifestyle - low carb/cholesterol, reduce portion size, continue improving regular exercise

## 2018-12-03 NOTE — Assessment & Plan Note (Signed)
Stable to improved On Lasix, future will taper down Caution CKD

## 2018-12-03 NOTE — Assessment & Plan Note (Signed)
Well-controlled DM with A1c 6.8 Complications - CKD-III, peripheral neuropathy, other including hyperlipidemia, GERD, depression, obesity - increases risk of future cardiovascular complications   Plan:  1. Continue current therapy - Metformin 500mg  daily 2. Encourage improved lifestyle - low carb, low sugar diet, reduce portion size, continue improving regular exercise 3. Continue ASA, ARB, Statin 4. Advised to schedule DM ophtho exam, send record -  EYe  F/u q 6 mo
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
## 2019-01-09 ENCOUNTER — Other Ambulatory Visit: Payer: Self-pay | Admitting: Family Medicine

## 2019-01-09 DIAGNOSIS — R600 Localized edema: Secondary | ICD-10-CM

## 2019-01-09 DIAGNOSIS — M1732 Unilateral post-traumatic osteoarthritis, left knee: Secondary | ICD-10-CM

## 2019-03-06 ENCOUNTER — Other Ambulatory Visit: Payer: Self-pay | Admitting: Family Medicine

## 2019-03-06 DIAGNOSIS — M1732 Unilateral post-traumatic osteoarthritis, left knee: Secondary | ICD-10-CM

## 2019-03-08 ENCOUNTER — Encounter: Payer: Self-pay | Admitting: Family Medicine

## 2019-03-08 ENCOUNTER — Other Ambulatory Visit: Payer: Self-pay | Admitting: Family Medicine

## 2019-03-08 ENCOUNTER — Ambulatory Visit (INDEPENDENT_AMBULATORY_CARE_PROVIDER_SITE_OTHER): Payer: Medicare HMO | Admitting: Family Medicine

## 2019-03-08 ENCOUNTER — Other Ambulatory Visit: Payer: Self-pay

## 2019-03-08 VITALS — BP 121/82 | HR 93 | Temp 97.9°F | Ht 66.0 in | Wt 207.2 lb

## 2019-03-08 DIAGNOSIS — J449 Chronic obstructive pulmonary disease, unspecified: Secondary | ICD-10-CM

## 2019-03-08 DIAGNOSIS — N183 Chronic kidney disease, stage 3 unspecified: Secondary | ICD-10-CM

## 2019-03-08 DIAGNOSIS — E1121 Type 2 diabetes mellitus with diabetic nephropathy: Secondary | ICD-10-CM

## 2019-03-08 DIAGNOSIS — M1732 Unilateral post-traumatic osteoarthritis, left knee: Secondary | ICD-10-CM

## 2019-03-08 DIAGNOSIS — E1169 Type 2 diabetes mellitus with other specified complication: Secondary | ICD-10-CM

## 2019-03-08 DIAGNOSIS — I129 Hypertensive chronic kidney disease with stage 1 through stage 4 chronic kidney disease, or unspecified chronic kidney disease: Secondary | ICD-10-CM

## 2019-03-08 MED ORDER — METHYLPREDNISOLONE ACETATE 40 MG/ML IJ SUSP: 40.0000 mg | Freq: Once | INTRAMUSCULAR | Status: DC

## 2019-03-08 MED ORDER — LIDOCAINE HCL (PF) 1 % IJ SOLN: 4.0000 mL | Freq: Once | INTRAMUSCULAR | Status: DC

## 2019-03-08 NOTE — Patient Instructions (Addendum)
Thank you for coming to the office today.  You received a Left *Knee Joint steroid injection today. - Lidocaine numbing medicine may ease the pain initially for a few hours until it wears off - As discussed, you may experience a "steroid flare" this evening or within 24-48 hours, anytime medicine is injected into an inflamed joint it can cause the pain to get worse temporarily - Everyone responds differently to these injections, it depends on the patient and the severity of the joint problem, it may provide anywhere from days to weeks, to months of relief. Ideal response is >6 months relief - Try to take it easy for next 1-2 days, avoid over activity and strain on joint (limit walking for knee) - Recommend the following:   - For swelling - rest, compression sleeve / ACE wrap, elevation, and ice packs as needed for first few days   - For pain in future may use heating pad or moist heat as needed  Medication - Continue the topical Diclofenac  Referral has already been placed to Orthopedics, see below.  [facility] ([facility] Address: [address], [place],  [address] Hours:  9AM-5PM Phone: [phone]  DUE for FASTING BLOOD WORK (no food or drink after midnight before the lab appointment, only water or coffee without cream/sugar on the morning of)  SCHEDULE "Lab Only" visit in the morning at the clinic for lab draw in 3 MONTHS   - Make sure Lab Only appointment is at about 1 week before your next appointment, so that results will be available  For Lab Results, once available within 2-3 days of blood draw, you can can log in to MyChart online to view your results and a brief explanation. Also, we can discuss results at next follow-up visit.    Please schedule a Follow-up Appointment to: Return in about 3 months (around 06/08/2019) for annual physical.  If you have any other questions or concerns, please feel free to call the office or send a message  through [place]. You may also schedule an earlier appointment if necessary.  Additionally, you may be receiving a survey about your experience at our office within a few days to 1 week by e-mail or mail. We value your feedback.  Nobie Putnam, DO [place]
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
## 2019-03-21 DIAGNOSIS — M1712 Unilateral primary osteoarthritis, left knee: Secondary | ICD-10-CM | POA: Diagnosis not present

## 2019-03-21 DIAGNOSIS — M170 Bilateral primary osteoarthritis of knee: Secondary | ICD-10-CM | POA: Diagnosis not present

## 2019-03-21 DIAGNOSIS — M1711 Unilateral primary osteoarthritis, right knee: Secondary | ICD-10-CM | POA: Diagnosis not present

## 2019-04-01 ENCOUNTER — Other Ambulatory Visit: Payer: Self-pay | Admitting: Family Medicine

## 2019-04-01 DIAGNOSIS — J302 Other seasonal allergic rhinitis: Secondary | ICD-10-CM

## 2019-04-01 DIAGNOSIS — E1169 Type 2 diabetes mellitus with other specified complication: Secondary | ICD-10-CM

## 2019-04-01 DIAGNOSIS — I10 Essential (primary) hypertension: Secondary | ICD-10-CM

## 2019-04-01 DIAGNOSIS — J449 Chronic obstructive pulmonary disease, unspecified: Secondary | ICD-10-CM

## 2019-04-01 DIAGNOSIS — M1732 Unilateral post-traumatic osteoarthritis, left knee: Secondary | ICD-10-CM

## 2019-04-01 DIAGNOSIS — E1121 Type 2 diabetes mellitus with diabetic nephropathy: Secondary | ICD-10-CM

## 2019-04-29 ENCOUNTER — Other Ambulatory Visit: Payer: Self-pay

## 2019-04-29 ENCOUNTER — Ambulatory Visit (INDEPENDENT_AMBULATORY_CARE_PROVIDER_SITE_OTHER): Payer: Medicare HMO | Admitting: Podiatry

## 2019-04-29 ENCOUNTER — Encounter: Payer: Self-pay | Admitting: Podiatry

## 2019-04-29 DIAGNOSIS — B351 Tinea unguium: Secondary | ICD-10-CM | POA: Diagnosis not present

## 2019-04-29 DIAGNOSIS — M79674 Pain in right toe(s): Secondary | ICD-10-CM | POA: Diagnosis not present

## 2019-04-29 DIAGNOSIS — E119 Type 2 diabetes mellitus without complications: Secondary | ICD-10-CM

## 2019-04-29 DIAGNOSIS — M79675 Pain in left toe(s): Secondary | ICD-10-CM

## 2019-04-29 NOTE — Progress Notes (Signed)
This patient presents to the office with chief complaint of long thick nails and diabetic feet.  This patient  says there  is  no pain and discomfort in their feet.  This patient says there are long thick painful nails.  These nails are painful walking and wearing shoes.  Patient has no history of infection or drainage from both feet.  Patient is unable to  self treat his own nails . This patient presents  to the office today for treatment of the  long nails and a foot evaluation due to history of  diabetes.  General Appearance  Alert, conversant and in no acute stress.  Vascular  Dorsalis pedis and posterior tibial  pulses are  Weakly  palpable  bilaterally.  Capillary return is within normal limits  bilaterally. Temperature is within normal limits  bilaterally.  Neurologic  Senn-Weinstein monofilament wire test within normal limits  bilaterally. Muscle power within normal limits bilaterally.  Nails Thick disfigured discolored nails with subungual debris  from hallux to fifth toes bilaterally. No evidence of bacterial infection or drainage bilaterally.  Orthopedic  No limitations of motion of motion feet .  No crepitus or effusions noted.  No bony pathology or digital deformities noted.  Skin  normotropic skin with no porokeratosis noted bilaterally.  No signs of infections or ulcers noted.     Onychomycosis  Diabetes with no foot complications  IE  Debride nails x 10.  A diabetic foot exam was performed and there is no evidence of any vascular or neurologic pathology.   RTC 3 months.   Gardiner Barefoot DPM

## 2019-05-20 ENCOUNTER — Telehealth: Payer: Self-pay | Admitting: Family Medicine

## 2019-05-20 NOTE — Chronic Care Management (AMB) (Signed)
Chronic Care Management   Note  05/20/2019 Name: Sharon Henderson MRN: [id number] DOB: 09-04-1941  Sharon Henderson is a 78 y.o. year old female who is a primary care patient of Olin Hauser, DO. I reached out to [employer] by phone today in response to a referral sent by [name]'s health plan.    [name] was given information about Chronic Care Management services today including:  1. CCM service includes personalized support from designated clinical staff supervised by her physician, including individualized plan of care and coordination with other care providers 2. 24/7 contact phone numbers for assistance for urgent and routine care needs. 3. Service will only be billed when office clinical staff spend 20 minutes or more in a month to coordinate care. 4. Only one practitioner may furnish and bill the service in a calendar month. 5. The patient may stop CCM services at any time (effective at the end of the month) by phone call to the office staff. 6. The patient will be responsible for cost sharing (co-pay) of up to 20% of the service fee (after annual deductible is met).  Patient agreed to services and verbal consent obtained.   Follow up plan: Telephone appointment with CCM team member scheduled for: 05/23/2019  [place]  [email or web address]   ??[phone]

## 2019-05-23 ENCOUNTER — Ambulatory Visit: Payer: Medicare HMO | Admitting: Pharmacist

## 2019-05-23 DIAGNOSIS — N183 Chronic kidney disease, stage 3 unspecified: Secondary | ICD-10-CM

## 2019-05-23 DIAGNOSIS — E119 Type 2 diabetes mellitus without complications: Secondary | ICD-10-CM

## 2019-05-23 DIAGNOSIS — E1169 Type 2 diabetes mellitus with other specified complication: Secondary | ICD-10-CM

## 2019-05-23 DIAGNOSIS — J449 Chronic obstructive pulmonary disease, unspecified: Secondary | ICD-10-CM

## 2019-05-23 DIAGNOSIS — I129 Hypertensive chronic kidney disease with stage 1 through stage 4 chronic kidney disease, or unspecified chronic kidney disease: Secondary | ICD-10-CM

## 2019-05-23 NOTE — Chronic Care Management (AMB) (Signed)
Chronic Care Management   Note  05/23/2019 Name: Sharon Henderson MRN: [id number] DOB: 17-Jul-1941   Subjective:   Sharon Henderson is a 78 y.o. year old female who is a primary care patient of Sharon Hauser, DO. The CM team was consulted for assistance with chronic disease management and care coordination. The CCM team was consulted for assistance with chronic disease management and care coordination needs by patient's health plan.    I reached out to patient by phone today as scheduled by Care Guide.  Review of patient status, including review of consultants reports, laboratory and other test data, was performed as part of comprehensive evaluation and provision of chronic care management services.   Objective:  Lab Results  Component Value Date   CREATININE 1.45 (H) 11/27/2018   CREATININE 1.34 (H) 10/11/2016   CREATININE 1.18 (H) 02/29/2016    Lab Results  Component Value Date   HGBA1C 6.8 (H) 11/27/2018       Component Value Date/Time   CHOL 151 11/27/2018 0840   CHOL 224 (H) 05/19/2015 1126   TRIG 103 11/27/2018 0840   HDL 45 (L) 11/27/2018 0840   HDL 68 05/19/2015 1126   CHOLHDL 3.4 11/27/2018 0840   VLDL 17 10/11/2016 0001   LDLCALC 86 11/27/2018 0840    BP Readings from Last 3 Encounters:  04/29/19 139/90  03/08/19 121/82  12/03/18 119/66    Allergies  Allergen Reactions  . Other Anaphylaxis    shellfish   . Shellfish Allergy Other (See Comments)  . Mold Extract [Trichophyton] Other (See Comments)    Medications Reviewed Today    Reviewed by Vella Raring, [place] (Pharmacist) on 05/23/19 at 1523  Med List Status: <None>  Medication Order Taking? Sig Documenting Provider Last Dose Status Informant  acetaminophen (TYLENOL) 500 MG tablet [id number] Yes Take 1,000 mg by mouth every 8 (eight) hours as needed. [provider] Taking Active   albuterol (VENTOLIN HFA) 108 (90 Base) MCG/ACT inhaler [id number] Yes Inhale 1-2 puffs into the  lungs every 6 (six) hours as needed.  [provider] Taking Active   aspirin EC 81 MG tablet [id number] Yes Take by mouth. [provider] Taking Active            Med Note Barnet Pall, JAMIE A   Tue May 05, 2015  8:18 AM) Received from: [place]  budesonide-formoterol [facility]) 160-4.5 MCG/ACT inhaler [id number] Yes INHALE 2 INHALATIONS INTO THE LUNGS 2 (TWO) TIMES DAILY. Sharon Hauser, DO Taking Active   cetirizine (ZYRTEC) 10 MG tablet [id number] Yes TAKE ONE TABLET BY MOUTH DAILY Sharon Hauser, DO Taking Active   diclofenac sodium (VOLTAREN) 1 % GEL [id number] Yes APPLY TWO GRAMS TOPICALLY THREE TIMES DAILY AS NEEDED FOR LEFT KNEE ARTHRITIS Sharon Hauser, DO Taking Active   fluticasone (FLONASE) 50 MCG/ACT nasal spray [id number] Yes use 2 sprays in each nostril EVERY DAY [provider] Taking Active   furosemide (LASIX) 40 MG tablet [id number] Yes TAKE ONE TABLET BY MOUTH TWICE DAILY. If worsening swelling, may take 1 additional tab in afternoon. Sharon Hauser, DO Taking Active   ipratropium-albuterol (DUONEB) 0.5-2.5 (3) MG/3ML SOLN [id number] No ipratropium 0.5 mg-albuterol 3 mg (2.5 mg base)/3 mL nebulization soln  INHALE ONE vial via nebulizer FOUR TIMES DAILY [provider] Not Taking Active   lidocaine (PF) (XYLOCAINE) 1 % injection 4 mL [id number]   Karamalegos, Devonne Doughty, DO  Active   losartan (COZAAR) 100 MG  tablet [id number] Yes TAKE ONE TABLET BY MOUTH DAILY Smitty CordsKaramalegos, Alexander J, DO Taking Active   meloxicam (MOBIC) 15 MG tablet [id number] No Mobic 15 mg tablet  Take 1 tablet every day by oral route with meals. [provider] Not Taking Active   metFORMIN (GLUCOPHAGE) 500 MG tablet [id number] Yes TAKE ONE TABLET BY MOUTH DAILY WITH BREAKFAST Karamalegos, Netta NeatAlexander J, DO Taking Active   methylPREDNISolone acetate (DEPO-MEDROL) injection 40 mg [id number]   Smitty CordsKaramalegos, Alexander J, DO   Active   metoprolol tartrate (LOPRESSOR) 100 MG tablet [id number] Yes TAKE ONE TABLET BY MOUTH TWICE DAILY Smitty CordsKaramalegos, Alexander J, DO Taking Active   montelukast (SINGULAIR) 10 MG tablet [id number]  TAKE ONE TABLET BY MOUTH IN THE EVENING for allergies Smitty CordsKaramalegos, Alexander J, DO  Active   simvastatin (ZOCOR) 10 MG tablet [id number] Yes TAKE ONE TABLET BY MOUTH AT BEDTIME Smitty CordsKaramalegos, Alexander J, DO Taking Active   tiotropium (SPIRIVA HANDIHALER) 18 MCG inhalation capsule [id number] Yes Place 18 mcg into inhaler and inhale at bedtime.  [provider] Taking Active         Discontinued 05/23/19 1523 (Duplicate)            Assessment:   Goals Addressed            This Visit's Progress   . PharmD-Medication Management       Current Barriers:  [name]. Knowledge Deficits related to indications for her medications . Limited eye sight - Reports needing new glasses . Limited social support - Currently lives in assisted living facility, but reports needing more assistance  Pharmacist Clinical Goal(s):  [name]. Over the next 30 days, patient will work with CM Pharmacist and PCP to address needs related to medication adherence, coordination of care and medication regimen optimization  Interventions: . Unable to complete comprehensive medication review with [name] today. Reports difficutly with her vision o Admits to needing eye exam and new glasses.  o Encourage patient to contact [facility] doctor [facility] to schedule appointment o Appointment to complete medication review rescheduled with patient for next week when she states that neighbor/friend will be available to assist her . Reports medications currently pill packaged by [facility] . Identify patient currently in need of refill of albuterol inhaler. . Coordination of care -[name] reports arthritis symptoms currently uncontrolled.  o Note patient referred by PCP to [facility] in June for chronic Left knee post  traumatic arthritis - Per chart review, patient last seen at [facility] on 6/25.  - Encourage follow up with provider and provide phone number . Referred to care guide team for assistance with determining if patient qualifies for additional community/insurance resources  Patient Self Care Activities:  . Calls pharmacy for medication refills o Patient to call pharmacy for albuterol inhaler refill . Calls provider office for new concerns or questions o Patient to call [facility] for follow up appointment re: arthritis o Patient to [facility] to [facility] exam  Initial goal documentation        Plan:  Telephone follow up appointment with care management team member scheduled for: 8/31 at 3 pm  Duanne MoronElisabeth Wrenna Saks, PharmD, [facility] Sys MankatoBCACP Clinical Pharmacist [facility] [phone]

## 2019-05-23 NOTE — Patient Instructions (Signed)
Thank you allowing the Chronic Care Management Team to be a part of your care! It was a pleasure speaking with you today!     CCM (Chronic Care Management) Team    [name] RN, BSN Nurse Care Coordinator  [phone]   [name] PharmD  Clinical Pharmacist  [phone]   Eula Fried LCSW Clinical Social Worker [phone]  Visit Information  Goals Addressed            This Visit's Progress   . PharmD-Medication Management       Current Barriers:  [name] Knowledge Deficits related to indications for her medications . Limited eye sight - Reports needing new glasses . Limited social support - Currently lives in assisted living facility, but reports needing more assistance  Pharmacist Clinical Goal(s):  [name] Over the next 30 days, patient will work with CM Pharmacist and PCP to address needs related to medication adherence, coordination of care and medication regimen optimization  Interventions: . Unable to complete comprehensive medication review with Ms. Renier today. Reports difficutly with her vision o Admits to needing eye exam and new glasses.  o Encourage patient to contact eye doctor at [facility] to schedule appointment o Appointment to complete medication review rescheduled with patient for next week when she states that neighbor/friend will be available to assist her . Reports medications currently pill packaged by [facility] . Identify patient currently in need of refill of albuterol inhaler. . Coordination of care -Ms. Demarco reports arthritis symptoms currently uncontrolled.  o Note patient referred by PCP to [facility] in June for chronic Left knee post traumatic arthritis - Per chart review, patient last seen at [facility] on 6/25.  - Encourage follow up with provider and provide phone number . Referred to care guide team for assistance with determining if patient qualifies for additional community/insurance  resources  Patient Self Care Activities:  . Calls pharmacy for medication refills o Patient to call pharmacy for albuterol inhaler refill . Calls provider office for new concerns or questions o Patient to call [facility] for follow up appointment re: arthritis o Patient to call [facility] to schedule eye exam  Initial goal documentation        The patient verbalized understanding of instructions provided today and declined a print copy of patient instruction materials.   Telephone follow up appointment with care management team member scheduled for: 8/31 at 3 pm  [name], PharmD, [place] [phone]

## 2019-05-27 ENCOUNTER — Ambulatory Visit (INDEPENDENT_AMBULATORY_CARE_PROVIDER_SITE_OTHER): Payer: Medicare HMO | Admitting: Pharmacist

## 2019-05-27 DIAGNOSIS — J449 Chronic obstructive pulmonary disease, unspecified: Secondary | ICD-10-CM | POA: Diagnosis not present

## 2019-05-27 NOTE — Chronic Care Management (AMB) (Signed)
Chronic Care Management   Follow Up Note   05/27/2019 Name: NDEYE TENORIO MRN: [id number] DOB: May 25, 1941  Referred by: Olin Hauser, DO Reason for referral : Chronic Care Management (Patient Phone Call)   Sharon Henderson is a 78 y.o. year old female who is a primary care patient of Olin Hauser, DO. The CCM team was consulted for assistance with chronic disease management and care coordination needs by patient's health plan.   I reached out to [employer] by phone today as scheduled  Review of patient status, including review of consultants reports, relevant laboratory and other test results, and collaboration with appropriate care team members and the patient's provider was performed as part of comprehensive patient evaluation and provision of chronic care management services.     Outpatient Encounter Medications as of 05/27/2019  Medication Sig Note  . acetaminophen (TYLENOL) 500 MG tablet Take 1,000 mg by mouth every 8 (eight) hours as needed.   [name] albuterol (VENTOLIN HFA) 108 (90 Base) MCG/ACT inhaler Inhale 1-2 puffs into the lungs every 6 (six) hours as needed.    [name] aspirin EC 81 MG tablet Take by mouth. 05/05/2015: Received from: [facility]  . budesonide-formoterol (SYMBICORT) 160-4.5 MCG/ACT inhaler INHALE 2 INHALATIONS INTO THE LUNGS 2 (TWO) TIMES DAILY.   . cetirizine (ZYRTEC) 10 MG tablet TAKE ONE TABLET BY MOUTH DAILY   . diclofenac sodium (VOLTAREN) 1 % GEL APPLY TWO GRAMS TOPICALLY THREE TIMES DAILY AS NEEDED FOR LEFT KNEE ARTHRITIS   . fluticasone (FLONASE) 50 MCG/ACT nasal spray use 2 sprays in each nostril EVERY DAY   . furosemide (LASIX) 40 MG tablet TAKE ONE TABLET BY MOUTH TWICE DAILY. If worsening swelling, may take 1 additional tab in afternoon.   [name] ipratropium-albuterol (DUONEB) 0.5-2.5 (3) MG/3ML SOLN ipratropium 0.5 mg-albuterol 3 mg (2.5 mg base)/3 mL nebulization soln  INHALE ONE vial via nebulizer FOUR TIMES DAILY    . losartan (COZAAR) 100 MG tablet TAKE ONE TABLET BY MOUTH DAILY   . meloxicam (MOBIC) 15 MG tablet Mobic 15 mg tablet  Take 1 tablet every day by oral route with meals.   . metFORMIN (GLUCOPHAGE) 500 MG tablet TAKE ONE TABLET BY MOUTH DAILY WITH BREAKFAST   . metoprolol tartrate (LOPRESSOR) 100 MG tablet TAKE ONE TABLET BY MOUTH TWICE DAILY   . montelukast (SINGULAIR) 10 MG tablet TAKE ONE TABLET BY MOUTH IN THE EVENING for allergies   . simvastatin (ZOCOR) 10 MG tablet TAKE ONE TABLET BY MOUTH AT BEDTIME   . tiotropium (SPIRIVA HANDIHALER) 18 MCG inhalation capsule Place 18 mcg into inhaler and inhale at bedtime.     Facility-Administered Encounter Medications as of 05/27/2019  Medication  . lidocaine (PF) (XYLOCAINE) 1 % injection 4 mL  . methylPREDNISolone acetate (DEPO-MEDROL) injection 40 mg    Goals Addressed            This Visit's Progress   . PharmD-Medication Management       Current Barriers:  [name] Knowledge Deficits related to indications for her medications . Limited eye sight - Reports needing new glasses . Limited social support - Currently lives in assisted living facility, but reports needing more assistance  Pharmacist Clinical Goal(s):  [name] Over the next 30 days, patient will work with CM Pharmacist and PCP to address needs related to medication adherence, coordination of care and medication regimen optimization  Interventions: . Unable to complete comprehensive medication review with Ms. Anchondo again today. Reports that  neighbor/friend was unable to join her today. Confirms having my phone number saved and states that she will call me back to schedule a time when her friend can be present to assist . Encourage patient again to contact eye doctor at [facility] to schedule appointment . Remind patient to call pharmacy for refill of albuterol inhaler  Patient Self Care Activities:  . Calls pharmacy for medication refills o Patient to call pharmacy for  albuterol inhaler refill . Calls provider office for new concerns or questions o Patient to call [facility] for follow up appointment re: arthritis o Patient to call [facility] to schedule eye exam  Please see past updates related to this goal by clicking on the "Past Updates" button in the selected goal         Plan  The care management team will reach out to the patient again over the next 30 days.  The patient has been provided with contact information for the care management team and has been advised to call with any health related questions or concerns.    Duanne MoronElisabeth Upton Russey, PharmD, [facility] Clinical Pharmacist [facility] [phone]

## 2019-05-27 NOTE — Patient Instructions (Signed)
Thank you allowing the Chronic Care Management Team to be a part of your care! It was a pleasure speaking with you today!     CCM (Chronic Care Management) Team    [name] RN, BSN Nurse Care Coordinator  [phone]   [name] PharmD  Clinical Pharmacist  [phone]   Eula Fried LCSW Clinical Social Worker [phone]  Visit Information  Goals Addressed            This Visit's Progress   . PharmD-Medication Management       Current Barriers:  [name] Knowledge Deficits related to indications for her medications . Limited eye sight - Reports needing new glasses . Limited social support - Currently lives in assisted living facility, but reports needing more assistance  Pharmacist Clinical Goal(s):  [name] Over the next 30 days, patient will work with CM Pharmacist and PCP to address needs related to medication adherence, coordination of care and medication regimen optimization  Interventions: . Unable to complete comprehensive medication review with Ms. Kotlarz again today. Reports that neighbor/friend was unable to join her today. Confirms having my phone number saved and states that she will call me back to schedule a time when her friend can be present to assist . Encourage patient again to contact eye doctor at [facility] to schedule appointment . Remind patient to call pharmacy for refill of albuterol inhaler  Patient Self Care Activities:  . Calls pharmacy for medication refills o Patient to call pharmacy for albuterol inhaler refill . Calls provider office for new concerns or questions o Patient to call [facility] for follow up appointment re: arthritis o Patient to call [facility] to schedule eye exam  Please see past updates related to this goal by clicking on the "Past Updates" button in the selected goal         The patient verbalized understanding of instructions provided today and declined a print copy of patient  instruction materials.   The care management team will reach out to the patient again over the next 30 days.   [name], PharmD, [employer] [phone]

## 2019-06-05 ENCOUNTER — Other Ambulatory Visit: Payer: Medicare Other

## 2019-06-11 ENCOUNTER — Encounter: Payer: Self-pay | Admitting: Family Medicine

## 2019-06-11 ENCOUNTER — Other Ambulatory Visit: Payer: Self-pay

## 2019-06-11 ENCOUNTER — Other Ambulatory Visit: Payer: Self-pay | Admitting: Family Medicine

## 2019-06-11 ENCOUNTER — Ambulatory Visit (INDEPENDENT_AMBULATORY_CARE_PROVIDER_SITE_OTHER): Payer: Medicare HMO | Admitting: Family Medicine

## 2019-06-11 VITALS — BP 108/62 | HR 66 | Ht 66.0 in | Wt 201.0 lb

## 2019-06-11 DIAGNOSIS — N183 Chronic kidney disease, stage 3 unspecified: Secondary | ICD-10-CM

## 2019-06-11 DIAGNOSIS — I129 Hypertensive chronic kidney disease with stage 1 through stage 4 chronic kidney disease, or unspecified chronic kidney disease: Secondary | ICD-10-CM | POA: Diagnosis not present

## 2019-06-11 DIAGNOSIS — J449 Chronic obstructive pulmonary disease, unspecified: Secondary | ICD-10-CM

## 2019-06-11 DIAGNOSIS — E1121 Type 2 diabetes mellitus with diabetic nephropathy: Secondary | ICD-10-CM | POA: Diagnosis not present

## 2019-06-11 DIAGNOSIS — E1169 Type 2 diabetes mellitus with other specified complication: Secondary | ICD-10-CM

## 2019-06-11 DIAGNOSIS — Z0000 Encounter for general adult medical examination without abnormal findings: Secondary | ICD-10-CM

## 2019-06-11 DIAGNOSIS — Z23 Encounter for immunization: Secondary | ICD-10-CM | POA: Diagnosis not present

## 2019-06-11 DIAGNOSIS — E785 Hyperlipidemia, unspecified: Secondary | ICD-10-CM

## 2019-06-11 LAB — POCT GLYCOSYLATED HEMOGLOBIN (HGB A1C): Hemoglobin A1C: 6.9 % — AB (ref 4.0–5.6)

## 2019-06-11 MED ORDER — BUDESONIDE-FORMOTEROL FUMARATE 160-4.5 MCG/ACT IN AERO: INHALATION_SPRAY | RESPIRATORY_TRACT | 11 refills | Status: DC

## 2019-06-11 NOTE — Assessment & Plan Note (Signed)
Well-controlled DM with X7L 6.9 Complications - CKD-III, peripheral neuropathy, other including hyperlipidemia, GERD, depression, obesity - increases risk of future cardiovascular complications   Plan:  1. Continue current therapy - Metformin 500mg  daily 2. Encourage improved lifestyle - low carb, low sugar diet, reduce portion size, continue improving regular exercise 3. Continue ASA, ARB, Statin DM foot today 4. Advised to schedule DM ophtho exam, send record - [facility]  F/u q 6 mo

## 2019-06-11 NOTE — Progress Notes (Signed)
Subjective:    Patient ID: Sharon Henderson, female    DOB: 05/17/1941, 78 y.o.   MRN: [id number]  Sharon Henderson is a 78 y.o. female presenting on 06/11/2019 for Diabetes (POCT A1C. High dose flu shot. Patient had a CPE in March so unable to due CPE today.)   HPI   DM, Type 2 with CKD-III Reports no concerns. A1c stable, last lab 6.8 - now due today for A1c Meds: Metformin 500mg  once daily with food Reports good compliance. Tolerating well w/o side-effects Currently on ARB, Statin, Aspirin 81 Lifestyle: - Diet (No changes - still doing well withDM diet with low carb, limits portion, drinks mostly water) - Exercise (lives in retirement home, less walking now due to COVID19 restrictions) Due for DM Eye exam - [facility], she will try to schedulestill - unfortunately she was unable to keep last apt Denies hypoglycemia  CHRONIC HTN, with Peripheral Lower Extremity Edema Reports occasional home BP check normal range Current Meds - Losartan 100mg  , Metoprolol 100mg  BID - Also takes Furosemide 40mg  daily everyday(rarely takes 2-3 pills on day if worse) Reports good compliance, took meds today. Tolerating well, w/o complaints. - For Edema, she tries compression and elevation  COPD / Asthma(Moderate persistent asthma) / Seasonal and Environmental Allergies - Followed by [facility], dx mixed type COPD secondary to 2nd hand smoke, had PFTs and allergic type obstructive airway. - Continues current medications with Spiriva, Symbicort, and Albuterol PRN - No new flare up - Continues allergy treatment, singulair and cetirizine - Refill Symbicort  Health Maintenance: Due for Flu Shot, will receive today    Dep[facility] 06/11/2019 12/03/2018 11/27/2018  Decreased Interest 0 0 0  Down, Depressed, Hopeless 1 0 0  PHQ - 2 Score 1 0 0  Altered sleeping - - -  Tired, decreased energy - - -  Change in appetite - - -  Feeling bad or failure about yourself  - - -   Trouble concentrating - - -  Moving slowly or fidgety/restless - - -  Suicidal thoughts - - -  PHQ-9 Score - - -  Difficult doing work/chores - - -    Social History   Tobacco Use  . Smoking status: Never Smoker  . Smokeless tobacco: Never Used  Substance Use Topics  . Alcohol use: No    Alcohol/week: 0.0 standard drinks  . Drug use: No    Review of Systems Per HPI unless specifically indicated above     Objective:    BP 108/62   Pulse 66   Ht 5\' 6"  (1.676 m)   Wt 201 lb (91.2 kg)   SpO2 100%   BMI 32.44 kg/m   Wt Readings from Last 3 Encounters:  06/11/19 201 lb (91.2 kg)  03/08/19 207 lb 3.2 oz (94 kg)  12/03/18 216 lb 6.4 oz (98.2 kg)    Physical Exam Vitals signs and nursing note reviewed.  Constitutional:      General: She is not in acute distress.    Appearance: She is well-developed. She is not diaphoretic.     Comments: Well-appearing, comfortable, cooperative, overweight  HENT:     Head: Normocephalic and atraumatic.  Eyes:     General:        Right eye: No discharge.        Left eye: No discharge.     Conjunctiva/sclera: Conjunctivae normal.  Neck:     Musculoskeletal: Normal range of motion and neck supple.  Thyroid: No thyromegaly.  Cardiovascular:     Rate and Rhythm: Normal rate and regular rhythm.     Heart sounds: Normal heart sounds. No murmur.  Pulmonary:     Effort: Pulmonary effort is normal. No respiratory distress.     Breath sounds: Normal breath sounds. No wheezing or rales.  Musculoskeletal: Normal range of motion.     Right lower leg: Edema (trace to +1) present.     Left lower leg: Edema (trace to +1) present.  Lymphadenopathy:     Cervical: No cervical adenopathy.  Skin:    General: Skin is warm and dry.     Findings: No erythema or rash.  Neurological:     Mental Status: She is alert and oriented to person, place, and time.  Psychiatric:        Behavior: Behavior normal.     Comments: Well groomed, good eye  contact, normal speech and thoughts      Diabetic Foot Exam - Simple   Simple Foot Form Diabetic Foot exam was performed with the following findings: Yes 06/11/2019  9:38 AM  Visual Inspection No deformities, no ulcerations, no other skin breakdown bilaterally: Yes Sensation Testing Intact to touch and monofilament testing bilaterally: Yes Pulse Check Posterior Tibialis and Dorsalis pulse intact bilaterally: Yes Comments      Recent Labs    11/27/18 0840 06/11/19 0932  HGBA1C 6.8* 6.9*    Results for orders placed or performed in visit on 06/11/19  POCT HgB A1C  Result Value Ref Range   Hemoglobin A1C 6.9 (A) 4.0 - 5.6 %   HbA1c POC (<> result, manual entry)     HbA1c, POC (prediabetic range)     HbA1c, POC (controlled diabetic range)        Assessment & Plan:   Problem List Items Addressed This Visit    Asthma-chronic obstructive pulmonary disease overlap syndrome (HCC)    Stable chronic mixed asthma / COPD in patient with environmental allergies but never smoker, but 2nd hand smoke Followed by Pulm ([facility]) Controlled on Symbicort, Spiriva, Albuterol Continue Singulair, Cetirizine Follow-up as needed      Relevant Medications   budesonide-formoterol (SYMBICORT) 160-4.5 MCG/ACT inhaler   Benign hypertension with CKD (chronic kidney disease) stage III ([place])    Controlled HTN at goal - Home BP readings reported normal Complication with CKD-III Failed Amlodipine - edema    Plan:  1. Continue current BP regimen Losartan 100mg  daily, Metoprolol 100mg  BID - May continue Furosemide 40mg  daily for edema, again advise may need to limit diuretic due to CKD 2. Encourage improved lifestyle - low sodium diet, regular exercise 3. Continue monitor BP outside office, bring readings to next visit, if persistently >150/90 or new symptoms notify office sooner      Relevant Orders   BASIC METABOLIC PANEL WITH GFR   CKD (chronic kidney disease), stage III (HCC)     Stable CKD, secondary to likely HTN, DM, Age  Plan: 1. Continue to control BP, A1c 2. Improve hydration 3. Remain off oral NSAIDs      Relevant Orders   BASIC METABOLIC PANEL WITH GFR   Well controlled type 2 diabetes mellitus with nephropathy ([place]) - Primary    Well-controlled DM with G2I 6.9 Complications - CKD-III, peripheral neuropathy, other including hyperlipidemia, GERD, depression, obesity - increases risk of future cardiovascular complications   Plan:  1. Continue current therapy - Metformin 500mg  daily 2. Encourage improved lifestyle - low carb, low sugar diet,  reduce portion size, continue improving regular exercise 3. Continue ASA, ARB, Statin DM foot today 4. Advised to schedule DM ophtho exam, send record - [facility]  F/u q 6 mo      Relevant Orders   POCT HgB A1C (Completed)   BASIC METABOLIC PANEL WITH GFR    Other Visit Diagnoses    Needs flu shot       Need for immunization against influenza       Relevant Orders   Flu Vaccine QUAD High Dose(Fluad) (Completed)      Meds ordered this encounter  Medications  . budesonide-formoterol (SYMBICORT) 160-4.5 MCG/ACT inhaler    Sig: INHALE 2 INHALATIONS INTO THE LUNGS 2 (TWO) TIMES DAILY.    Dispense:  10.2 Inhaler    Refill:  11     Follow up plan: Return in about 6 months (around 12/09/2019) for Annual Physical.  Future labs ordered for 11/2019   Saralyn Pilar, DO [facility] 06/11/2019, 9:27 AM

## 2019-06-11 NOTE — Patient Instructions (Addendum)
Thank you for coming to the office today.  Recent Labs    11/27/18 0840 06/11/19 0932  HGBA1C 6.8* 6.9*   Keep up the good work.  No changes to meds.  REfilled Symbicort  Flu shot today  DUE for FASTING BLOOD WORK (no food or drink after midnight before the lab appointment, only water or coffee without cream/sugar on the morning of)  SCHEDULE "Lab Only" visit in the morning at the clinic for lab draw in 6 MONTHS   - Make sure Lab Only appointment is at about 1 week before your next appointment, so that results will be available  For Lab Results, once available within 2-3 days of blood draw, you can can log in to MyChart online to view your results and a brief explanation. Also, we can discuss results at next follow-up visit.   Please schedule a Follow-up Appointment to: Return in about 6 months (around 12/09/2019) for Annual Physical.  If you have any other questions or concerns, please feel free to call the office or send a message through [place]. You may also schedule an earlier appointment if necessary.  Additionally, you may be receiving a survey about your experience at our office within a few days to 1 week by e-mail or mail. We value your feedback.  Nobie Putnam, DO [place]

## 2019-06-11 NOTE — Assessment & Plan Note (Signed)
Stable chronic mixed asthma / COPD in patient with environmental allergies but never smoker, but 2nd hand smoke Followed by Pulm ([facility]) Controlled on Symbicort, Spiriva, Albuterol Continue Singulair, Cetirizine Follow-up as needed

## 2019-06-11 NOTE — Assessment & Plan Note (Signed)
Stable CKD, secondary to likely HTN, DM, Age  Plan: 1. Continue to control BP, A1c 2. Improve hydration 3. Remain off oral NSAIDs

## 2019-06-11 NOTE — Assessment & Plan Note (Signed)
Controlled HTN at goal - Home BP readings reported normal Complication with CKD-III Failed Amlodipine - edema    Plan:  1. Continue current BP regimen Losartan 100mg  daily, Metoprolol 100mg  BID - May continue Furosemide 40mg  daily for edema, again advise may need to limit diuretic due to CKD 2. Encourage improved lifestyle - low sodium diet, regular exercise 3. Continue monitor BP outside office, bring readings to next visit, if persistently >150/90 or new symptoms notify office sooner

## 2019-06-12 LAB — BASIC METABOLIC PANEL WITH GFR
BUN/Creatinine Ratio: 15 (calc) (ref 6–22)
BUN: 27 mg/dL — ABNORMAL HIGH (ref 7–25)
CO2: 25 mmol/L (ref 20–32)
Calcium: 9.4 mg/dL (ref 8.6–10.4)
Chloride: 105 mmol/L (ref 98–110)
Creat: 1.76 mg/dL — ABNORMAL HIGH (ref 0.60–0.93)
GFR, Est African American: 31 mL/min/{1.73_m2} — ABNORMAL LOW (ref >=60)
GFR, Est Non African American: 27 mL/min/{1.73_m2} — ABNORMAL LOW (ref >=60)
Glucose, Bld: 154 mg/dL — ABNORMAL HIGH (ref 65–99)
Potassium: 4 mmol/L (ref 3.5–5.3)
Sodium: 144 mmol/L (ref 135–146)

## 2019-06-17 ENCOUNTER — Telehealth: Payer: Self-pay | Admitting: Family Medicine

## 2019-06-17 NOTE — Telephone Encounter (Signed)
Reviewed recent lab results with elevated Creatinine.  See lab result note from Cassell Smiles, AGPCNP-BC covering for me at the time.  I attempted to call patient today since returning to office on Monday 9/21 - but did not reach her.  Could you please call patient to review how she is doing on current dose medications and make sure she is following these recommendations?  1. Lasix - need to reduce to 40mg  only 1 pill a day if possible, if she needs she can take 2 in 24 hours - either 2 at once or 1 twice a day - max.  2. She should HOLD Losartan 100mg  for now for 2 weeks then she may resume it.  3. She should use topical Diclofenac cream on knees for arthritis and take Tylenol but she should HOLD off on taking any other anti inflammatory (no aleve, advil, ibuprofen, BC)  Recommend to start taking Tylenol Extra Strength 500mg  tabs - take 1 to 2 tabs per dose (max 1000mg ) every 6-8 hours for pain (take regularly)  We can repeat a kidney lab only within next 2-4 weeks if she would like to do this - and we will need to arrange with lab to only check a new BMET order at time of her visit, not to release any future labs for March.  Nobie Putnam, [facility] 06/17/2019, 4:45 PM

## 2019-06-18 NOTE — Telephone Encounter (Signed)
Attempted to reach patient. No Vm unable to leave message.

## 2019-06-18 NOTE — Telephone Encounter (Signed)
Advised and mailed copies of plan and results. I also set myself reminder to call in 2 weeks to remind to start back on Losartan and schedule Lab Only for CKD.

## 2019-06-20 ENCOUNTER — Ambulatory Visit: Payer: Self-pay | Admitting: Pharmacist

## 2019-06-20 ENCOUNTER — Telehealth: Payer: Self-pay

## 2019-06-20 DIAGNOSIS — N183 Chronic kidney disease, stage 3 unspecified: Secondary | ICD-10-CM

## 2019-06-20 DIAGNOSIS — J302 Other seasonal allergic rhinitis: Secondary | ICD-10-CM

## 2019-06-20 NOTE — Chronic Care Management (AMB) (Signed)
Chronic Care Management   Follow Up Note   06/20/2019 Name: NIVEA WOJDYLA MRN: [id number] DOB: 11-02-1940  Referred by: Olin Hauser, DO Reason for referral : Chronic Care Management (Patient Phone Call)   RANAE CASEBIER is a 78 y.o. year old female who is a primary care patient of Olin Hauser, DO. The CCM team was consulted for assistance with chronic disease management and care coordination needs by patient's health plan.   Was unable to reach patient via telephone today and unable to leave a message as no voicemail is available.   Review of patient status, including review of consultants reports, relevant laboratory and other test results, and collaboration with appropriate care team members and the patient's provider was performed as part of comprehensive patient evaluation and provision of chronic care management services.     Outpatient Encounter Medications as of 06/20/2019  Medication Sig  . albuterol (VENTOLIN HFA) 108 (90 Base) MCG/ACT inhaler Inhale 1-2 puffs into the lungs every 6 (six) hours as needed.   . budesonide-formoterol (SYMBICORT) 160-4.5 MCG/ACT inhaler INHALE 2 INHALATIONS INTO THE LUNGS 2 (TWO) TIMES DAILY.  . cetirizine (ZYRTEC) 10 MG tablet TAKE ONE TABLET BY MOUTH DAILY  . diclofenac sodium (VOLTAREN) 1 % GEL APPLY TWO GRAMS TOPICALLY THREE TIMES DAILY AS NEEDED FOR LEFT KNEE ARTHRITIS  . furosemide (LASIX) 40 MG tablet TAKE ONE TABLET BY MOUTH TWICE DAILY. If worsening swelling, may take 1 additional tab in afternoon.  [name] losartan (COZAAR) 100 MG tablet TAKE ONE TABLET BY MOUTH DAILY  . metFORMIN (GLUCOPHAGE) 500 MG tablet TAKE ONE TABLET BY MOUTH DAILY WITH BREAKFAST  . metoprolol tartrate (LOPRESSOR) 100 MG tablet TAKE ONE TABLET BY MOUTH TWICE DAILY  . montelukast (SINGULAIR) 10 MG tablet TAKE ONE TABLET BY MOUTH IN THE EVENING for allergies  . Multiple Vitamins-Minerals (CENTRUM SILVER 50+WOMEN PO) Take by mouth.  . simvastatin  (ZOCOR) 10 MG tablet TAKE ONE TABLET BY MOUTH AT BEDTIME  . tiotropium (SPIRIVA HANDIHALER) 18 MCG inhalation capsule Place 18 mcg into inhaler and inhale at bedtime.    No facility-administered encounter medications on file as of 06/20/2019.     Goals Addressed            This Visit's Progress   . PharmD-Medication Management       Current Barriers:  [name] Knowledge Deficits related to indications for her medications . Limited eye sight - Reports needing new glasses . Limited social support - Currently lives in assisted living facility, but reports needing more assistance  Pharmacist Clinical Goal(s):  [name] Over the next 30 days, patient will work with CM Pharmacist and PCP to address needs related to medication adherence, coordination of care and medication regimen optimization  Interventions: . Perform chart review o Patient seen by PCP on 9/15  - Lab work from 9/15 showed elevated serum creatinine (1.76 mg/dL); Calculate CrCl: ~30 mL/min (based on adjusted body weight) o Per 9/21 telephone note, patient advised to: - Reduce Lasix dose (40mg  only 1 pill a day if possible, if she needs she can take 2 in 24 hours - either 2 at once or 1 twice a day - max) - Hold losartan 100 mg for 2 weeks and then resume - Hold any NSAIDs - Use topical diclofenac cream for arthritis and Tylenol 500 mg - take 1 to 2 tabs per dose (max 1000mg ) every 6-8 hours for pain - May repeat BMET within next 2-4 weeks . Review medication list for  potential renal adjustment needs o For CrCl 11 to ?31 mL/minute: Recommend dose adjustment of cetirizine to 5 mg once daily . Unable to reach Ms. Veto Kemps by phone today  Patient Self Care Activities:  . Calls pharmacy for medication refills o Patient to call pharmacy for albuterol inhaler refill . Calls provider office for new concerns or questions o Patient to call [facility] for follow up appointment re: arthritis o Patient to call [facility] to schedule  eye exam  Please see past updates related to this goal by clicking on the "Past Updates" button in the selected goal         Plan  The care management team will reach out to the patient again over the next 14 days.   Duanne Moron, PharmD, [facility] Clinical Pharmacist [facility] [employer] [phone]

## 2019-07-01 ENCOUNTER — Telehealth: Payer: Self-pay

## 2019-07-01 DIAGNOSIS — N183 Chronic kidney disease, stage 3 unspecified: Secondary | ICD-10-CM

## 2019-07-01 DIAGNOSIS — I129 Hypertensive chronic kidney disease with stage 1 through stage 4 chronic kidney disease, or unspecified chronic kidney disease: Secondary | ICD-10-CM

## 2019-07-01 NOTE — Telephone Encounter (Signed)
-----   Message from Lazarus Gowda, [place] sent at 06/28/2019  9:35 AM EDT ----- Regarding: FW: [place] PATIENT  ----- Message ----- From: Lazarus Gowda, CMA Sent: 06/27/2019 To: Lazarus Gowda, CMA Subject: [facility] PATIENT                                   Need to call and remind to start back on Losartan and lab visit in another 1-3 weeks for CKD

## 2019-07-01 NOTE — Telephone Encounter (Signed)
Patient will restart Losartan.

## 2019-07-01 NOTE — Telephone Encounter (Signed)
She may restart Losartan.  She original message was from 06/17/19 and I advised her to [place] for 2 weeks.  Now she can restart it and keep her apt for blood work.  I have ordered and released a BMET - this is the ONLY lab she needs on 10/23. Could you please notify lab to make a reminder - only need BMET on 10/23 - she does not need any of the other future labs that are ordered for 11/2019?  Nobie Putnam, [facility] 07/01/2019, 1:00 PM

## 2019-07-01 NOTE — Telephone Encounter (Signed)
Called the patient to confirm that she is still off from losartan and her appointment for blood work is scheduled after 2 weeks.

## 2019-07-04 ENCOUNTER — Ambulatory Visit (INDEPENDENT_AMBULATORY_CARE_PROVIDER_SITE_OTHER): Payer: Medicare HMO | Admitting: Pharmacist

## 2019-07-04 DIAGNOSIS — I129 Hypertensive chronic kidney disease with stage 1 through stage 4 chronic kidney disease, or unspecified chronic kidney disease: Secondary | ICD-10-CM

## 2019-07-04 DIAGNOSIS — N183 Chronic kidney disease, stage 3 unspecified: Secondary | ICD-10-CM

## 2019-07-04 DIAGNOSIS — J302 Other seasonal allergic rhinitis: Secondary | ICD-10-CM

## 2019-07-04 NOTE — Patient Instructions (Signed)
Thank you allowing the Chronic Care Management Team to be a part of your care! It was a pleasure speaking with you today!     CCM (Chronic Care Management) Team    [name] RN, BSN Nurse Care Coordinator  [phone]   [name] PharmD  Clinical Pharmacist  [phone]   Eula Fried LCSW Clinical Social Worker [phone]  Visit Information  Goals Addressed            This Visit's Progress   . PharmD-Medication Management       Current Barriers:  [name] Knowledge Deficits related to indications for her medications . Limited eye sight - Reports needing new glasses . Limited social support - Currently lives in assisted living facility, but reports needing more assistance  Pharmacist Clinical Goal(s):  [name] Over the next 30 days, patient will work with CM Pharmacist and PCP to address needs related to medication adherence, coordination of care and medication regimen optimization  Interventions: . Reach Ms. Gravelle today o Patient confirms that she has restarted taking losartan 100 mg daily as directed o Reports that she is using acetaminophen, rather than NSAIDs, as advised by PCP o Confirms taking furosemide as directed by PCP . Counsel on importance of medication adherence o Ms. Fredin reports using Symbicort inhaler twice daily (and rinsing out mouth) as directed and Spiriva inhaler once each evening as directed o Receives her medications pill packed by [facility] o Patient reports continued difficulty with seeing the medications. Typically takes medication directly from package as filled by pharmacy - Notes both montelukast and cetirizine included in pill packaging based on package labeling . Renal dose adjustment o For CrCl 11 to ?31 mL/minute: Recommend dose adjustment of cetirizine to 5 mg once daily o Note patient's last calculated CrCl falls right on the border for this adjustment. Based on patient's limited ability to visualize her medications to make  adjustments, will follow for next renal function lab result, rather than recommend patient make adjustment at this time . Again encourage patient to call to schedule eye exam. Again provide patient phone number for [facility] o Ms. Dadamo calls today and makes next available appointment for eye exam, in January.  Patient Self Care Activities:  . Calls pharmacy for medication refills . Calls provider office for new concerns or questions o Appointment with [facility] in January  Please see past updates related to this goal by clicking on the "Past Updates" button in the selected goal         The patient verbalized understanding of instructions provided today and declined a print copy of patient instruction materials.   The care management team will reach out to the patient again over the next 30 days.   [name], PharmD, [place] [employer] [phone]

## 2019-07-04 NOTE — Chronic Care Management (AMB) (Signed)
Chronic Care Management   Follow Up Note   07/04/2019 Name: Sharon Henderson MRN: [id number] DOB: Nov 04, 1940  Referred by: Olin Hauser, DO Reason for referral : Chronic Care Management (Patient Phone Call)   Sharon Henderson is a 78 y.o. year old female who is a primary care patient of Olin Hauser, DO. The CCM team was consulted for assistance with chronic disease management and care coordination needs by patient's health plan.   I reached out to [employer] by phone today.   Review of patient status, including review of consultants reports, relevant laboratory and other test results, and collaboration with appropriate care team members and the patient's provider was performed as part of comprehensive patient evaluation and provision of chronic care management services.     Outpatient Encounter Medications as of 07/04/2019  Medication Sig  . budesonide-formoterol (SYMBICORT) 160-4.5 MCG/ACT inhaler INHALE 2 INHALATIONS INTO THE LUNGS 2 (TWO) TIMES DAILY.  . cetirizine (ZYRTEC) 10 MG tablet TAKE ONE TABLET BY MOUTH DAILY  . furosemide (LASIX) 40 MG tablet TAKE ONE TABLET BY MOUTH TWICE DAILY. If worsening swelling, may take 1 additional tab in afternoon.  Sharon Henderson losartan (COZAAR) 100 MG tablet TAKE ONE TABLET BY MOUTH DAILY  . montelukast (SINGULAIR) 10 MG tablet TAKE ONE TABLET BY MOUTH IN THE EVENING for allergies  . tiotropium (SPIRIVA HANDIHALER) 18 MCG inhalation capsule Place 18 mcg into inhaler and inhale at bedtime.   Sharon Henderson albuterol (VENTOLIN HFA) 108 (90 Base) MCG/ACT inhaler Inhale 1-2 puffs into the lungs every 6 (six) hours as needed.   . diclofenac sodium (VOLTAREN) 1 % GEL APPLY TWO GRAMS TOPICALLY THREE TIMES DAILY AS NEEDED FOR LEFT KNEE ARTHRITIS  . metFORMIN (GLUCOPHAGE) 500 MG tablet TAKE ONE TABLET BY MOUTH DAILY WITH BREAKFAST  . metoprolol tartrate (LOPRESSOR) 100 MG tablet TAKE ONE TABLET BY MOUTH TWICE DAILY  . Multiple Vitamins-Minerals (CENTRUM  SILVER 50+WOMEN PO) Take by mouth.  . simvastatin (ZOCOR) 10 MG tablet TAKE ONE TABLET BY MOUTH AT BEDTIME   No facility-administered encounter medications on file as of 07/04/2019.     Goals Addressed            This Visit's Progress   . PharmD-Medication Management       Current Barriers:  Sharon Henderson Knowledge Deficits related to indications for her medications . Limited eye sight - Reports needing new glasses . Limited social support - Currently lives in assisted living facility, but reports needing more assistance  Pharmacist Clinical Goal(s):  Sharon Henderson Over the next 30 days, patient will work with CM Pharmacist and PCP to address needs related to medication adherence, coordination of care and medication regimen optimization  Interventions: . Performed chart review o Patient seen by PCP on 9/15  - Lab work from 9/15 showed elevated serum creatinine (1.76 mg/dL); Calculate CrCl: ~30 mL/min (based on adjusted body weight) o Per 9/21 telephone note, patient advised to: - Reduce Lasix dose (40mg  only 1 pill a day if possible, if she needs she can take 2 in 24 hours - either 2 at once or 1 twice a day - max) - Hold losartan 100 mg for 2 weeks and then resume - Hold any NSAIDs - Use topical diclofenac cream for arthritis and Tylenol 500 mg - take 1 to 2 tabs per dose (max 1000mg ) every 6-8 hours for pain - May repeat BMET within next 2-4 weeks o Per 10/5 note, patient instructed to restart losartan . Reach Ms. Rehfeldt today  o Patient confirms that she has restarted taking losartan 100 mg daily as directed o Reports that she is using acetaminophen, rather than NSAIDs, as advised by PCP o Confirms taking furosemide as directed by PCP . Counsel on importance of medication adherence o Ms. Rapozo reports using Symbicort inhaler twice daily (and rinsing out mouth) as directed and Spiriva inhaler once each evening as directed o Receives her medications pill packed by [facility] o Patient reports continued  difficulty with seeing the medications. Typically takes medication directly from package as filled by pharmacy - Notes both montelukast and cetirizine included in pill packaging based on package labeling . Renal dose adjustment o For CrCl 11 to ?31 mL/minute: Recommend dose adjustment of cetirizine to 5 mg once daily o Note patient's last calculated CrCl falls right on the border for this adjustment. Based on patient's limited ability to visualize her medications to make adjustments, will follow for next renal function lab result, rather than recommend patient make adjustment at this time . Again encourage patient to call to schedule eye exam. Again provide patient phone number for [facility] o Ms. Vest calls today and makes next available appointment for eye exam, in January.  Patient Self Care Activities:  . Calls pharmacy for medication refills . Calls provider office for new concerns or questions o Appointment with [facility] in January  Please see past updates related to this goal by clicking on the "Past Updates" button in the selected goal         Plan  The care management team will reach out to the patient again over the next 30 days.   Duanne Moron, PharmD, [facility] Clinical Pharmacist [facility] [employer] [phone]

## 2019-07-19 ENCOUNTER — Other Ambulatory Visit: Payer: Medicare HMO

## 2019-07-19 DIAGNOSIS — N183 Chronic kidney disease, stage 3 unspecified: Secondary | ICD-10-CM | POA: Diagnosis not present

## 2019-07-19 DIAGNOSIS — I129 Hypertensive chronic kidney disease with stage 1 through stage 4 chronic kidney disease, or unspecified chronic kidney disease: Secondary | ICD-10-CM | POA: Diagnosis not present

## 2019-07-20 LAB — BASIC METABOLIC PANEL WITH GFR
BUN/Creatinine Ratio: 17 (calc) (ref 6–22)
BUN: 30 mg/dL — ABNORMAL HIGH (ref 7–25)
CO2: 29 mmol/L (ref 20–32)
Calcium: 9.6 mg/dL (ref 8.6–10.4)
Chloride: 102 mmol/L (ref 98–110)
Creat: 1.73 mg/dL — ABNORMAL HIGH (ref 0.60–0.93)
GFR, Est African American: 32 mL/min/{1.73_m2} — ABNORMAL LOW (ref >=60)
GFR, Est Non African American: 28 mL/min/{1.73_m2} — ABNORMAL LOW (ref >=60)
Glucose, Bld: 142 mg/dL — ABNORMAL HIGH (ref 65–99)
Potassium: 4.4 mmol/L (ref 3.5–5.3)
Sodium: 141 mmol/L (ref 135–146)

## 2019-07-22 ENCOUNTER — Ambulatory Visit: Payer: Self-pay | Admitting: Pharmacist

## 2019-07-22 ENCOUNTER — Other Ambulatory Visit: Payer: Self-pay | Admitting: Family Medicine

## 2019-07-22 DIAGNOSIS — J302 Other seasonal allergic rhinitis: Secondary | ICD-10-CM

## 2019-07-22 DIAGNOSIS — I129 Hypertensive chronic kidney disease with stage 1 through stage 4 chronic kidney disease, or unspecified chronic kidney disease: Secondary | ICD-10-CM

## 2019-07-22 DIAGNOSIS — N183 Chronic kidney disease, stage 3 unspecified: Secondary | ICD-10-CM

## 2019-07-22 MED ORDER — CETIRIZINE HCL 5 MG PO TABS: 5.0000 mg | ORAL_TABLET | Freq: Every day | ORAL | 3 refills | Status: DC

## 2019-07-22 NOTE — Chronic Care Management (AMB) (Signed)
Chronic Care Management   Follow Up Note   07/22/2019 Name: Sharon Henderson MRN: [id number] DOB: 06/29/1941  Referred by: Sharon Henderson Reason for referral : Chronic Care Management (Patient Phone Call)   Sharon Henderson is a 78 y.o. year old female who is a primary care patient of Sharon Henderson. The CCM team was consulted for assistance with chronic disease management and care coordination needs.    I reached out to Sharon Henderson by phone today.   Review of patient status, including review of consultants reports, relevant laboratory and other test results, and collaboration with appropriate care team members and the patient's provider was performed as part of comprehensive patient evaluation and provision of chronic care management services.     Outpatient Encounter Medications as of 07/22/2019  Medication Sig  . albuterol (VENTOLIN HFA) 108 (90 Base) MCG/ACT inhaler Inhale 1-2 puffs into the lungs every 6 (six) hours as needed.   . budesonide-formoterol (SYMBICORT) 160-4.5 MCG/ACT inhaler INHALE 2 INHALATIONS INTO THE LUNGS 2 (TWO) TIMES DAILY.  . cetirizine (ZYRTEC) 10 MG tablet TAKE ONE TABLET BY MOUTH DAILY  . diclofenac sodium (VOLTAREN) 1 % GEL APPLY TWO GRAMS TOPICALLY THREE TIMES DAILY AS NEEDED FOR LEFT KNEE ARTHRITIS  . furosemide (LASIX) 40 MG tablet TAKE ONE TABLET BY MOUTH TWICE DAILY. If worsening swelling, may take 1 additional tab in afternoon.  [name] losartan (COZAAR) 100 MG tablet TAKE ONE TABLET BY MOUTH DAILY  . metFORMIN (GLUCOPHAGE) 500 MG tablet TAKE ONE TABLET BY MOUTH DAILY WITH BREAKFAST  . metoprolol tartrate (LOPRESSOR) 100 MG tablet TAKE ONE TABLET BY MOUTH TWICE DAILY  . montelukast (SINGULAIR) 10 MG tablet TAKE ONE TABLET BY MOUTH IN THE EVENING for allergies  . Multiple Vitamins-Minerals (CENTRUM SILVER 50+WOMEN PO) Take by mouth.  . simvastatin (ZOCOR) 10 MG tablet TAKE ONE TABLET BY MOUTH AT BEDTIME  . tiotropium (SPIRIVA  HANDIHALER) 18 MCG inhalation capsule Place 18 mcg into inhaler and inhale at bedtime.    No facility-administered encounter medications on file as of 07/22/2019.     Goals Addressed            This Visit's Progress   . PharmD-Medication Management       Current Barriers:  [name] Knowledge Deficits related to indications for her medications . Limited eye sight - Reports needing new glasses . Limited social support - Currently lives in assisted living facility, but reports needing more assistance  Pharmacist Clinical Goal(s):  [name] Over the next 30 days, patient will work with CM Pharmacist and PCP to address needs related to medication adherence, coordination of care and medication regimen optimization  Interventions: . Performed chart review o Based on labs from 10/23, renal function still elevated. Creatinine: 1.73 mg/dL; Calculate CrCl: ~30 mL/min (based on adjusted body weight) o In note with lab work, PCP states "would recommend next step referral / consultation with a Kidney Specialist - if she is interested then I will go ahead and place order, they will contact her with apt" . Follow up with Sharon Henderson regarding Nephrology referral.  o Note Sharon Henderson declined referral yesterday when called with results by Sharon Henderson.  o States that now having chance to think/talk further, asks that PCP send Nephrology referral  . Renal dose adjustment o For CrCl 11 to ?31 mL/minute: Recommend dose adjustment of cetirizine to 5 mg once daily - Counsel patient on renal function and impact on cetirizine dosing - Will ask  PCP to consider reducing cetirizine dose to 5 mg once daily - to send new prescription to Sharon Henderson for dose to be updated with next set of pill packs going to patient.  Patient Self Care Activities:  . Calls pharmacy for medication refills . Calls provider office for new concerns or questions o Appointment with Sharon Henderson in January  Please see past updates related to this  goal by clicking on the "Past Updates" button in the selected goal         Plan  The care management team will reach out to the patient again over the next 30 days.   Sharon Henderson, PharmD, [place] [employer] [phone]

## 2019-07-22 NOTE — Addendum Note (Signed)
Addended by: Olin Hauser on: 07/22/2019 05:23 PM   Modules accepted: Orders

## 2019-07-22 NOTE — Patient Instructions (Signed)
Thank you allowing the Chronic Care Management Team to be a part of your care! It was a pleasure speaking with you today!     CCM (Chronic Care Management) Team    [name] RN, BSN Nurse Care Coordinator  [phone]   [name] PharmD  Clinical Pharmacist  [phone]   Eula Fried LCSW Clinical Social Worker [phone]  Visit Information  Goals Addressed            This Visit's Progress   . PharmD-Medication Management       Current Barriers:  Sharon Henderson Sharon Henderson Knowledge Deficits related to indications for her medications . Limited eye sight - Reports needing new glasses . Limited social support - Currently lives in assisted living facility, but reports needing more assistance  Pharmacist Clinical Goal(s):  Sharon Henderson Sharon Henderson Over the next 30 days, patient will work with CM Pharmacist and PCP to address needs related to medication adherence, coordination of care and medication regimen optimization  Interventions: . Performed chart review o Based on labs from 10/23, renal function still elevated. Creatinine: 1.73 mg/dL; Calculate CrCl: ~30 mL/min (based on adjusted body weight) . Follow up with [name] regarding Nephrology referral.  o States that now having chance to think/talk further, asks that PCP send Nephrology referral  . Renal dose adjustment o For CrCl 11 to ?31 mL/minute: Recommend dose adjustment of cetirizine to 5 mg once daily - Counsel patient on renal function and impact on cetirizine dosing - Will ask PCP to consider reducing cetirizine dose to 5 mg once daily - to send new prescription to [place] for dose to be updated with next set of pill packs going to patient.  Patient Self Care Activities:  . Calls pharmacy for medication refills . Calls provider office for new concerns or questions o Appointment with [facility] in January  Please see past updates related to this goal by clicking on the "Past Updates" button in the selected goal          The patient verbalized understanding of instructions provided today and declined a print copy of patient instruction materials.   The care management team will reach out to the patient again over the next 30 days.   [name], PharmD, [place] [employer] [phone]

## 2019-07-29 ENCOUNTER — Ambulatory Visit: Payer: Medicare HMO | Admitting: Podiatry

## 2019-08-12 ENCOUNTER — Other Ambulatory Visit: Payer: Self-pay | Admitting: Nephrology

## 2019-08-12 DIAGNOSIS — I10 Essential (primary) hypertension: Secondary | ICD-10-CM | POA: Diagnosis not present

## 2019-08-12 DIAGNOSIS — E1122 Type 2 diabetes mellitus with diabetic chronic kidney disease: Secondary | ICD-10-CM | POA: Diagnosis not present

## 2019-08-12 DIAGNOSIS — N1832 Chronic kidney disease, stage 3b: Secondary | ICD-10-CM | POA: Diagnosis not present

## 2019-08-16 ENCOUNTER — Ambulatory Visit: Payer: Self-pay | Admitting: Pharmacist

## 2019-08-16 NOTE — Patient Instructions (Signed)
Thank you allowing the Chronic Care Management Team to be a part of your care! It was a pleasure speaking with you today!     CCM (Chronic Care Management) Team    [name] RN, BSN Nurse Care Coordinator  [phone]   [name] PharmD  Clinical Pharmacist  [phone]   Eula Fried LCSW Clinical Social Worker [phone]  Visit Information  Goals Addressed            This Visit's Progress   . PharmD-Medication Management       Current Barriers:  [name] Knowledge Deficits related to indications for her medications . Limited eye sight - Reports needing new glasses . Limited social support - Currently lives in assisted living facility, but reports needing more assistance  Pharmacist Clinical Goal(s):  [name] Over the next 30 days, patient will work with CM Pharmacist and PCP to address needs related to medication adherence, coordination of care and medication regimen optimization  Interventions: . Perform chart review o Note patient seen by Nephrologist, as referred by PCP, on 11/16 . Outreach call to Ms. Cabeza. Will reschedule as requested by patient. Reports she has relatives visiting today.  Patient Self Care Activities:  . Calls pharmacy for medication refills . Calls provider office for new concerns or questions o Appointment with [facility] in January  Please see past updates related to this goal by clicking on the "Past Updates" button in the selected goal         The patient verbalized understanding of instructions provided today and declined a print copy of patient instruction materials.   The care management team will reach out to the patient again over the next 30 days.   [name], PharmD, [place] [employer] [phone]

## 2019-08-16 NOTE — Chronic Care Management (AMB) (Addendum)
Chronic Care Management   Follow Up Note   08/16/2019 Name: Sharon Henderson MRN: [id number] DOB: 12/17/1940  Referred by: Olin Hauser, DO Reason for referral : Chronic Care Management (Patient Phone Call) and Sharon Henderson is a 78 y.o. year old female who is a primary care patient of Olin Hauser, DO. The CCM team was consulted for assistance with chronic disease management and care coordination needs.    I reached out to [employer] by phone today.   Review of patient status, including review of consultants reports, relevant laboratory and other test results, and collaboration with appropriate care team members and the patient's provider was performed as part of comprehensive patient evaluation and provision of chronic care management services.     Outpatient Encounter Medications as of 08/16/2019  Medication Sig  . albuterol (VENTOLIN HFA) 108 (90 Base) MCG/ACT inhaler Inhale 1-2 puffs into the lungs every 6 (six) hours as needed.   . budesonide-formoterol (SYMBICORT) 160-4.5 MCG/ACT inhaler INHALE 2 INHALATIONS INTO THE LUNGS 2 (TWO) TIMES DAILY.  . cetirizine (ZYRTEC) 5 MG tablet Take 1 tablet (5 mg total) by mouth daily.  . diclofenac sodium (VOLTAREN) 1 % GEL APPLY TWO GRAMS TOPICALLY THREE TIMES DAILY AS NEEDED FOR LEFT KNEE ARTHRITIS  . furosemide (LASIX) 40 MG tablet TAKE ONE TABLET BY MOUTH TWICE DAILY. If worsening swelling, may take 1 additional tab in afternoon.  [name] losartan (COZAAR) 100 MG tablet TAKE ONE TABLET BY MOUTH DAILY  . metFORMIN (GLUCOPHAGE) 500 MG tablet TAKE ONE TABLET BY MOUTH DAILY WITH BREAKFAST  . metoprolol tartrate (LOPRESSOR) 100 MG tablet TAKE ONE TABLET BY MOUTH TWICE DAILY  . montelukast (SINGULAIR) 10 MG tablet TAKE ONE TABLET BY MOUTH IN THE EVENING for allergies  . Multiple Vitamins-Minerals (CENTRUM SILVER 50+WOMEN PO) Take by mouth.  . simvastatin (ZOCOR) 10 MG tablet TAKE ONE TABLET BY MOUTH AT  BEDTIME  . tiotropium (SPIRIVA HANDIHALER) 18 MCG inhalation capsule Place 18 mcg into inhaler and inhale at bedtime.    No facility-administered encounter medications on file as of 08/16/2019.     Goals Addressed            This Visit's Progress   . PharmD-Medication Management       Current Barriers:  [name] Knowledge Deficits related to indications for her medications . Limited eye sight - Reports needing new glasses . Limited social support - Currently lives in assisted living facility, but reports needing more assistance  Pharmacist Clinical Goal(s):  [name] Over the next 30 days, patient will work with CM Pharmacist and PCP to address needs related to medication adherence, coordination of care and medication regimen optimization  Interventions: . Perform chart review o Note patient seen by Nephrologist, as referred by PCP, on 11/16 - Also advised patient to avoid all use of NSAIDs - Patient to maintain adequate fluid hydration status - Ordered renal ultrasound and lab work - Follow up visit scheduled for 12/21 . Outreach call to Sharon Henderson. Will reschedule as requested by patient. Reports she has relatives visiting today.  Patient Self Care Activities:  . Calls pharmacy for medication refills . Calls provider office for new concerns or questions o Appointment with [facility] in January o Renal ultrasound scheduled for 12/2  Please see past updates related to this goal by clicking on the "Past Updates" button in the selected goal         Plan  The care management  team will reach out to the patient again over the next 30 days.   Duanne Moron, PharmD, [facility] Clinical Pharmacist [facility] [employer] [phone]

## 2019-08-28 ENCOUNTER — Ambulatory Visit: Payer: Medicare HMO

## 2019-09-02 ENCOUNTER — Telehealth: Payer: Self-pay | Admitting: Family Medicine

## 2019-09-02 NOTE — Telephone Encounter (Signed)
° ° °  Called pt regarding Insurance account manager for [employer]. She stated that she has [employer] and has been pretty satisfied with the coverage for Office Visits and her prescriptions. Patient is still able to drive safely but is interested in the [facility] transportation benefit, will mail her literature with 800 #, should she need it in the future.  She also asked about assistance with cleaning her home as she has COPD and has an issue with fumes from some cleaners, I asked her if she would like information on the [facility] program offered through [employer] and she agreed she would read it over to see if she was interested and she will follow up with me if she wants to be added to waiting list.  Will mail resource information in a letter to pt.    [facility] ??[email or web address]   ??[phone]

## 2019-09-04 ENCOUNTER — Other Ambulatory Visit: Payer: Self-pay | Admitting: Family Medicine

## 2019-09-04 ENCOUNTER — Ambulatory Visit (INDEPENDENT_AMBULATORY_CARE_PROVIDER_SITE_OTHER): Payer: Medicare HMO | Admitting: Pharmacist

## 2019-09-04 ENCOUNTER — Telehealth: Payer: Self-pay

## 2019-09-04 DIAGNOSIS — J449 Chronic obstructive pulmonary disease, unspecified: Secondary | ICD-10-CM

## 2019-09-04 DIAGNOSIS — N183 Chronic kidney disease, stage 3 unspecified: Secondary | ICD-10-CM

## 2019-09-04 MED ORDER — SPIRIVA HANDIHALER 18 MCG IN CAPS: 18.0000 ug | ORAL_CAPSULE | Freq: Every day | RESPIRATORY_TRACT | 0 refills | Status: DC

## 2019-09-04 MED ORDER — ALBUTEROL SULFATE HFA 108 (90 BASE) MCG/ACT IN AERS: 1.0000 | INHALATION_SPRAY | Freq: Four times a day (QID) | RESPIRATORY_TRACT | 0 refills | Status: DC | PRN

## 2019-09-04 MED ORDER — BUDESONIDE-FORMOTEROL FUMARATE 160-4.5 MCG/ACT IN AERO: INHALATION_SPRAY | RESPIRATORY_TRACT | 0 refills | Status: DC

## 2019-09-04 NOTE — Addendum Note (Signed)
Addended by: Olin Hauser on: 09/04/2019 03:11 PM   Modules accepted: Orders

## 2019-09-04 NOTE — Patient Instructions (Signed)
Thank you allowing the Chronic Care Management Team to be a part of your care! It was a pleasure speaking with you today!     CCM (Chronic Care Management) Team    [name] RN, BSN Nurse Care Coordinator  [phone]   [name] PharmD  Clinical Pharmacist  [phone]   Eula Fried LCSW Clinical Social Worker [phone]  Visit Information  Goals Addressed            This Visit's Progress   . PharmD-Medication Management       Current Barriers:  [name] Knowledge Deficits related to indications for her medications . Limited eye sight - Reports needing new glasses . Limited social support - Currently lives in assisted living facility, but reports needing more assistance  Pharmacist Clinical Goal(s):  [name] Over the next 30 days, patient will work with CM Pharmacist and PCP to address needs related to medication adherence, coordination of care and medication regimen optimization  Interventions: . Follow up with patient re: instructions from Nephrology appointment on 11/16 o Remind patient to avoid all use of NSAIDs o Remind patient to maintain adequate fluid hydration status o Note renal ultrasound ordered: Ms. Chim states that she will follow up with Nephrologist about having this completed . Counsel on medication adherence and address current medication barrier: patient reports she ran out of Symbicort inhaler and only has 4 puffs of albuterol inhaler remaining o Per medication dispensing history in chart, Symbicort last filled by [facility] on 9/15 for 90 day supply; refill prior on 7/6 for 90 day supply - Review with patient instructions to use 2 puffs twice daily as directed. Advise against using inhaler other than as directed. o Counsel patient on maintenance (Symbicort and Spiriva) vs rescue inhaler (albuterol) and how to use each o Confirms has looked around home for another Symbicort inhaler from last refill that is unused or with puffs  remaining. . Coordination of care with patient's PCP. Request prescriptions for Symbicort and [facility] . Coordination of care to [place]. Reports both Symbicort and albuterol inhaler billed through insurance and are being filled now. . Follow up call to Ms. Skalla.  o Patient states that she will send a friend to pick up her medications from [place] patient to follow up with [place] for next refills  Patient Self Care Activities:  . Calls pharmacy for medication refills . Calls provider office for new concerns or questions o Appointment with [facility] in January o Next appointment with Nephrologist on 12/21  Please see past updates related to this goal by clicking on the "Past Updates" button in the selected goal         The patient verbalized understanding of instructions provided today and declined a print copy of patient instruction materials.   Telephone follow up appointment with care management team member scheduled for: 1/6 at 1 pm  [name], PharmD, [place] [phone]

## 2019-09-04 NOTE — Chronic Care Management (AMB) (Signed)
Chronic Care Management   Follow Up Note   09/04/2019 Name: DESTENEE GUERRY MRN: [id number] DOB: 1940-12-14  Referred by: Smitty Cords, DO Reason for referral : Chronic Care Management (Patient Phone Call)   NIAH HEINLE is a 78 y.o. year old female who is a primary care patient of Smitty Cords, DO. The CCM team was consulted for assistance with chronic disease management and care coordination needs.    I reached out to [employer] by phone today.   [facility]  Review of patient status, including review of consultants reports, relevant laboratory and other test results, and collaboration with appropriate care team members and the patient's provider was performed as part of comprehensive patient evaluation and provision of chronic care management services.     Outpatient Encounter Medications as of 09/04/2019  Medication Sig  . albuterol (VENTOLIN HFA) 108 (90 Base) MCG/ACT inhaler Inhale 1-2 puffs into the lungs every 6 (six) hours as needed.  . tiotropium (SPIRIVA HANDIHALER) 18 MCG inhalation capsule Place 1 capsule (18 mcg total) into inhaler and inhale at bedtime.  . [DISCONTINUED] budesonide-formoterol (SYMBICORT) 160-4.5 MCG/ACT inhaler INHALE 2 INHALATIONS INTO THE LUNGS 2 (TWO) TIMES DAILY.  . [DISCONTINUED] tiotropium (SPIRIVA HANDIHALER) 18 MCG inhalation capsule Place 18 mcg into inhaler and inhale at bedtime.   . cetirizine (ZYRTEC) 5 MG tablet Take 1 tablet (5 mg total) by mouth daily.  . diclofenac sodium (VOLTAREN) 1 % GEL APPLY TWO GRAMS TOPICALLY THREE TIMES DAILY AS NEEDED FOR LEFT KNEE ARTHRITIS  . furosemide (LASIX) 40 MG tablet TAKE ONE TABLET BY MOUTH TWICE DAILY. If worsening swelling, may take 1 additional tab in afternoon.  [name] losartan (COZAAR) 100 MG tablet TAKE ONE TABLET BY MOUTH DAILY  . metFORMIN (GLUCOPHAGE) 500 MG tablet TAKE ONE TABLET BY MOUTH DAILY WITH BREAKFAST  . metoprolol tartrate  (LOPRESSOR) 100 MG tablet TAKE ONE TABLET BY MOUTH TWICE DAILY  . montelukast (SINGULAIR) 10 MG tablet TAKE ONE TABLET BY MOUTH IN THE EVENING for allergies  . Multiple Vitamins-Minerals (CENTRUM SILVER 50+WOMEN PO) Take by mouth.  . simvastatin (ZOCOR) 10 MG tablet TAKE ONE TABLET BY MOUTH AT BEDTIME  . [DISCONTINUED] albuterol (VENTOLIN HFA) 108 (90 Base) MCG/ACT inhaler Inhale 1-2 puffs into the lungs every 6 (six) hours as needed.    No facility-administered encounter medications on file as of 09/04/2019.     Goals Addressed            This Visit's Progress   . PharmD-Medication Management       Current Barriers:  [name] Knowledge Deficits related to indications for her medications . Limited eye sight - Reports needing new glasses . Limited social support - Currently lives in assisted living facility, but reports needing more assistance  Pharmacist Clinical Goal(s):  [name] Over the next 30 days, patient will work with CM Pharmacist and PCP to address needs related to medication adherence, coordination of care and medication regimen optimization  Interventions: . Follow up with patient re: instructions from Nephrology appointment on 11/16 o Remind patient to avoid all use of NSAIDs o Remind patient to maintain adequate fluid hydration status o Note renal ultrasound ordered: Ms. Brandner states that she will follow up with Nephrologist about having this completed . Counsel on medication adherence and address current medication barrier: patient reports she ran out of Symbicort inhaler and only has 4 puffs of albuterol inhaler remaining o Per medication dispensing history in chart,  Symbicort last filled by [facility] on 9/15 for 90 day supply; refill prior on 7/6 for 90 day supply - Review with patient instructions to use 2 puffs twice daily as directed. Advise against using inhaler other than as directed. o Counsel patient on maintenance (Symbicort and Spiriva) vs rescue inhaler (albuterol)  and how to use each o Confirms has looked around home for another Symbicort inhaler from last refill that is unused or with puffs remaining. . Coordination of care with patient's PCP. Request prescriptions for Symbicort and [facility] . Coordination of care to [place]. Reports both Symbicort and albuterol inhaler billed through insurance and are being filled now. . Follow up call to Ms. Michaelson.  o Patient states that she will send a friend to pick up her medications from [place] patient to follow up with [place] for next refills  Patient Self Care Activities:  . Calls pharmacy for medication refills . Calls provider office for new concerns or questions o Appointment with [facility] in January o Next appointment with Nephrologist on 12/21  Please see past updates related to this goal by clicking on the "Past Updates" button in the selected goal         Plan  Telephone follow up appointment with care management team member scheduled for: 1/6 at 1 pm  [name], PharmD, [place] [phone]

## 2019-09-06 ENCOUNTER — Other Ambulatory Visit: Payer: Self-pay

## 2019-09-06 ENCOUNTER — Other Ambulatory Visit: Payer: Self-pay | Admitting: Family Medicine

## 2019-09-06 DIAGNOSIS — R600 Localized edema: Secondary | ICD-10-CM

## 2019-09-16 ENCOUNTER — Other Ambulatory Visit: Payer: Self-pay | Admitting: Nephrology

## 2019-09-16 DIAGNOSIS — N2581 Secondary hyperparathyroidism of renal origin: Secondary | ICD-10-CM | POA: Diagnosis not present

## 2019-09-16 DIAGNOSIS — N1832 Chronic kidney disease, stage 3b: Secondary | ICD-10-CM

## 2019-09-16 DIAGNOSIS — I10 Essential (primary) hypertension: Secondary | ICD-10-CM | POA: Diagnosis not present

## 2019-09-16 DIAGNOSIS — D631 Anemia in chronic kidney disease: Secondary | ICD-10-CM | POA: Diagnosis not present

## 2019-09-16 DIAGNOSIS — E1122 Type 2 diabetes mellitus with diabetic chronic kidney disease: Secondary | ICD-10-CM | POA: Diagnosis not present

## 2019-09-30 ENCOUNTER — Other Ambulatory Visit: Payer: Self-pay | Admitting: Family Medicine

## 2019-09-30 DIAGNOSIS — J449 Chronic obstructive pulmonary disease, unspecified: Secondary | ICD-10-CM

## 2019-10-02 ENCOUNTER — Ambulatory Visit (INDEPENDENT_AMBULATORY_CARE_PROVIDER_SITE_OTHER): Payer: Medicare HMO | Admitting: Pharmacist

## 2019-10-02 DIAGNOSIS — J449 Chronic obstructive pulmonary disease, unspecified: Secondary | ICD-10-CM

## 2019-10-02 NOTE — Chronic Care Management (AMB) (Signed)
Chronic Care Management   Follow Up Note   10/02/2019 Name: Sharon Henderson MRN: [id number] DOB: 12-Oct-1940  Referred by: Smitty Cords, DO Reason for referral : Chronic Care Management (Patient Phone Call) and Care Coordination   Sharon Henderson is a 79 y.o. year old female who is a primary care patient of Smitty Cords, DO. The CCM team was consulted for assistance with chronic disease management and care coordination needs.    I reached out to [employer] by phone today.   Review of patient status, including review of consultants reports, relevant laboratory and other test results, and collaboration with appropriate care team members and the patient's provider was performed as part of comprehensive patient evaluation and provision of chronic care management services.     Outpatient Encounter Medications as of 10/02/2019  Medication Sig  . albuterol (VENTOLIN HFA) 108 (90 Base) MCG/ACT inhaler INHALE 1-2 PUFFS INTO THE LUNGS EVERY 6 (SIX) HOURS AS NEEDED.  . budesonide-formoterol (SYMBICORT) 160-4.5 MCG/ACT inhaler INHALE 2 INHALATIONS INTO THE LUNGS 2 (TWO) TIMES DAILY.  [name] tiotropium (SPIRIVA HANDIHALER) 18 MCG inhalation capsule Place 1 capsule (18 mcg total) into inhaler and inhale at bedtime.  . cetirizine (ZYRTEC) 5 MG tablet Take 1 tablet (5 mg total) by mouth daily.  . diclofenac sodium (VOLTAREN) 1 % GEL APPLY TWO GRAMS TOPICALLY THREE TIMES DAILY AS NEEDED FOR LEFT KNEE ARTHRITIS  . furosemide (LASIX) 40 MG tablet TAKE ONE TABLET BY MOUTH TWICE DAILY. If worsening swelling, may take 1 additional tab in afternoon.  [name] losartan (COZAAR) 100 MG tablet TAKE ONE TABLET BY MOUTH DAILY  . metFORMIN (GLUCOPHAGE) 500 MG tablet TAKE ONE TABLET BY MOUTH DAILY WITH BREAKFAST  . metoprolol tartrate (LOPRESSOR) 100 MG tablet TAKE ONE TABLET BY MOUTH TWICE DAILY  . montelukast (SINGULAIR) 10 MG tablet TAKE ONE TABLET BY MOUTH IN THE EVENING for allergies  . Multiple  Vitamins-Minerals (CENTRUM SILVER 50+WOMEN PO) Take by mouth.  . simvastatin (ZOCOR) 10 MG tablet TAKE ONE TABLET BY MOUTH AT BEDTIME   No facility-administered encounter medications on file as of 10/02/2019.    Goals Addressed            This Visit's Progress   . PharmD-Medication Management       Current Barriers:  [name] Knowledge Deficits related to indications for her medications . Limited eye sight - Reports needing new glasses  . Limited social support - Currently lives in assisted living facility, but reports needing more assistance  Pharmacist Clinical Goal(s):  [name] Over the next 30 days, patient will work with CM Pharmacist and PCP to address needs related to medication adherence, coordination of care and medication regimen optimization  Interventions: . Review with patient difference between maintenance (Symbicort and Spiriva) vs rescue inhalers (albuterol), how to use each and how to recognize the difference. Counsel patient on rinsing mouth out after each use of Symbicort. . Coordination of care with patient on the line to [facility] o Ms. Flynn reports that she is unable to recall the date of her upcoming eye exam o Note patient has complained of difficulty vision and needing new glasses  o Representative confirms appointment scheduled for 1/26 at 3 pm o Patient confirms having transportation for this appointment . Patient asks about light housecleaning resources that could clean safely during COVID-19 pandemic o Review chart - Care Guide Manuela Schwartz sent patient a letter on 12/7 with a list of resources for light cleaning o  Ms. Mahone finds this letter now and confirms that she is able to read the print.  o Advise patient to follow up with Curt Bears for further information.   Patient Self Care Activities:  . Calls pharmacy for medication refills . Calls provider office for new concerns or questions o Appointment with [facility] in 1/26 at 3 pm  Please see  past updates related to this goal by clicking on the "Past Updates" button in the selected goal         Plan  Telephone follow up appointment with care management team member scheduled for: 1/28 at [place], PharmD, [place] [phone]

## 2019-10-02 NOTE — Patient Instructions (Signed)
Thank you allowing the Chronic Care Management Team to be a part of your care! It was a pleasure speaking with you today!     CCM (Chronic Care Management) Team    [name] RN, BSN Nurse Care Coordinator  [phone]   Duanne Moron PharmD  Clinical Pharmacist  [phone]   Dickie La LCSW Clinical Social Worker [phone]  Visit Information  Goals Addressed            This Visit's Progress   . PharmD-Medication Management       Current Barriers:  [name] Knowledge Deficits related to indications for her medications . Limited eye sight - Reports needing new glasses  . Limited social support - Currently lives in assisted living facility, but reports needing more assistance  Pharmacist Clinical Goal(s):  [name] Over the next 30 days, patient will work with CM Pharmacist and PCP to address needs related to medication adherence, coordination of care and medication regimen optimization  Interventions: . Review with patient difference between maintenance (Symbicort and Spiriva) vs rescue inhalers (albuterol), how to use each and how to recognize the difference. Counsel patient on rinsing mouth out after each use of Symbicort. . Coordination of care with patient on the line to [facility] o Ms. Stachnik reports that she is unable to recall the date of her upcoming eye exam o Note patient has complained of difficulty vision and needing new glasses  o Representative confirms appointment scheduled for 1/26 at 3 pm o Patient confirms having transportation for this appointment . Patient asks about light housecleaning resources that could clean safely during COVID-19 pandemic o Review chart - Care Guide Manuela Schwartz sent patient a letter on 12/7 with a list of resources for light cleaning o Ms. Garramone finds this letter now and confirms that she is able to read the print.  o Advise patient to follow up with Samara Deist for further information.   Patient Self Care Activities:   . Calls pharmacy for medication refills . Calls provider office for new concerns or questions o Appointment with [facility] in 1/26 at 3 pm  Please see past updates related to this goal by clicking on the "Past Updates" button in the selected goal         The patient verbalized understanding of instructions provided today and declined a print copy of patient instruction materials.   Telephone follow up appointment with care management team member scheduled for: 1/28 at 1pm  Duanne Moron, PharmD, [facility] Clinical Pharmacist [facility] [phone]

## 2019-10-08 ENCOUNTER — Other Ambulatory Visit: Payer: Self-pay | Admitting: Family Medicine

## 2019-10-08 DIAGNOSIS — R600 Localized edema: Secondary | ICD-10-CM

## 2019-10-22 ENCOUNTER — Telehealth: Payer: Self-pay

## 2019-10-22 ENCOUNTER — Telehealth: Payer: Self-pay | Admitting: Family Medicine

## 2019-10-22 DIAGNOSIS — M1732 Unilateral post-traumatic osteoarthritis, left knee: Secondary | ICD-10-CM

## 2019-10-22 MED ORDER — DICLOFENAC SODIUM 1 % EX GEL: 2.0000 g | Freq: Three times a day (TID) | CUTANEOUS | 3 refills | Status: DC | PRN

## 2019-10-22 NOTE — Telephone Encounter (Signed)
Nisha, Could you please call Ms. Headings at [phone] (H) regarding the following two Rx?  albuterol (VENTOLIN HFA) 108 (90 Base) MCG/ACT inhaler [[id number]]  budesonide-formoterol (SYMBICORT) 160-4.5 MCG/ACT inhaler [[id number]]   She stated that these two rx did not arrive by mail again this month from [facility] - [place], [place] - [address]  [address], [place] [address] Phone:  [phone]   It looks [facility] and I wasn't sure if she had to go through those two first before it could be changed to mail order. but she would prefer to get by mail from [facility].   Thank you for your help!  Manuela Schwartz  Care Guide . Embedded Care Coordination [facility]  Care Management ??[email or web address]  ??[phone]

## 2019-10-22 NOTE — Telephone Encounter (Signed)
Ordered Diclofenac gel topical L knee pain arthritis 300g for 90 day to [facility] mail  Saralyn Pilar, DO [facility] 10/22/2019, 11:55 AM

## 2019-10-22 NOTE — Telephone Encounter (Signed)
Informed the patient to call the pharmacy since the Rx was send in 09/29/2018.

## 2019-10-24 ENCOUNTER — Ambulatory Visit: Payer: Self-pay | Admitting: Pharmacist

## 2019-10-24 DIAGNOSIS — J449 Chronic obstructive pulmonary disease, unspecified: Secondary | ICD-10-CM

## 2019-10-24 NOTE — Chronic Care Management (AMB) (Signed)
Chronic Care Management   Follow Up Note   10/24/2019 Name: Sharon Henderson MRN: [id number] DOB: 1941-04-21  Referred by: Olin Hauser, DO Reason for referral : Chronic Care Management (Patient Phone Call)   Sharon Henderson is a 79 y.o. year old female who is a primary care patient of Olin Hauser, DO. The CCM team was consulted for assistance with chronic disease management and care coordination needs.    I reached out to [employer] by phone today.   Review of patient status, including review of consultants reports, relevant laboratory and other test results, and collaboration with appropriate care team members and the patient's provider was performed as part of comprehensive patient evaluation and provision of chronic care management services.     Outpatient Encounter Medications as of 10/24/2019  Medication Sig  . albuterol (VENTOLIN HFA) 108 (90 Base) MCG/ACT inhaler INHALE 1-2 PUFFS INTO THE LUNGS EVERY 6 (SIX) HOURS AS NEEDED.  . budesonide-formoterol (SYMBICORT) 160-4.5 MCG/ACT inhaler INHALE 2 INHALATIONS INTO THE LUNGS 2 (TWO) TIMES DAILY.  [name] tiotropium (SPIRIVA HANDIHALER) 18 MCG inhalation capsule Place 1 capsule (18 mcg total) into inhaler and inhale at bedtime.  . cetirizine (ZYRTEC) 5 MG tablet Take 1 tablet (5 mg total) by mouth daily.  . diclofenac Sodium (VOLTAREN) 1 % GEL Apply 2 g topically 3 (three) times daily as needed. For left knee arthritis pain  . furosemide (LASIX) 40 MG tablet TAKE ONE TABLET BY MOUTH TWICE DAILY. If worsening swelling, may take 1 additional tab in afternoon.  [name] losartan (COZAAR) 100 MG tablet TAKE ONE TABLET BY MOUTH DAILY  . metFORMIN (GLUCOPHAGE) 500 MG tablet TAKE ONE TABLET BY MOUTH DAILY WITH BREAKFAST  . metoprolol tartrate (LOPRESSOR) 100 MG tablet TAKE ONE TABLET BY MOUTH TWICE DAILY  . montelukast (SINGULAIR) 10 MG tablet TAKE ONE TABLET BY MOUTH IN THE EVENING for allergies  . Multiple Vitamins-Minerals  (CENTRUM SILVER 50+WOMEN PO) Take by mouth.  . simvastatin (ZOCOR) 10 MG tablet TAKE ONE TABLET BY MOUTH AT BEDTIME   No facility-administered encounter medications on file as of 10/24/2019.    Goals Addressed            This Visit's Progress   . PharmD-Medication Management       Current Barriers:  [name] Knowledge Deficits related to indications for her medications . Limited eye sight - Reports needing new glasses  . Limited social support - Currently lives in assisted living facility, but reports needing more assistance  Pharmacist Clinical Goal(s):  [name] Over the next 30 days, patient will work with CM Pharmacist and PCP to address needs related to medication adherence, coordination of care and medication regimen optimization  Interventions: . Perform chart review - note patient spoke with Care Guide on 1/26 regarding need for refills of albuterol and Symbicort inhalers . Follow up with patient regarding refills of inhalers o Reports picked up [facility] o Reports called [facility] last week about obtaining a refill of Symbicort, but has not yet received it - Per dispensing report from chart. [facility] mail order filled a 90 day supply of Symbicort on 1/22 - Have patient review dose counter on Symbicort. Reports having 30 puffs (7 days) remaining. - Educate patient about process of obtaining a refill [facility] if mail order supply does not arrive in time (override through [facility] available if this occurs) . Review with patient difference between maintenance (Symbicort  and Spiriva) vs rescue inhalers (albuterol), how to use each and how to recognize the difference. Counsel patient on rinsing mouth out after each use of Symbicort. . Coordination of care - Provide patient with phone number for [facility] to reschedue appointment o Reports missed eye exam appointment this week and had misplaced phone number . Ms.  Rebuck reports that she is scheduled through her assisted living facility to receive COVID-19 vaccination in February  Patient Self Care Activities:  . Calls pharmacy for medication refills . Calls provider office for new concerns or questions  Please see past updates related to this goal by clicking on the "Past Updates" button in the selected goal         Plan  Telephone follow up appointment with care management team member scheduled for: 2/3 at 1 pm  Duanne Moron, PharmD, [facility] Clinical Pharmacist [facility] [employer] [phone]

## 2019-10-24 NOTE — Patient Instructions (Signed)
Thank you allowing the Chronic Care Management Team to be a part of your care! It was a pleasure speaking with you today!     CCM (Chronic Care Management) Team    Alto Denver RN, MSN, CCM Nurse Care Coordinator  [phone]   Duanne Moron PharmD  Clinical Pharmacist  [phone]   Dickie La LCSW Clinical Social Worker [phone]  Visit Information  Goals Addressed            This Visit's Progress   . PharmD-Medication Management       Current Barriers:  [name] Knowledge Deficits related to indications for her medications . Limited eye sight - Reports needing new glasses  . Limited social support - Currently lives in assisted living facility, but reports needing more assistance  Pharmacist Clinical Goal(s):  [name] Over the next 30 days, patient will work with CM Pharmacist and PCP to address needs related to medication adherence, coordination of care and medication regimen optimization  Interventions: . Perform chart review - note patient spoke with Care Guide on 1/26 regarding need for refills of albuterol and Symbicort inhalers . Follow up with patient regarding refills of inhalers o Reports picked up [facility]ill of [facility] o Reports called [facility] last week about obtaining a refill of Symbicort, but has not yet received it - Per dispensing report from chart. [facility] mail order filled a 90 day supply of Symbicort on 1/22 - Have patient review dose counter on Symbicort. Reports having 30 puffs (7 days) remaining. - Educate patient about process of obtaining a [facility] if mail order supply does not arrive in time (override through [facility] available if this occurs) . Review with patient difference between maintenance (Symbicort and Spiriva) vs rescue inhalers (albuterol), how to use each and how to recognize the difference. Counsel patient on rinsing mouth out after each use of  Symbicort. . Coordination of care - Provide patient with phone number for [facility] to reschedue appointment o Reports missed eye exam appointment this week and had misplaced phone number . Ms. Paulson reports that she is scheduled through her assisted living facility to receive COVID-19 vaccination in February  Patient Self Care Activities:  . Calls pharmacy for medication refills . Calls provider office for new concerns or questions  Please see past updates related to this goal by clicking on the "Past Updates" button in the selected goal         The patient verbalized understanding of instructions provided today and declined a print copy of patient instruction materials.   Telephone follow up appointment with care management team member scheduled for: 2/3  Duanne Moron, PharmD, [facility] Clinical Pharmacist [facility] [employer] [phone]

## 2019-10-29 ENCOUNTER — Other Ambulatory Visit: Payer: Self-pay | Admitting: Family Medicine

## 2019-10-29 DIAGNOSIS — J449 Chronic obstructive pulmonary disease, unspecified: Secondary | ICD-10-CM

## 2019-10-29 MED ORDER — ALBUTEROL SULFATE HFA 108 (90 BASE) MCG/ACT IN AERS: 1.0000 | INHALATION_SPRAY | Freq: Four times a day (QID) | RESPIRATORY_TRACT | 2 refills | Status: DC | PRN

## 2019-10-30 ENCOUNTER — Ambulatory Visit: Payer: Self-pay | Admitting: Pharmacist

## 2019-10-30 ENCOUNTER — Telehealth: Payer: Self-pay

## 2019-10-31 NOTE — Chronic Care Management (AMB) (Signed)
  Chronic Care Management   Follow Up Note   10/30/2019 Name: Sharon Henderson MRN: [id number] DOB: Jun 08, 1941  Referred by: Smitty Cords, DO Reason for referral : Chronic Care Management (Patient Phone Call)   Sharon Henderson is a 79 y.o. year old female who is a primary care patient of Smitty Cords, DO. The CCM team was consulted for assistance with chronic disease management and care coordination needs.    Was unable to reach patient via telephone today and unable to leave a message as no voicemail picks up.  Plan  The care management team will reach out to the patient again over the next 30 days.   Duanne Moron, PharmD, [facility] Clinical Pharmacist [facility] [employer] [phone]

## 2019-11-08 ENCOUNTER — Ambulatory Visit: Payer: Self-pay | Admitting: Pharmacist

## 2019-11-08 DIAGNOSIS — J449 Chronic obstructive pulmonary disease, unspecified: Secondary | ICD-10-CM

## 2019-11-08 NOTE — Patient Instructions (Signed)
Thank you allowing the Chronic Care Management Team to be a part of your care! It was a pleasure speaking with you today!     CCM (Chronic Care Management) Team    Alto Denver RN, MSN, CCM Nurse Care Coordinator  [phone]   Duanne Moron PharmD  Clinical Pharmacist  [phone]   Dickie La LCSW Clinical Social Worker [phone]  Visit Information  Goals Addressed            This Visit's Progress   . PharmD-Medication Management       Current Barriers:  [name] Knowledge Deficits related to indications for her medications . Limited eye sight - Reports needing new glasses   . Limited social support - Currently lives in assisted living facility, but reports needing more assistance  Pharmacist Clinical Goal(s):  [name] Over the next 30 days, patient will work with CM Pharmacist and PCP to address needs related to medication adherence, coordination of care and medication regimen optimization  Interventions: . Follow up with patient regarding refill of inhaler o Confirms received Symbicort inhalers in mail from [employer]  . Review again with patient difference between maintenance (Symbicort and Spiriva) vs rescue inhalers (albuterol), how to use each and how to recognize the difference. Counsel patient on rinsing mouth out after each use of Symbicort. . Coordination of care - follow up regarding appointment with [facility] o Reports did reschedule eye appointment but missed appointment again. o States will call to reschedule eye appointment now. Offer to make phone number with patient, but she declines. Confirms having phone number. o Discuss strategy to help to remember. States will call her sister, also her transportation for appointment, after she schedules it to provide her with date and time so sister can help her to remember.  Patient Self Care Activities:  . Calls pharmacy for medication refills . Calls provider office for new concerns or  questions  Please see past updates related to this goal by clicking on the "Past Updates" button in the selected goal         The patient verbalized understanding of instructions provided today and declined a print copy of patient instruction materials.   The care management team will reach out to the patient again over the next 30 days.   Duanne Moron, PharmD, [facility] Clinical Pharmacist [facility] [employer] [phone]

## 2019-11-08 NOTE — Chronic Care Management (AMB) (Signed)
Chronic Care Management   Follow Up Note   11/08/2019 Name: Sharon Henderson MRN: [id number] DOB: 09/19/1941  Referred by: Olin Hauser, DO Reason for referral : Chronic Care Management (Patient Phone Call)   GENIYAH Henderson is a 79 y.o. year old female who is a primary care patient of Olin Hauser, DO. The CCM team was consulted for assistance with chronic disease management and care coordination needs.    I reached out to [employer] by phone today.   Review of patient status, including review of consultants reports, relevant laboratory and other test results, and collaboration with appropriate care team members and the patient's provider was performed as part of comprehensive patient evaluation and provision of chronic care management services.     Outpatient Encounter Medications as of 11/08/2019  Medication Sig  . albuterol (VENTOLIN HFA) 108 (90 Base) MCG/ACT inhaler Inhale 1-2 puffs into the lungs every 6 (six) hours as needed.  . budesonide-formoterol (SYMBICORT) 160-4.5 MCG/ACT inhaler INHALE 2 INHALATIONS INTO THE LUNGS 2 (TWO) TIMES DAILY.  Sharon Henderson tiotropium (SPIRIVA HANDIHALER) 18 MCG inhalation capsule Place 1 capsule (18 mcg total) into inhaler and inhale at bedtime.  . cetirizine (ZYRTEC) 5 MG tablet Take 1 tablet (5 mg total) by mouth daily.  . diclofenac Sodium (VOLTAREN) 1 % GEL Apply 2 g topically 3 (three) times daily as needed. For left knee arthritis pain  . furosemide (LASIX) 40 MG tablet TAKE ONE TABLET BY MOUTH TWICE DAILY. If worsening swelling, may take 1 additional tab in afternoon.  Sharon Henderson losartan (COZAAR) 100 MG tablet TAKE ONE TABLET BY MOUTH DAILY  . metFORMIN (GLUCOPHAGE) 500 MG tablet TAKE ONE TABLET BY MOUTH DAILY WITH BREAKFAST  . metoprolol tartrate (LOPRESSOR) 100 MG tablet TAKE ONE TABLET BY MOUTH TWICE DAILY  . montelukast (SINGULAIR) 10 MG tablet TAKE ONE TABLET BY MOUTH IN THE EVENING for allergies  . Multiple Vitamins-Minerals  (CENTRUM SILVER 50+WOMEN PO) Take by mouth.  . simvastatin (ZOCOR) 10 MG tablet TAKE ONE TABLET BY MOUTH AT BEDTIME   No facility-administered encounter medications on file as of 11/08/2019.    Goals Addressed            This Visit's Progress   . PharmD-Medication Management       Current Barriers:  Sharon Henderson Knowledge Deficits related to indications for her medications . Limited eye sight - Reports needing new glasses   . Limited social support - Currently lives in assisted living facility, but reports needing more assistance  Pharmacist Clinical Goal(s):  Sharon Henderson Over the next 30 days, patient will work with CM Pharmacist and PCP to address needs related to medication adherence, coordination of care and medication regimen optimization  Interventions: . Follow up with patient regarding refill of inhaler o Confirms received Symbicort inhalers in mail from [employer]  . Review again with patient difference between maintenance (Symbicort and Spiriva) vs rescue inhalers (albuterol), how to use each and how to recognize the difference. Counsel patient on rinsing mouth out after each use of Symbicort. . Coordination of care - follow up regarding appointment with [facility] did reschedule eye appointment but missed appointment again. o States will call to reschedule eye appointment now. Offer to make phone number with patient, but she declines. Confirms having phone number. o Discuss strategy to help to remember. States will call her sister, also her transportation for appointment, after she schedules it to provide her with date and time so  sister can help her to remember.  Patient Self Care Activities:  . Calls pharmacy for medication refills . Calls provider office for new concerns or questions  Please see past updates related to this goal by clicking on the "Past Updates" button in the selected goal         Plan  The care management team will reach out to the  patient again over the next 30 days.   Duanne Moron, PharmD, [facility] Clinical Pharmacist [facility] [employer] [phone]

## 2019-11-20 ENCOUNTER — Other Ambulatory Visit: Payer: Self-pay

## 2019-11-20 DIAGNOSIS — R600 Localized edema: Secondary | ICD-10-CM

## 2019-11-20 DIAGNOSIS — J449 Chronic obstructive pulmonary disease, unspecified: Secondary | ICD-10-CM

## 2019-11-20 MED ORDER — ALBUTEROL SULFATE HFA 108 (90 BASE) MCG/ACT IN AERS: 1.0000 | INHALATION_SPRAY | Freq: Four times a day (QID) | RESPIRATORY_TRACT | 2 refills | Status: DC | PRN

## 2019-11-20 MED ORDER — FUROSEMIDE 40 MG PO TABS: 40.0000 mg | ORAL_TABLET | Freq: Two times a day (BID) | ORAL | 1 refills | Status: DC

## 2019-11-20 MED ORDER — BUDESONIDE-FORMOTEROL FUMARATE 160-4.5 MCG/ACT IN AERO: INHALATION_SPRAY | RESPIRATORY_TRACT | 2 refills | Status: DC

## 2019-11-21 ENCOUNTER — Other Ambulatory Visit: Payer: Self-pay | Admitting: Family Medicine

## 2019-11-21 DIAGNOSIS — R600 Localized edema: Secondary | ICD-10-CM

## 2019-11-21 DIAGNOSIS — J449 Chronic obstructive pulmonary disease, unspecified: Secondary | ICD-10-CM

## 2019-11-27 ENCOUNTER — Telehealth: Payer: Self-pay

## 2019-12-02 ENCOUNTER — Other Ambulatory Visit: Payer: Medicare HMO

## 2019-12-03 ENCOUNTER — Ambulatory Visit: Payer: Medicare HMO

## 2019-12-06 ENCOUNTER — Other Ambulatory Visit: Payer: Medicare HMO

## 2019-12-06 ENCOUNTER — Telehealth: Payer: Medicare HMO

## 2019-12-06 ENCOUNTER — Ambulatory Visit: Payer: Self-pay | Admitting: Pharmacist

## 2019-12-06 ENCOUNTER — Other Ambulatory Visit: Payer: Self-pay

## 2019-12-06 DIAGNOSIS — N183 Chronic kidney disease, stage 3 unspecified: Secondary | ICD-10-CM | POA: Diagnosis not present

## 2019-12-06 DIAGNOSIS — E1169 Type 2 diabetes mellitus with other specified complication: Secondary | ICD-10-CM | POA: Diagnosis not present

## 2019-12-06 DIAGNOSIS — I129 Hypertensive chronic kidney disease with stage 1 through stage 4 chronic kidney disease, or unspecified chronic kidney disease: Secondary | ICD-10-CM | POA: Diagnosis not present

## 2019-12-06 DIAGNOSIS — E785 Hyperlipidemia, unspecified: Secondary | ICD-10-CM | POA: Diagnosis not present

## 2019-12-06 DIAGNOSIS — Z0000 Encounter for general adult medical examination without abnormal findings: Secondary | ICD-10-CM | POA: Diagnosis not present

## 2019-12-06 DIAGNOSIS — E1121 Type 2 diabetes mellitus with diabetic nephropathy: Secondary | ICD-10-CM | POA: Diagnosis not present

## 2019-12-06 NOTE — Chronic Care Management (AMB) (Signed)
  Chronic Care Management   Follow Up Note   12/06/2019 Name: Sharon Henderson MRN: [id number] DOB: 10-19-1940  Referred by: Smitty Cords, DO Reason for referral : Chronic Care Management (Patient Phone Call)   Sharon Henderson is a 79 y.o. year old female who is a primary care patient of Smitty Cords, DO. The CCM team was consulted for assistance with chronic disease management and care coordination needs.    I reached out to [employer] by phone today.   Sharon Henderson reports that she is currently heading out of the house to an appointment. Will reschedule this outreach to patient.  Plan   The care management team will reach out to the patient again over the next 30 days.   Duanne Moron, PharmD, [facility] Clinical Pharmacist [facility] [employer] [phone]

## 2019-12-07 LAB — LIPID PANEL
Cholesterol: 145 mg/dL (ref <200)
HDL: 57 mg/dL (ref >=50)
LDL Cholesterol (Calc): 73 mg/dL (calc)
Non-HDL Cholesterol (Calc): 88 mg/dL (calc) (ref <130)
Total CHOL/HDL Ratio: 2.5 (calc) (ref <5.0)
Triglycerides: 71 mg/dL (ref <150)

## 2019-12-07 LAB — COMPLETE METABOLIC PANEL WITH GFR
AG Ratio: 1.4 (calc) (ref 1.0–2.5)
ALT: 12 U/L (ref 6–29)
AST: 16 U/L (ref 10–35)
Albumin: 4 g/dL (ref 3.6–5.1)
Alkaline phosphatase (APISO): 51 U/L (ref 37–153)
BUN/Creatinine Ratio: 16 (calc) (ref 6–22)
BUN: 29 mg/dL — ABNORMAL HIGH (ref 7–25)
CO2: 26 mmol/L (ref 20–32)
Calcium: 9.1 mg/dL (ref 8.6–10.4)
Chloride: 103 mmol/L (ref 98–110)
Creat: 1.8 mg/dL — ABNORMAL HIGH (ref 0.60–0.88)
GFR, Est African American: 30 mL/min/{1.73_m2} — ABNORMAL LOW (ref >=60)
GFR, Est Non African American: 26 mL/min/{1.73_m2} — ABNORMAL LOW (ref >=60)
Globulin: 2.8 g/dL (calc) (ref 1.9–3.7)
Glucose, Bld: 179 mg/dL — ABNORMAL HIGH (ref 65–99)
Potassium: 4.5 mmol/L (ref 3.5–5.3)
Sodium: 140 mmol/L (ref 135–146)
Total Bilirubin: 0.3 mg/dL (ref 0.2–1.2)
Total Protein: 6.8 g/dL (ref 6.1–8.1)

## 2019-12-07 LAB — CBC WITH DIFFERENTIAL/PLATELET
Absolute Monocytes: 573 cells/uL (ref 200–950)
Basophils Absolute: 42 cells/uL (ref 0–200)
Basophils Relative: 0.5 %
Eosinophils Absolute: 614 cells/uL — ABNORMAL HIGH (ref 15–500)
Eosinophils Relative: 7.4 %
HCT: 34.8 % — ABNORMAL LOW (ref 35.0–45.0)
Hemoglobin: 11.6 g/dL — ABNORMAL LOW (ref 11.7–15.5)
Lymphs Abs: 1204 cells/uL (ref 850–3900)
MCH: 31.5 pg (ref 27.0–33.0)
MCHC: 33.3 g/dL (ref 32.0–36.0)
MCV: 94.6 fL (ref 80.0–100.0)
MPV: 11.2 fL (ref 7.5–12.5)
Monocytes Relative: 6.9 %
Neutro Abs: 5868 cells/uL (ref 1500–7800)
Neutrophils Relative %: 70.7 %
Platelets: 196 10*3/uL (ref 140–400)
RBC: 3.68 10*6/uL — ABNORMAL LOW (ref 3.80–5.10)
RDW: 12.7 % (ref 11.0–15.0)
Total Lymphocyte: 14.5 %
WBC: 8.3 10*3/uL (ref 3.8–10.8)

## 2019-12-07 LAB — TSH: TSH: 0.89 mIU/L (ref 0.40–4.50)

## 2019-12-07 LAB — HEMOGLOBIN A1C
Hgb A1c MFr Bld: 6.5 % of total Hgb — ABNORMAL HIGH (ref <5.7)
Mean Plasma Glucose: 140 (calc)
eAG (mmol/L): 7.7 (calc)

## 2019-12-09 ENCOUNTER — Encounter: Payer: Medicare HMO | Admitting: Family Medicine

## 2019-12-11 ENCOUNTER — Other Ambulatory Visit: Payer: Self-pay

## 2019-12-11 ENCOUNTER — Telehealth: Payer: Self-pay | Admitting: Family Medicine

## 2019-12-11 ENCOUNTER — Ambulatory Visit (INDEPENDENT_AMBULATORY_CARE_PROVIDER_SITE_OTHER): Payer: Medicare HMO | Admitting: Family Medicine

## 2019-12-11 ENCOUNTER — Encounter: Payer: Self-pay | Admitting: Family Medicine

## 2019-12-11 VITALS — BP 138/78 | HR 77 | Temp 97.1°F | Ht 66.0 in | Wt 195.6 lb

## 2019-12-11 DIAGNOSIS — J449 Chronic obstructive pulmonary disease, unspecified: Secondary | ICD-10-CM

## 2019-12-11 DIAGNOSIS — E785 Hyperlipidemia, unspecified: Secondary | ICD-10-CM | POA: Diagnosis not present

## 2019-12-11 DIAGNOSIS — N183 Chronic kidney disease, stage 3 unspecified: Secondary | ICD-10-CM

## 2019-12-11 DIAGNOSIS — Z0000 Encounter for general adult medical examination without abnormal findings: Secondary | ICD-10-CM

## 2019-12-11 DIAGNOSIS — E1121 Type 2 diabetes mellitus with diabetic nephropathy: Secondary | ICD-10-CM

## 2019-12-11 DIAGNOSIS — N1832 Chronic kidney disease, stage 3b: Secondary | ICD-10-CM

## 2019-12-11 DIAGNOSIS — I129 Hypertensive chronic kidney disease with stage 1 through stage 4 chronic kidney disease, or unspecified chronic kidney disease: Secondary | ICD-10-CM

## 2019-12-11 DIAGNOSIS — J4489 Other specified chronic obstructive pulmonary disease: Secondary | ICD-10-CM

## 2019-12-11 DIAGNOSIS — E1169 Type 2 diabetes mellitus with other specified complication: Secondary | ICD-10-CM

## 2019-12-11 LAB — HEMOGLOBIN A1C

## 2019-12-11 LAB — CBC WITH DIFFERENTIAL/PLATELET

## 2019-12-11 LAB — TSH

## 2019-12-11 LAB — LIPID PANEL

## 2019-12-11 LAB — COMPLETE METABOLIC PANEL WITH GFR

## 2019-12-11 MED ORDER — PREDNISONE 50 MG PO TABS: 50.0000 mg | ORAL_TABLET | Freq: Every day | ORAL | 0 refills | Status: DC

## 2019-12-11 NOTE — Progress Notes (Signed)
Subjective:    Patient ID: Sharon Henderson, female    DOB: 1941-07-03, 79 y.o.   MRN: [id number]  Sharon Henderson is a 78 y.o. female presenting on 12/11/2019 for Annual Exam (pt  had her COVID vaccine x 1 week ago and admittedly after the vaccine she got sick wheezing, difficulty breathing, fatigue and stiffiness )   HPI   History provided by both patient and daughter. Here for Annual Physical and Lab Review.  DM, Type 2 with CKD-III Followed by Nephrology Dr Cherylann Ratel in [place], next apt coming up in April 2021, they have been watching her GFR with range 30-32. She is awaiting schedule of Renal Ultrasound still Last lab shows Cr up to 1.8 and GFR down to 30 - Thought maybe knee pain medicine NSAID - Last lab panel showed A1c 6.5, controlled Meds: Metformin 500mg  once daily with food Reports good compliance. Tolerating well w/o side-effects Currently on ARB, Statin, Aspirin 81 Lifestyle: - Diet (No changes - still doing well withDM diet with low carb, limits portion, drinks mostly water) - Exercise (walking) Due for DM Eye exam - [facility], she will try to schedulestill - unfortunately she was unable to keep last apt Denies hypoglycemia  CHRONIC HTN / CKD-III Reports occasional home BP check normal range  Current Meds - Losartan 100mg  , Metoprolol 100mg  BID - Also takes Furosemide 40mg  daily everyday - sometimes taking BID Reports good compliance, took meds today. Tolerating well, w/o complaints. - For Edema, she tries compression and elevation  COPD / Asthma(Moderate persistent asthma) / Seasonal and Environmental Allergies Recently after COVID19 vaccine has had mild wheezing more dyspnea, worse after exertion, now somewhat improved. [facility] specialist said she could call if need steroid - Continues current medications with Spiriva, Symbicort, and Albuterol PRN - Continues allergy treatment, singulair and cetirizine  Health Maintenance: UTD COVID19 vaccine, had  reaction and side effect to 2nd dose.   Depression screen [facility] 2/9 06/11/2019 12/03/2018 11/27/2018  Decreased Interest 0 0 0  Down, Depressed, Hopeless 1 0 0  PHQ - 2 Score 1 0 0  Altered sleeping - - -  Tired, decreased energy - - -  Change in appetite - - -  Feeling bad or failure about yourself  - - -  Trouble concentrating - - -  Moving slowly or fidgety/restless - - -  Suicidal thoughts - - -  PHQ-9 Score - - -  Difficult doing work/chores - - -    Past Medical History:  Diagnosis Date  . Allergic rhinitis   . Asthma   . Diabetes mellitus without complication (HCC)   . Diverticulosis   . Hyperlipidemia   . Hypertension   . Internal hemorrhoids    Past Surgical History:  Procedure Laterality Date  . CATARACT EXTRACTION    . COLONOSCOPY     Social History   Socioeconomic History  . Marital status: Widowed    Spouse name: Not on file  . Number of children: Not on file  . Years of education: Not on file  . Highest education level: 9th grade  Occupational History  . Not on file  Tobacco Use  . Smoking status: Never Smoker  . Smokeless tobacco: Never Used  Substance and Sexual Activity  . Alcohol use: No    Alcohol/week: 0.0 standard drinks  . Drug use: No  . Sexual activity: Not on file  Other Topics Concern  . Not on file  Social History Narrative   Lives  in retirement home, family comes to see her often, she goes to church and participates in activities    Social Determinants of Health   Financial Resource Strain:   . Difficulty of Paying Living Expenses:   Food Insecurity:   . Worried About Programme researcher, broadcasting/film/video in the Last Year:   . Barista in the Last Year:   Transportation Needs:   . Freight forwarder (Medical):   [name] Lack of Transportation (Non-Medical):   Physical Activity:   . Days of Exercise per Week:   . Minutes of Exercise per Session:   Stress:   . Feeling of Stress :   Social Connections:   . Frequency of Communication with  Friends and Family:   . Frequency of Social Gatherings with Friends and Family:   . Attends Religious Services:   . Active Member of Clubs or Organizations:   . Attends Banker Meetings:   [name] Marital Status:   Intimate Partner Violence:   . Fear of Current or Ex-Partner:   . Emotionally Abused:   [name] Physically Abused:   . Sexually Abused:    Family History  Problem Relation Age of Onset  . Asthma Mother   . Heart disease Mother   . Heart attack Father    Current Outpatient Medications on File Prior to Visit  Medication Sig  . albuterol (VENTOLIN HFA) 108 (90 Base) MCG/ACT inhaler Inhale 1-2 puffs into the lungs every 6 (six) hours as needed.  . budesonide-formoterol (SYMBICORT) 160-4.5 MCG/ACT inhaler INHALE 2 INHALATIONS INTO THE LUNGS 2 (TWO) TIMES DAILY.  . carvedilol (COREG) 25 MG tablet take one tablet by mouth two times daily with a meal  . cetirizine (ZYRTEC) 5 MG tablet Take 1 tablet (5 mg total) by mouth daily.  . diclofenac Sodium (VOLTAREN) 1 % GEL Apply 2 g topically 3 (three) times daily as needed. For left knee arthritis pain  . fluticasone (FLONASE) 50 MCG/ACT nasal spray INSTILL TWO SPRAYS IN EACH NOSTRIL EVERY DAY  . furosemide (LASIX) 40 MG tablet Take 1 tablet (40 mg total) by mouth 2 (two) times daily. May take additional tab if worse swelling  . losartan (COZAAR) 100 MG tablet TAKE ONE TABLET BY MOUTH DAILY  . metFORMIN (GLUCOPHAGE) 500 MG tablet TAKE ONE TABLET BY MOUTH DAILY WITH BREAKFAST  . Multiple Vitamins-Minerals (CENTRUM SILVER 50+WOMEN PO) Take by mouth.  . simvastatin (ZOCOR) 10 MG tablet TAKE ONE TABLET BY MOUTH AT BEDTIME  . tiotropium (SPIRIVA HANDIHALER) 18 MCG inhalation capsule Place 1 capsule (18 mcg total) into inhaler and inhale at bedtime.  [name] ipratropium-albuterol (DUONEB) 0.5-2.5 (3) MG/3ML SOLN ipratropium 0.5 mg-albuterol 3 mg (2.5 mg base)/3 mL nebulization soln  INHALE ONE vial via nebulizer FOUR TIMES DAILY  . metoprolol  tartrate (LOPRESSOR) 100 MG tablet TAKE ONE TABLET BY MOUTH TWICE DAILY  . montelukast (SINGULAIR) 10 MG tablet TAKE ONE TABLET BY MOUTH IN THE EVENING for allergies (Patient not taking: Reported on 12/11/2019)   No current facility-administered medications on file prior to visit.    Review of Systems Per HPI unless specifically indicated above      Objective:    BP 138/78 (BP Location: Left Arm, Cuff Size: Normal)   Pulse 77   Temp (!) 97.1 F (36.2 C) (Temporal)   Ht 5\' 6"  (1.676 m)   Wt 195 lb 9.6 oz (88.7 kg)   SpO2 100%   BMI 31.57 kg/m   Wt Readings from Last  3 Encounters:  12/11/19 195 lb 9.6 oz (88.7 kg)  06/11/19 201 lb (91.2 kg)  03/08/19 207 lb 3.2 oz (94 kg)    Physical Exam Vitals and nursing note reviewed.  Constitutional:      General: She is not in acute distress.    Appearance: She is well-developed. She is obese. She is not diaphoretic.     Comments: Well-appearing elderly [age] yr female, comfortable, cooperative  HENT:     Head: Normocephalic and atraumatic.  Eyes:     General:        Right eye: No discharge.        Left eye: No discharge.     Conjunctiva/sclera: Conjunctivae normal.  Neck:     Thyroid: No thyromegaly.  Cardiovascular:     Rate and Rhythm: Normal rate and regular rhythm.     Heart sounds: Normal heart sounds. No murmur.  Pulmonary:     Effort: Pulmonary effort is normal. No respiratory distress.     Breath sounds: Wheezing (scattered exp wheezes bilateral, mild) present. No rales.     Comments: Good air movement, speaks full sentences, no inc work of breathing Musculoskeletal:        General: Normal range of motion.     Cervical back: Normal range of motion and neck supple.  Lymphadenopathy:     Cervical: No cervical adenopathy.  Skin:    General: Skin is warm and dry.     Findings: No erythema or rash.  Neurological:     Mental Status: She is alert and oriented to person, place, and time.  Psychiatric:        Behavior:  Behavior normal.     Comments: Well groomed, good eye contact, normal speech and thoughts    Recent Labs    06/11/19 0932 12/06/19 1035 12/06/19 1059  HGBA1C 6.9* CANCELED 6.5*     Results for orders placed or performed in visit on 07/19/19  TSH  Result Value Ref Range   TSH 0.89 0.40 - 4.50 mIU/L  Lipid panel  Result Value Ref Range   Cholesterol 145 <200 mg/dL   HDL 57 > OR = 50 mg/dL   Triglycerides 71 <782<150 mg/dL   LDL Cholesterol (Calc) 73 mg/dL (calc)   Total CHOL/HDL Ratio 2.5 <5.0 (calc)   Non-HDL Cholesterol (Calc) 88 <956<130 mg/dL (calc)  COMPLETE METABOLIC PANEL WITH GFR  Result Value Ref Range   Glucose, Bld 179 (H) 65 - 99 mg/dL   BUN 29 (H) 7 - 25 mg/dL   Creat 2.131.80 (H) 0.860.60 - 0.88 mg/dL   GFR, Est Non African American 26 (L) > OR = 60 mL/min/1.2073m2   GFR, Est African American 30 (L) > OR = 60 mL/min/1.8273m2   BUN/Creatinine Ratio 16 6 - 22 (calc)   Sodium 140 135 - 146 mmol/L   Potassium 4.5 3.5 - 5.3 mmol/L   Chloride 103 98 - 110 mmol/L   CO2 26 20 - 32 mmol/L   Calcium 9.1 8.6 - 10.4 mg/dL   Total Protein 6.8 6.1 - 8.1 g/dL   Albumin 4.0 3.6 - 5.1 g/dL   Globulin 2.8 1.9 - 3.7 g/dL (calc)   AG Ratio 1.4 1.0 - 2.5 (calc)   Total Bilirubin 0.3 0.2 - 1.2 mg/dL   Alkaline phosphatase (APISO) 51 37 - 153 U/L   AST 16 10 - 35 U/L   ALT 12 6 - 29 U/L  CBC with Differential/Platelet  Result Value Ref Range   WBC 8.3  3.8 - 10.8 Thousand/uL   RBC 3.68 (L) 3.80 - 5.10 Million/uL   Hemoglobin 11.6 (L) 11.7 - 15.5 g/dL   HCT 34.8 (L) 35.0 - 45.0 %   MCV 94.6 80.0 - 100.0 fL   MCH 31.5 27.0 - 33.0 pg   MCHC 33.3 32.0 - 36.0 g/dL   RDW 12.7 11.0 - 15.0 %   Platelets 196 140 - 400 Thousand/uL   MPV 11.2 7.5 - 12.5 fL   Neutro Abs 5,868 1,500 - 7,800 cells/uL   Lymphs Abs 1,204 850 - 3,900 cells/uL   Absolute Monocytes 573 200 - 950 cells/uL   Eosinophils Absolute 614 (H) 15 - 500 cells/uL   Basophils Absolute 42 0 - 200 cells/uL   Neutrophils Relative %  70.7 %   Total Lymphocyte 14.5 %   Monocytes Relative 6.9 %   Eosinophils Relative 7.4 %   Basophils Relative 0.5 %  Hemoglobin A1c  Result Value Ref Range   Hgb A1c MFr Bld 6.5 (H) <5.7 % of total Hgb   Mean Plasma Glucose 140 (calc)   eAG (mmol/L) 7.7 (calc)  Lipid panel  Result Value Ref Range   LDL Cholesterol (Calc) CANCELED   COMPLETE METABOLIC PANEL WITH GFR  Result Value Ref Range   Glucose, Bld CANCELED   Hemoglobin A1c  Result Value Ref Range   Hgb A1c MFr Bld CANCELED   TSH  Result Value Ref Range   TSH CANCELED   CBC with Differential/Platelet  Result Value Ref Range   WBC CANCELED       Assessment & Plan:   Problem List Items Addressed This Visit    Well controlled type 2 diabetes mellitus with nephropathy ([place])    Well-controlled DM with U2V 6.5 Complications - CKD-III, peripheral neuropathy, other including hyperlipidemia, GERD, depression, obesity - increases risk of future cardiovascular complications   Plan:  1. Continue current therapy - Metformin 500mg  daily 2. Encourage improved lifestyle - low carb, low sugar diet, reduce portion size, continue improving regular exercise 3. Continue ASA, ARB, Statin  F/u q 6 mo      Hyperlipidemia associated with type 2 diabetes mellitus ([place])    Controlled cholesterol on statin and lifestyle Last lipid panel 11/2019  Plan: 1. Continue current meds - Simvastatin 10mg  daily 2. Continue ASA 81mg  for primary ASCVD risk reduction 3. Encourage improved lifestyle - low carb/cholesterol, reduce portion size, continue improving regular exercise      CKD (chronic kidney disease), stage III    Stable CKD, secondary to likely HTN, DM, Age  GFR to 30 Followed by Dr Charline Bills [place] Due for Renal US they can f/u with them and scheduled in 12/2019 already  1. Continue to control BP, A1c 2. Improve hydration 3. Remain off oral NSAIDs      Benign hypertension with CKD (chronic kidney disease) stage III (HCC)     Controlled HTN at goal, improved on re-check - Home BP readings reported normal Complication with CKD-III followed by Nephrology now Failed Amlodipine - edema    Plan:  1. Continue current BP regimen Losartan 100mg  daily, Metoprolol 100mg  BID - May continue Furosemide 40mg  daily for edema can use extra dose if need but again advise may need to limit diuretic due to CKD 2. Encourage improved lifestyle - low sodium diet, regular exercise 3. Continue monitor BP outside office, bring readings to next visit, if persistently >150/90 or new symptoms notify office sooner      Relevant Medications  carvedilol (COREG) 25 MG tablet   Asthma-chronic obstructive pulmonary disease overlap syndrome (HCC)    Acute mild flare up w wheezing now following 2nd covid vaccine Call back if interested in prednisone flare, for now hold off  Otherwise chronic mixed asthma / COPD in patient with environmental allergies but never smoker, but 2nd hand smoke Followed by Pulm ([facility] Dr Ashley Royalty) Controlled on Symbicort, Spiriva, Albuterol Continue Singulair, Cetirizine      Relevant Medications   fluticasone (FLONASE) 50 MCG/ACT nasal spray   ipratropium-albuterol (DUONEB) 0.5-2.5 (3) MG/3ML SOLN    Other Visit Diagnoses    Annual physical exam    -  Primary      Updated Health Maintenance information Reviewed recent lab results with patient Encouraged improvement to lifestyle with diet and exercise Maintain healthy weight  No orders of the defined types were placed in this encounter.     Follow up plan: Return in about 6 months (around 06/12/2020) for 6 month follow-up DM A1c, HTN, CKD, Asthma.  Saralyn Pilar, DO [facility] 12/11/2019, 9:52 AM

## 2019-12-11 NOTE — Assessment & Plan Note (Signed)
Controlled HTN at goal, improved on re-check - Home BP readings reported normal Complication with CKD-III followed by Nephrology now Failed Amlodipine - edema    Plan:  1. Continue current BP regimen Losartan 100mg  daily, Metoprolol 100mg  BID - May continue Furosemide 40mg  daily for edema can use extra dose if need but again advise may need to limit diuretic due to CKD 2. Encourage improved lifestyle - low sodium diet, regular exercise 3. Continue monitor BP outside office, bring readings to next visit, if persistently >150/90 or new symptoms notify office sooner

## 2019-12-11 NOTE — Patient Instructions (Addendum)
Thank you for coming to the office today.  Follow back up with Dr Paula [facility] specialist. - apt April 26  They recommended Renal Ultrasound, check on scheduling with this.  Last lab showed Creatinine 1.8 and GFR 30  You can follow back up with Orthopedic specialist to reconsider another steroid injection in knee.  Try once more to re-schedule with [facility] for diabetic eye exam.  If not improving with wheezing can use nebulizer treatment or call us if you need a prednisone treatment  Please schedule a Follow-up Appointment to: Return in about 6 months (around 06/12/2020) for 6 month follow-up DM A1c, HTN, CKD, Asthma.  If you have any other questions or concerns, please feel free to call the office or send a message through MyChart. You may also schedule an earlier appointment if necessary.  Additionally, you may be receiving a survey about your experience at our office within a few days to 1 week by e-mail or mail. We value your feedback.  Saralyn Pilar, DO [facility], [place]

## 2019-12-11 NOTE — Assessment & Plan Note (Signed)
Stable CKD, secondary to likely HTN, DM, Age  GFR to 30 Followed by Dr Christa See [place] Due for Renal US they can f/u with them and scheduled in 12/2019 already  1. Continue to control BP, A1c 2. Improve hydration 3. Remain off oral NSAIDs

## 2019-12-11 NOTE — Assessment & Plan Note (Signed)
Well-controlled DM with A1c 6.5 Complications - CKD-III, peripheral neuropathy, other including hyperlipidemia, GERD, depression, obesity - increases risk of future cardiovascular complications   Plan:  1. Continue current therapy - Metformin 500mg  daily 2. Encourage improved lifestyle - low carb, low sugar diet, reduce portion size, continue improving regular exercise 3. Continue ASA, ARB, Statin  F/u q 6 mo

## 2019-12-11 NOTE — Telephone Encounter (Signed)
Called back today after apt, requesting Prednisone for her breathing, with some wheezing and asthma. She said has Asthma specialist as well but said they were okay for her to take prednisone if needed, still seems secondary to COVID vaccine but improving gradually.  Agree will send in Prednisone burst.  Saralyn Pilar, DO [facility] 12/11/2019, 10:57 AM

## 2019-12-11 NOTE — Assessment & Plan Note (Signed)
Controlled cholesterol on statin and lifestyle Last lipid panel 11/2019  Plan: 1. Continue current meds - Simvastatin 10mg  daily 2. Continue ASA 81mg  for primary ASCVD risk reduction 3. Encourage improved lifestyle - low carb/cholesterol, reduce portion size, continue improving regular exercise

## 2019-12-11 NOTE — Assessment & Plan Note (Signed)
Acute mild flare up w wheezing now following 2nd covid vaccine Call back if interested in prednisone flare, for now hold off  Otherwise chronic mixed asthma / COPD in patient with environmental allergies but never smoker, but 2nd hand smoke Followed by Pulm ([facility]) Controlled on Symbicort, Spiriva, Albuterol Continue Singulair, Cetirizine

## 2019-12-19 ENCOUNTER — Other Ambulatory Visit: Payer: Self-pay | Admitting: Nephrology

## 2019-12-19 DIAGNOSIS — N1831 Chronic kidney disease, stage 3a: Secondary | ICD-10-CM

## 2019-12-24 ENCOUNTER — Other Ambulatory Visit: Payer: Self-pay

## 2019-12-24 ENCOUNTER — Ambulatory Visit: Payer: Medicare HMO

## 2019-12-24 ENCOUNTER — Ambulatory Visit
Admission: RE | Admit: 2019-12-24 | Discharge: 2019-12-24 | Disposition: A | Discharge status: Home / Self Care | Payer: Medicare HMO | Source: Ambulatory Visit | Attending: Nephrology | Admitting: Nephrology

## 2019-12-24 DIAGNOSIS — N281 Cyst of kidney, acquired: Secondary | ICD-10-CM | POA: Diagnosis not present

## 2019-12-24 DIAGNOSIS — N189 Chronic kidney disease, unspecified: Secondary | ICD-10-CM | POA: Diagnosis not present

## 2019-12-24 DIAGNOSIS — N1831 Chronic kidney disease, stage 3a: Secondary | ICD-10-CM | POA: Insufficient documentation

## 2019-12-25 ENCOUNTER — Ambulatory Visit: Payer: Medicare HMO

## 2019-12-27 ENCOUNTER — Ambulatory Visit (INDEPENDENT_AMBULATORY_CARE_PROVIDER_SITE_OTHER): Payer: Medicare HMO | Admitting: Pharmacist

## 2019-12-27 DIAGNOSIS — N183 Chronic kidney disease, stage 3 unspecified: Secondary | ICD-10-CM | POA: Diagnosis not present

## 2019-12-27 DIAGNOSIS — J449 Chronic obstructive pulmonary disease, unspecified: Secondary | ICD-10-CM

## 2019-12-27 DIAGNOSIS — I129 Hypertensive chronic kidney disease with stage 1 through stage 4 chronic kidney disease, or unspecified chronic kidney disease: Secondary | ICD-10-CM

## 2019-12-27 NOTE — Chronic Care Management (AMB) (Signed)
Chronic Care Management   Follow Up Note   12/27/2019 Name: Sharon Henderson MRN: [id number] DOB: [birth year]  Referred by: Olin Hauser, DO Reason for referral : Chronic Care Management (Patient Phone Call)   Sharon Henderson is a 79 y.o. year old female who is a primary care patient of Olin Hauser, DO. The CCM team was consulted for assistance with chronic disease management and care coordination needs.    I reached out to [employer] by phone today.   Review of patient status, including review of consultants reports, relevant laboratory and other test results, and collaboration with appropriate care team members and the patient's provider was performed as part of comprehensive patient evaluation and provision of chronic care management services.     Outpatient Encounter Medications as of 12/27/2019  Medication Sig  . albuterol (VENTOLIN HFA) 108 (90 Base) MCG/ACT inhaler Inhale 1-2 puffs into the lungs every 6 (six) hours as needed.  . budesonide-formoterol (SYMBICORT) 160-4.5 MCG/ACT inhaler INHALE 2 INHALATIONS INTO THE LUNGS 2 (TWO) TIMES DAILY.  [name] tiotropium (SPIRIVA HANDIHALER) 18 MCG inhalation capsule Place 1 capsule (18 mcg total) into inhaler and inhale at bedtime.  . carvedilol (COREG) 25 MG tablet take one tablet by mouth two times daily with a meal  . cetirizine (ZYRTEC) 5 MG tablet Take 1 tablet (5 mg total) by mouth daily.  . diclofenac Sodium (VOLTAREN) 1 % GEL Apply 2 g topically 3 (three) times daily as needed. For left knee arthritis pain  . fluticasone (FLONASE) 50 MCG/ACT nasal spray INSTILL TWO SPRAYS IN EACH NOSTRIL EVERY DAY  . furosemide (LASIX) 40 MG tablet Take 1 tablet (40 mg total) by mouth 2 (two) times daily. May take additional tab if worse swelling  . ipratropium-albuterol (DUONEB) 0.5-2.5 (3) MG/3ML SOLN ipratropium 0.5 mg-albuterol 3 mg (2.5 mg base)/3 mL nebulization soln  INHALE ONE vial via nebulizer FOUR TIMES DAILY  . losartan  (COZAAR) 100 MG tablet TAKE ONE TABLET BY MOUTH DAILY  . metFORMIN (GLUCOPHAGE) 500 MG tablet TAKE ONE TABLET BY MOUTH DAILY WITH BREAKFAST  . metoprolol tartrate (LOPRESSOR) 100 MG tablet TAKE ONE TABLET BY MOUTH TWICE DAILY  . montelukast (SINGULAIR) 10 MG tablet TAKE ONE TABLET BY MOUTH IN THE EVENING for allergies (Patient not taking: Reported on 12/11/2019)  . Multiple Vitamins-Minerals (CENTRUM SILVER 50+WOMEN PO) Take by mouth.  . simvastatin (ZOCOR) 10 MG tablet TAKE ONE TABLET BY MOUTH AT BEDTIME  . [DISCONTINUED] predniSONE (DELTASONE) 50 MG tablet Take 1 tablet (50 mg total) by mouth daily with breakfast. For 5 days   No facility-administered encounter medications on file as of 12/27/2019.    Goals Addressed            This Visit's Progress   . PharmD-Medication Management       Current Barriers:  [name] Knowledge Deficits related to indications for her medications . Limited eye sight - Reports needing new glasses   . Limited social support - Currently lives in assisted living facility, but reports needing more assistance  Pharmacist Clinical Goal(s):  [name] Over the next 30 days, patient will work with CM Pharmacist and PCP to address needs related to medication adherence, coordination of care and medication regimen optimization  Interventions: . Counsel on importance of medication adherence.  o Reports taking medications from pill packaging as filled by [facility] o Patient confirms using maintenance (Symbicort and Spiriva) vs rescue inhalers (albuterol) as directed and rinsing mouth out after each use  of Symbicort. o Reports some improvement in breathing after completing course of prednisone as prescribed by PCP . Coordination of care - follow up regarding appointment with [facility] o Reports did not reschedule eye appointment o Reports interest in seeing [facility] in [place] as this office is closer for her.  - Provide patient with phone number.  - States she  will call with daughter (who would take her to this appointment) . Counsel on importance of BP control and monitoring o Denies having checked BP recently, but reports able to have this checked regularly by nursing home o Encourage patient to have home BP checked and keep log of results  Patient Self Care Activities:  . Calls pharmacy for medication refills . Calls provider office for new concerns or questions  Please see past updates related to this goal by clicking on the "Past Updates" button in the selected goal         Plan  The care management team will reach out to the patient again over the next 60 days.   Duanne Moron, PharmD, [facility] Clinical Pharmacist [facility] Medical [employer] [phone]

## 2019-12-27 NOTE — Patient Instructions (Signed)
Thank you allowing the Chronic Care Management Team to be a part of your care! It was a pleasure speaking with you today!     CCM (Chronic Care Management) Team    Alto Denver RN, MSN, CCM Nurse Care Coordinator  [phone]   Duanne Moron PharmD  Clinical Pharmacist  [phone]   Dickie La LCSW Clinical Social Worker [phone]  Visit Information  Goals Addressed            This Visit's Progress   . PharmD-Medication Management       Current Barriers:  [name] Knowledge Deficits related to indications for her medications . Limited eye sight - Reports needing new glasses   . Limited social support - Currently lives in assisted living facility, but reports needing more assistance  Pharmacist Clinical Goal(s):  [name] Over the next 30 days, patient will work with CM Pharmacist and PCP to address needs related to medication adherence, coordination of care and medication regimen optimization  Interventions: . Counsel on importance of medication adherence.  o Reports taking medications from pill packaging as filled by [facility] o Patient confirms using maintenance (Symbicort and Spiriva) vs rescue inhalers (albuterol) as directed and rinsing mouth out after each use of Symbicort. o Reports some improvement in breathing after completing course of prednisone as prescribed by PCP . Coordination of care - follow up regarding appointment with [facility] o Reports did not reschedule eye appointment o Reports interest in seeing [facility] in [place] as this office is closer for her.  - Provide patient with phone number.  - States she will call with daughter (who would take her to this appointment) . Counsel on importance of BP control and monitoring o Denies having checked BP recently, but reports able to have this checked regularly by nursing home o Encourage patient to have home BP checked and keep log of results  Patient Self Care Activities:  . Calls  pharmacy for medication refills . Calls provider office for new concerns or questions  Please see past updates related to this goal by clicking on the "Past Updates" button in the selected goal         Patient verbalizes understanding of instructions provided today.   The care management team will reach out to the patient again over the next 60 days.   Duanne Moron, PharmD, [facility] Clinical Pharmacist [facility] Medical [employer] [phone]

## 2020-01-14 DIAGNOSIS — H4311 Vitreous hemorrhage, right eye: Secondary | ICD-10-CM | POA: Diagnosis not present

## 2020-01-17 ENCOUNTER — Emergency Department (HOSPITAL_COMMUNITY): Payer: Medicare HMO | Admitting: Anesthesiology

## 2020-01-17 ENCOUNTER — Inpatient Hospital Stay (HOSPITAL_COMMUNITY): Payer: Medicare HMO

## 2020-01-17 ENCOUNTER — Other Ambulatory Visit (HOSPITAL_COMMUNITY): Payer: Medicare HMO

## 2020-01-17 ENCOUNTER — Encounter (HOSPITAL_COMMUNITY)
Admission: EM | Disposition: A | Discharge status: Hospice | Payer: Self-pay | Source: Home / Self Care | Attending: Neurology

## 2020-01-17 ENCOUNTER — Emergency Department (HOSPITAL_COMMUNITY): Payer: Medicare HMO

## 2020-01-17 ENCOUNTER — Inpatient Hospital Stay (HOSPITAL_COMMUNITY)
Admission: EM | Admit: 2020-01-17 | Discharge: 2020-01-24 | DRG: 023 | Disposition: A | Discharge status: Hospice | Payer: Medicare HMO | Attending: Neurology | Admitting: Neurology

## 2020-01-17 DIAGNOSIS — I63512 Cerebral infarction due to unspecified occlusion or stenosis of left middle cerebral artery: Secondary | ICD-10-CM | POA: Diagnosis not present

## 2020-01-17 DIAGNOSIS — M1712 Unilateral primary osteoarthritis, left knee: Secondary | ICD-10-CM | POA: Diagnosis present

## 2020-01-17 DIAGNOSIS — Z66 Do not resuscitate: Secondary | ICD-10-CM | POA: Diagnosis not present

## 2020-01-17 DIAGNOSIS — G936 Cerebral edema: Secondary | ICD-10-CM | POA: Diagnosis not present

## 2020-01-17 DIAGNOSIS — Z20822 Contact with and (suspected) exposure to covid-19: Secondary | ICD-10-CM | POA: Diagnosis not present

## 2020-01-17 DIAGNOSIS — E785 Hyperlipidemia, unspecified: Secondary | ICD-10-CM | POA: Diagnosis present

## 2020-01-17 DIAGNOSIS — J9811 Atelectasis: Secondary | ICD-10-CM | POA: Diagnosis not present

## 2020-01-17 DIAGNOSIS — Z4682 Encounter for fitting and adjustment of non-vascular catheter: Secondary | ICD-10-CM | POA: Diagnosis not present

## 2020-01-17 DIAGNOSIS — Z825 Family history of asthma and other chronic lower respiratory diseases: Secondary | ICD-10-CM

## 2020-01-17 DIAGNOSIS — E1122 Type 2 diabetes mellitus with diabetic chronic kidney disease: Secondary | ICD-10-CM | POA: Diagnosis present

## 2020-01-17 DIAGNOSIS — M6281 Muscle weakness (generalized): Secondary | ICD-10-CM | POA: Diagnosis not present

## 2020-01-17 DIAGNOSIS — I69398 Other sequelae of cerebral infarction: Secondary | ICD-10-CM | POA: Diagnosis not present

## 2020-01-17 DIAGNOSIS — Z4659 Encounter for fitting and adjustment of other gastrointestinal appliance and device: Secondary | ICD-10-CM

## 2020-01-17 DIAGNOSIS — N1832 Chronic kidney disease, stage 3b: Secondary | ICD-10-CM | POA: Diagnosis present

## 2020-01-17 DIAGNOSIS — E1169 Type 2 diabetes mellitus with other specified complication: Secondary | ICD-10-CM | POA: Diagnosis present

## 2020-01-17 DIAGNOSIS — E876 Hypokalemia: Secondary | ICD-10-CM | POA: Diagnosis not present

## 2020-01-17 DIAGNOSIS — R29810 Facial weakness: Secondary | ICD-10-CM | POA: Diagnosis present

## 2020-01-17 DIAGNOSIS — R0689 Other abnormalities of breathing: Secondary | ICD-10-CM | POA: Diagnosis not present

## 2020-01-17 DIAGNOSIS — J449 Chronic obstructive pulmonary disease, unspecified: Secondary | ICD-10-CM | POA: Diagnosis not present

## 2020-01-17 DIAGNOSIS — J9600 Acute respiratory failure, unspecified whether with hypoxia or hypercapnia: Secondary | ICD-10-CM | POA: Diagnosis not present

## 2020-01-17 DIAGNOSIS — D649 Anemia, unspecified: Secondary | ICD-10-CM | POA: Diagnosis not present

## 2020-01-17 DIAGNOSIS — R414 Neurologic neglect syndrome: Secondary | ICD-10-CM | POA: Diagnosis present

## 2020-01-17 DIAGNOSIS — Z9849 Cataract extraction status, unspecified eye: Secondary | ICD-10-CM

## 2020-01-17 DIAGNOSIS — R062 Wheezing: Secondary | ICD-10-CM | POA: Diagnosis not present

## 2020-01-17 DIAGNOSIS — I129 Hypertensive chronic kidney disease with stage 1 through stage 4 chronic kidney disease, or unspecified chronic kidney disease: Secondary | ICD-10-CM | POA: Diagnosis present

## 2020-01-17 DIAGNOSIS — K219 Gastro-esophageal reflux disease without esophagitis: Secondary | ICD-10-CM | POA: Diagnosis not present

## 2020-01-17 DIAGNOSIS — J9601 Acute respiratory failure with hypoxia: Secondary | ICD-10-CM | POA: Diagnosis not present

## 2020-01-17 DIAGNOSIS — I63412 Cerebral infarction due to embolism of left middle cerebral artery: Secondary | ICD-10-CM | POA: Diagnosis not present

## 2020-01-17 DIAGNOSIS — E669 Obesity, unspecified: Secondary | ICD-10-CM | POA: Diagnosis present

## 2020-01-17 DIAGNOSIS — Z791 Long term (current) use of non-steroidal anti-inflammatories (NSAID): Secondary | ICD-10-CM

## 2020-01-17 DIAGNOSIS — R29818 Other symptoms and signs involving the nervous system: Secondary | ICD-10-CM | POA: Diagnosis not present

## 2020-01-17 DIAGNOSIS — E872 Acidosis: Secondary | ICD-10-CM | POA: Diagnosis not present

## 2020-01-17 DIAGNOSIS — Z978 Presence of other specified devices: Secondary | ICD-10-CM

## 2020-01-17 DIAGNOSIS — I639 Cerebral infarction, unspecified: Secondary | ICD-10-CM | POA: Diagnosis not present

## 2020-01-17 DIAGNOSIS — Z515 Encounter for palliative care: Secondary | ICD-10-CM

## 2020-01-17 DIAGNOSIS — G9349 Other encephalopathy: Secondary | ICD-10-CM | POA: Diagnosis present

## 2020-01-17 DIAGNOSIS — R4701 Aphasia: Secondary | ICD-10-CM | POA: Diagnosis present

## 2020-01-17 DIAGNOSIS — R0902 Hypoxemia: Secondary | ICD-10-CM | POA: Diagnosis not present

## 2020-01-17 DIAGNOSIS — I63312 Cerebral infarction due to thrombosis of left middle cerebral artery: Secondary | ICD-10-CM

## 2020-01-17 DIAGNOSIS — Z7984 Long term (current) use of oral hypoglycemic drugs: Secondary | ICD-10-CM

## 2020-01-17 DIAGNOSIS — Z7951 Long term (current) use of inhaled steroids: Secondary | ICD-10-CM

## 2020-01-17 DIAGNOSIS — Z79899 Other long term (current) drug therapy: Secondary | ICD-10-CM

## 2020-01-17 DIAGNOSIS — N179 Acute kidney failure, unspecified: Secondary | ICD-10-CM | POA: Diagnosis present

## 2020-01-17 DIAGNOSIS — H518 Other specified disorders of binocular movement: Secondary | ICD-10-CM | POA: Diagnosis present

## 2020-01-17 DIAGNOSIS — I6602 Occlusion and stenosis of left middle cerebral artery: Secondary | ICD-10-CM | POA: Diagnosis present

## 2020-01-17 DIAGNOSIS — R29726 NIHSS score 26: Secondary | ICD-10-CM | POA: Diagnosis present

## 2020-01-17 DIAGNOSIS — Z8249 Family history of ischemic heart disease and other diseases of the circulatory system: Secondary | ICD-10-CM

## 2020-01-17 DIAGNOSIS — D696 Thrombocytopenia, unspecified: Secondary | ICD-10-CM | POA: Diagnosis not present

## 2020-01-17 DIAGNOSIS — R404 Transient alteration of awareness: Secondary | ICD-10-CM | POA: Diagnosis not present

## 2020-01-17 DIAGNOSIS — Z6831 Body mass index (BMI) 31.0-31.9, adult: Secondary | ICD-10-CM

## 2020-01-17 DIAGNOSIS — I69320 Aphasia following cerebral infarction: Secondary | ICD-10-CM | POA: Diagnosis not present

## 2020-01-17 DIAGNOSIS — I6389 Other cerebral infarction: Secondary | ICD-10-CM | POA: Diagnosis not present

## 2020-01-17 DIAGNOSIS — Z9911 Dependence on respirator [ventilator] status: Secondary | ICD-10-CM

## 2020-01-17 HISTORY — PX: IR CT HEAD LTD: IMG2386

## 2020-01-17 HISTORY — PX: IR PERCUTANEOUS ART THROMBECTOMY/INFUSION INTRACRANIAL INC DIAG ANGIO: IMG6087

## 2020-01-17 HISTORY — PX: RADIOLOGY WITH ANESTHESIA: SHX6223

## 2020-01-17 LAB — DIFFERENTIAL
Abs Immature Granulocytes: 0.08 10*3/uL — ABNORMAL HIGH (ref 0.00–0.07)
Basophils Absolute: 0 10*3/uL (ref 0.0–0.1)
Basophils Relative: 0 %
Eosinophils Absolute: 0 10*3/uL (ref 0.0–0.5)
Eosinophils Relative: 0 %
Immature Granulocytes: 1 %
Lymphocytes Relative: 6 %
Lymphs Abs: 0.9 10*3/uL (ref 0.7–4.0)
Monocytes Absolute: 0.6 10*3/uL (ref 0.1–1.0)
Monocytes Relative: 4 %
Neutro Abs: 13 10*3/uL — ABNORMAL HIGH (ref 1.7–7.7)
Neutrophils Relative %: 89 %

## 2020-01-17 LAB — BLOOD GAS, ARTERIAL
Acid-base deficit: 7.3 mmol/L — ABNORMAL HIGH (ref 0.0–2.0)
Bicarbonate: 20.3 mmol/L (ref 20.0–28.0)
Drawn by: 545371
FIO2: 60
O2 Saturation: 98.4 %
Patient temperature: 34.3
pCO2 arterial: 53.9 mmHg — ABNORMAL HIGH (ref 32.0–48.0)
pH, Arterial: 7.179 — CL (ref 7.350–7.450)
pO2, Arterial: 143 mmHg — ABNORMAL HIGH (ref 83.0–108.0)

## 2020-01-17 LAB — COMPREHENSIVE METABOLIC PANEL
ALT: 39 U/L (ref 0–44)
AST: 109 U/L — ABNORMAL HIGH (ref 15–41)
Albumin: 4 g/dL (ref 3.5–5.0)
Alkaline Phosphatase: 55 U/L (ref 38–126)
Anion gap: 14 (ref 5–15)
BUN: 36 mg/dL — ABNORMAL HIGH (ref 8–23)
CO2: 20 mmol/L — ABNORMAL LOW (ref 22–32)
Calcium: 9.4 mg/dL (ref 8.9–10.3)
Chloride: 107 mmol/L (ref 98–111)
Creatinine, Ser: 2.15 mg/dL — ABNORMAL HIGH (ref 0.44–1.00)
GFR calc Af Amer: 24 mL/min — ABNORMAL LOW (ref >60)
GFR calc non Af Amer: 21 mL/min — ABNORMAL LOW (ref >60)
Glucose, Bld: 173 mg/dL — ABNORMAL HIGH (ref 70–99)
Potassium: 3.9 mmol/L (ref 3.5–5.1)
Sodium: 141 mmol/L (ref 135–145)
Total Bilirubin: 0.8 mg/dL (ref 0.3–1.2)
Total Protein: 7 g/dL (ref 6.5–8.1)

## 2020-01-17 LAB — RESPIRATORY PANEL BY RT PCR (FLU A&B, COVID)
Influenza A by PCR: NEGATIVE
Influenza B by PCR: NEGATIVE
SARS Coronavirus 2 by RT PCR: NEGATIVE

## 2020-01-17 LAB — POCT I-STAT, CHEM 8
BUN: 37 mg/dL — ABNORMAL HIGH (ref 8–23)
Calcium, Ion: 1.15 mmol/L (ref 1.15–1.40)
Chloride: 106 mmol/L (ref 98–111)
Creatinine, Ser: 2.1 mg/dL — ABNORMAL HIGH (ref 0.44–1.00)
Glucose, Bld: 172 mg/dL — ABNORMAL HIGH (ref 70–99)
HCT: 40 % (ref 36.0–46.0)
Hemoglobin: 13.6 g/dL (ref 12.0–15.0)
Potassium: 3.9 mmol/L (ref 3.5–5.1)
Sodium: 140 mmol/L (ref 135–145)
TCO2: 22 mmol/L (ref 22–32)

## 2020-01-17 LAB — GLUCOSE, CAPILLARY
Glucose-Capillary: 178 mg/dL — ABNORMAL HIGH (ref 70–99)
Glucose-Capillary: 192 mg/dL — ABNORMAL HIGH (ref 70–99)

## 2020-01-17 LAB — CBC
HCT: 39.1 % (ref 36.0–46.0)
Hemoglobin: 12.2 g/dL (ref 12.0–15.0)
MCH: 31.4 pg (ref 26.0–34.0)
MCHC: 31.2 g/dL (ref 30.0–36.0)
MCV: 100.5 fL — ABNORMAL HIGH (ref 80.0–100.0)
Platelets: 171 10*3/uL (ref 150–400)
RBC: 3.89 MIL/uL (ref 3.87–5.11)
RDW: 12.4 % (ref 11.5–15.5)
WBC: 14.5 10*3/uL — ABNORMAL HIGH (ref 4.0–10.5)
nRBC: 0 % (ref 0.0–0.2)

## 2020-01-17 LAB — PROTIME-INR
INR: 1.3 — ABNORMAL HIGH (ref 0.8–1.2)
INR: 1.3 — ABNORMAL HIGH (ref 0.8–1.2)
Prothrombin Time: 16.5 seconds — ABNORMAL HIGH (ref 11.4–15.2)
Prothrombin Time: 15.7 seconds — ABNORMAL HIGH (ref 11.4–15.2)

## 2020-01-17 LAB — APTT
aPTT: >200 seconds (ref 24–36)
aPTT: 46 seconds — ABNORMAL HIGH (ref 24–36)

## 2020-01-17 LAB — TRIGLYCERIDES: Triglycerides: 38 mg/dL (ref <150)

## 2020-01-17 LAB — CBG MONITORING, ED: Glucose-Capillary: 149 mg/dL — ABNORMAL HIGH (ref 70–99)

## 2020-01-17 LAB — MRSA PCR SCREENING: MRSA by PCR: NEGATIVE

## 2020-01-17 SURGERY — IR WITH ANESTHESIA
Anesthesia: General

## 2020-01-17 MED ORDER — SODIUM CHLORIDE 0.9 % IV SOLN: INTRAVENOUS | Status: DC

## 2020-01-17 MED ORDER — SENNOSIDES-DOCUSATE SODIUM 8.6-50 MG PO TABS: 1.0000 | ORAL_TABLET | Freq: Every evening | ORAL | Status: DC | PRN

## 2020-01-17 MED ORDER — PHENYLEPHRINE HCL-NACL 10-0.9 MG/250ML-% IV SOLN
INTRAVENOUS | Status: DC | PRN
  Administered 2020-01-17: 150 ug/min via INTRAVENOUS

## 2020-01-17 MED ORDER — SODIUM BICARBONATE 8.4 % IV SOLN: 50.0000 meq | Freq: Once | INTRAVENOUS | Status: DC

## 2020-01-17 MED ORDER — SUCCINYLCHOLINE CHLORIDE 200 MG/10ML IV SOSY
PREFILLED_SYRINGE | INTRAVENOUS | Status: DC | PRN
  Administered 2020-01-17: 100 mg via INTRAVENOUS

## 2020-01-17 MED ORDER — INSULIN ASPART 100 UNIT/ML ~~LOC~~ SOLN
0.0000 [IU] | SUBCUTANEOUS | Status: DC
  Administered 2020-01-17: 3 [IU] via SUBCUTANEOUS
  Administered 2020-01-18: 2 [IU] via SUBCUTANEOUS
  Administered 2020-01-18 (×2): 3 [IU] via SUBCUTANEOUS
  Administered 2020-01-18 – 2020-01-19 (×3): 2 [IU] via SUBCUTANEOUS
  Administered 2020-01-19: 20:00:00 3 [IU] via SUBCUTANEOUS
  Administered 2020-01-19: 08:00:00 2 [IU] via SUBCUTANEOUS
  Administered 2020-01-20 (×3): 3 [IU] via SUBCUTANEOUS
  Administered 2020-01-20: 2 [IU] via SUBCUTANEOUS
  Administered 2020-01-20 – 2020-01-21 (×4): 3 [IU] via SUBCUTANEOUS
  Administered 2020-01-21: 04:00:00 5 [IU] via SUBCUTANEOUS

## 2020-01-17 MED ORDER — CHLORHEXIDINE GLUCONATE 0.12% ORAL RINSE (MEDLINE KIT)
15.0000 mL | Freq: Two times a day (BID) | OROMUCOSAL | Status: DC
  Administered 2020-01-17 – 2020-01-24 (×14): 15 mL via OROMUCOSAL

## 2020-01-17 MED ORDER — EPTIFIBATIDE 20 MG/10ML IV SOLN
INTRAVENOUS | Status: AC
  Filled 2020-01-17: qty 10

## 2020-01-17 MED ORDER — EPHEDRINE SULFATE-NACL 50-0.9 MG/10ML-% IV SOSY
PREFILLED_SYRINGE | INTRAVENOUS | Status: DC | PRN
  Administered 2020-01-17: 20 mg via INTRAVENOUS

## 2020-01-17 MED ORDER — ACETAMINOPHEN 325 MG PO TABS: 650.0000 mg | ORAL_TABLET | ORAL | Status: DC | PRN

## 2020-01-17 MED ORDER — ACETAMINOPHEN 160 MG/5ML PO SOLN: 650.0000 mg | ORAL | Status: DC | PRN

## 2020-01-17 MED ORDER — DEXAMETHASONE SODIUM PHOSPHATE 10 MG/ML IJ SOLN
INTRAMUSCULAR | Status: DC | PRN
  Administered 2020-01-17: 5 mg via INTRAVENOUS

## 2020-01-17 MED ORDER — TIROFIBAN HCL IN NACL 5-0.9 MG/100ML-% IV SOLN
INTRAVENOUS | Status: AC
  Filled 2020-01-17: qty 100

## 2020-01-17 MED ORDER — ASPIRIN 81 MG PO CHEW
CHEWABLE_TABLET | ORAL | Status: AC
  Filled 2020-01-17: qty 1

## 2020-01-17 MED ORDER — STROKE: EARLY STAGES OF RECOVERY BOOK: Freq: Once | Status: DC

## 2020-01-17 MED ORDER — NITROGLYCERIN 1 MG/10 ML FOR IR/CATH LAB
INTRA_ARTERIAL | Status: AC
  Filled 2020-01-17: qty 10

## 2020-01-17 MED ORDER — ACETAMINOPHEN 650 MG RE SUPP: 650.0000 mg | RECTAL | Status: DC | PRN

## 2020-01-17 MED ORDER — CLEVIDIPINE BUTYRATE 0.5 MG/ML IV EMUL
0.0000 mg/h | INTRAVENOUS | Status: AC
  Administered 2020-01-17: 2 mg/h via INTRAVENOUS

## 2020-01-17 MED ORDER — IOHEXOL 300 MG/ML  SOLN
150.0000 mL | Freq: Once | INTRAMUSCULAR | Status: AC | PRN
  Administered 2020-01-17: 35 mL via INTRA_ARTERIAL

## 2020-01-17 MED ORDER — CEFAZOLIN SODIUM-DEXTROSE 2-3 GM-%(50ML) IV SOLR
INTRAVENOUS | Status: DC | PRN
  Administered 2020-01-17: 2 g via INTRAVENOUS

## 2020-01-17 MED ORDER — CLOPIDOGREL BISULFATE 300 MG PO TABS
ORAL_TABLET | ORAL | Status: AC
  Filled 2020-01-17: qty 1

## 2020-01-17 MED ORDER — ACETAMINOPHEN 160 MG/5ML PO SOLN
650.0000 mg | ORAL | Status: DC | PRN
  Administered 2020-01-22: 10:00:00 650 mg
  Filled 2020-01-17: qty 20.3

## 2020-01-17 MED ORDER — CHLORHEXIDINE GLUCONATE CLOTH 2 % EX PADS
6.0000 | MEDICATED_PAD | Freq: Every day | CUTANEOUS | Status: DC
  Administered 2020-01-18 – 2020-01-24 (×7): 6 via TOPICAL

## 2020-01-17 MED ORDER — CLEVIDIPINE BUTYRATE 0.5 MG/ML IV EMUL
INTRAVENOUS | Status: AC
  Filled 2020-01-17: qty 50

## 2020-01-17 MED ORDER — ALTEPLASE 30 MG/30 ML FOR INTERV. RAD
1.0000 mg | INTRA_ARTERIAL | Status: DC
  Filled 2020-01-17: qty 30

## 2020-01-17 MED ORDER — ONDANSETRON HCL 4 MG/2ML IJ SOLN
INTRAMUSCULAR | Status: DC | PRN
  Administered 2020-01-17: 4 mg via INTRAVENOUS

## 2020-01-17 MED ORDER — PROPOFOL 500 MG/50ML IV EMUL
INTRAVENOUS | Status: DC | PRN
  Administered 2020-01-17: 75 ug/kg/min via INTRAVENOUS

## 2020-01-17 MED ORDER — PROPOFOL 10 MG/ML IV BOLUS
INTRAVENOUS | Status: DC | PRN
  Administered 2020-01-17: 70 mg via INTRAVENOUS

## 2020-01-17 MED ORDER — DOCUSATE SODIUM 50 MG/5ML PO LIQD
100.0000 mg | Freq: Two times a day (BID) | ORAL | Status: DC
  Administered 2020-01-20: 09:00:00 100 mg via ORAL
  Filled 2020-01-17 (×3): qty 10

## 2020-01-17 MED ORDER — SODIUM CHLORIDE 0.9 % IV SOLN: INTRAVENOUS | Status: DC | PRN

## 2020-01-17 MED ORDER — ROCURONIUM BROMIDE 10 MG/ML (PF) SYRINGE
PREFILLED_SYRINGE | INTRAVENOUS | Status: DC | PRN
  Administered 2020-01-17: 20 mg via INTRAVENOUS
  Administered 2020-01-17: 50 mg via INTRAVENOUS

## 2020-01-17 MED ORDER — POLYETHYLENE GLYCOL 3350 17 G PO PACK
17.0000 g | PACK | Freq: Every day | ORAL | Status: DC
  Administered 2020-01-20 – 2020-01-22 (×3): 17 g via ORAL
  Filled 2020-01-17 (×3): qty 1

## 2020-01-17 MED ORDER — FENTANYL CITRATE (PF) 100 MCG/2ML IJ SOLN
25.0000 ug | INTRAMUSCULAR | Status: DC | PRN
  Administered 2020-01-17: 19:00:00 50 ug via INTRAVENOUS
  Administered 2020-01-17: 100 ug via INTRAVENOUS
  Administered 2020-01-19: 25 ug via INTRAVENOUS
  Administered 2020-01-19: 100 ug via INTRAVENOUS
  Administered 2020-01-19: 25 ug via INTRAVENOUS
  Administered 2020-01-20: 50 ug via INTRAVENOUS
  Administered 2020-01-21 – 2020-01-22 (×5): 100 ug via INTRAVENOUS
  Filled 2020-01-17 (×10): qty 2

## 2020-01-17 MED ORDER — FENTANYL CITRATE (PF) 100 MCG/2ML IJ SOLN
25.0000 ug | INTRAMUSCULAR | Status: DC | PRN
  Administered 2020-01-19: 25 ug via INTRAVENOUS
  Filled 2020-01-17 (×3): qty 2

## 2020-01-17 MED ORDER — PANTOPRAZOLE SODIUM 40 MG IV SOLR
40.0000 mg | Freq: Every day | INTRAVENOUS | Status: DC
  Administered 2020-01-18 – 2020-01-20 (×3): 40 mg via INTRAVENOUS
  Filled 2020-01-17 (×3): qty 40

## 2020-01-17 MED ORDER — TICAGRELOR 90 MG PO TABS
ORAL_TABLET | ORAL | Status: AC
  Filled 2020-01-17: qty 2

## 2020-01-17 MED ORDER — IOHEXOL 300 MG/ML  SOLN
150.0000 mL | Freq: Once | INTRAMUSCULAR | Status: AC | PRN
  Administered 2020-01-17: 75 mL via INTRA_ARTERIAL

## 2020-01-17 MED ORDER — CEFAZOLIN SODIUM-DEXTROSE 2-4 GM/100ML-% IV SOLN
INTRAVENOUS | Status: AC
  Filled 2020-01-17: qty 100

## 2020-01-17 MED ORDER — LIDOCAINE 2% (20 MG/ML) 5 ML SYRINGE
INTRAMUSCULAR | Status: DC | PRN
  Administered 2020-01-17 (×2): 50 mg via INTRAVENOUS

## 2020-01-17 MED ORDER — SODIUM CHLORIDE 0.9% FLUSH: 3.0000 mL | Freq: Once | INTRAVENOUS | Status: DC

## 2020-01-17 MED ORDER — SODIUM BICARBONATE 8.4 % IV SOLN
INTRAVENOUS | Status: AC
  Administered 2020-01-17: 19:00:00 50 meq
  Filled 2020-01-17: qty 50

## 2020-01-17 MED ORDER — SODIUM BICARBONATE 8.4 % IV SOLN
INTRAVENOUS | Status: DC
  Filled 2020-01-17 (×3): qty 100

## 2020-01-17 MED ORDER — PROPOFOL 1000 MG/100ML IV EMUL
5.0000 ug/kg/min | INTRAVENOUS | Status: DC
  Administered 2020-01-18: 5 ug/kg/min via INTRAVENOUS
  Filled 2020-01-17: qty 100

## 2020-01-17 MED ORDER — ALTEPLASE 30 MG/30 ML FOR INTERV. RAD
INTRA_ARTERIAL | Status: DC | PRN
  Administered 2020-01-17 (×5): 1 mg via INTRA_ARTERIAL

## 2020-01-17 MED ORDER — ORAL CARE MOUTH RINSE
15.0000 mL | OROMUCOSAL | Status: DC
  Administered 2020-01-17 – 2020-01-22 (×48): 15 mL via OROMUCOSAL

## 2020-01-17 MED ORDER — VERAPAMIL HCL 2.5 MG/ML IV SOLN
INTRAVENOUS | Status: AC
  Filled 2020-01-17: qty 2

## 2020-01-17 NOTE — Procedures (Signed)
S/P Lt common carotid arteriogram RT CFA approach. Findings. 1.S/P partial recanalization of prox occlusion of inf division of Lt MCA following  Superselective infusion of 5 mg of IA TPA with TICI 2b revascularization. POst CT NO ICH or mass effect.  Distal Tubular hypodensities in M 4 parietal region vacuum phenomenon v air. Patient  left intubated as per anesthesia. RT CFA puncture  closed with an 77F angioseal device. Distal pulses dopplerable DP and PT bilaterally. S.Keyah Blizard MD

## 2020-01-17 NOTE — Code Documentation (Signed)
Pt arrived to [facility] at 1141 today via EMS. Pt was LKW yesterday, 01/16/2020 at 2100. She was found down this morning by her daughters, unable to speak or follow commands. Upon arrival to the ED, pt had a NIHHSS of 28 for aphasia, left gaze preference, rt hemianopia, right facial palsy, generalized weakness of all limbs and inability to move Rt leg. Pt taken to CT at 1145. Pt outside of window for TPA, MD ordered CTA, but there was delay as several RNs were unable to obtain IV access for this. Since physical exam positive for LVO, it was decided to obtain Diagnostic Angiogram with option to intervene if possible. Consent obtained from family. Pt to IR [facility] at 1237 and intubated by CRNA. Report given to [facility] RN in IR. 4 [facility] ICU charge also alerted of this pt.

## 2020-01-17 NOTE — Progress Notes (Signed)
RN asked to reinsert IV team consult once patient returns to the room from OR

## 2020-01-17 NOTE — Anesthesia Procedure Notes (Addendum)
Procedure Name: Intubation Date/Time: 01/17/2020 12:50 PM Performed by: Mariea Clonts, CRNA Pre-anesthesia Checklist: Patient identified, Emergency Drugs available, Suction available and Patient being monitored Patient Re-evaluated:Patient Re-evaluated prior to induction Oxygen Delivery Method: Circle System Utilized Preoxygenation: Pre-oxygenation with 100% oxygen Induction Type: IV induction Laryngoscope Size: Mac and 4 Grade View: Grade II Tube type: Oral Tube size: 7.5 mm Number of attempts: 1 Airway Equipment and Method: Stylet and Oral airway Placement Confirmation: ETT inserted through vocal cords under direct vision,  breath sounds checked- equal and bilateral,  CO2 detector and positive ETCO2 Tube secured with: Tape Dental Injury: Teeth and Oropharynx as per pre-operative assessment

## 2020-01-17 NOTE — ED Provider Notes (Signed)
[place] EMERGENCY DEPARTMENT Provider Note   CSN: [id number] Arrival date & time: 01/17/20  1142     History No chief complaint on file.   Sharon Henderson is a 79 y.o. female. Level 5 caveat due to nonverbal status. HPI Patient came as a code stroke.  Met immediately upon arrival by myself and Dr. Malen Gauze from neurology.  Last normal last night at 9 PM.  Found this morning nonverbal with gaze to the left and right-sided neglect.  LVO positive.  Decreased movement on right side.  CBG was good.    Past Medical History:  Diagnosis Date  . Allergic rhinitis   . Asthma   . Diabetes mellitus without complication ([place])   . Diverticulosis   . Hyperlipidemia   . Hypertension   . Internal hemorrhoids     Patient Active Problem List   Diagnosis Date Noted  . Pain due to onychomycosis of toenails of both feet 04/29/2019  . Osteoarthritis of left knee 06/04/2018  . CKD (chronic kidney disease), stage III 01/17/2017  . Hyperlipidemia associated with type 2 diabetes mellitus ([place]) 01/17/2017  . Bilateral lower extremity edema 01/17/2017  . GERD (gastroesophageal reflux disease) 01/17/2017  . Well controlled type 2 diabetes mellitus with nephropathy ([place]) 05/05/2015  . Benign hypertension with CKD (chronic kidney disease) stage III ([place]) 05/05/2015  . Asthma-chronic obstructive pulmonary disease overlap syndrome ([place]) 08/20/2014    Past Surgical History:  Procedure Laterality Date  . CATARACT EXTRACTION    . COLONOSCOPY       OB History   No obstetric history on file.     Family History  Problem Relation Age of Onset  . Asthma Mother   . Heart disease Mother   . Heart attack Father     Social History   Tobacco Use  . Smoking status: Never Smoker  . Smokeless tobacco: Never Used  Substance Use Topics  . Alcohol use: No    Alcohol/week: 0.0 standard drinks  . Drug use: No    Home Medications Prior to Admission medications   Medication Sig  Start Date End Date Taking? Authorizing Provider  albuterol (VENTOLIN HFA) 108 (90 Base) MCG/ACT inhaler Inhale 1-2 puffs into the lungs every 6 (six) hours as needed. 11/20/19   Karamalegos, Devonne Doughty, DO  budesonide-formoterol (SYMBICORT) 160-4.5 MCG/ACT inhaler INHALE 2 INHALATIONS INTO THE LUNGS 2 (TWO) TIMES DAILY. 11/20/19   Karamalegos, Devonne Doughty, DO  carvedilol (COREG) 25 MG tablet take one tablet by mouth two times daily with a meal 05/10/17   [provider]  cetirizine (ZYRTEC) 5 MG tablet Take 1 tablet (5 mg total) by mouth daily. 07/22/19   Karamalegos, Devonne Doughty, DO  diclofenac Sodium (VOLTAREN) 1 % GEL Apply 2 g topically 3 (three) times daily as needed. For left knee arthritis pain 10/22/19   Karamalegos, Devonne Doughty, DO  fluticasone (FLONASE) 50 MCG/ACT nasal spray INSTILL TWO SPRAYS IN EACH NOSTRIL EVERY DAY 06/17/19   [provider]  furosemide (LASIX) 40 MG tablet Take 1 tablet (40 mg total) by mouth 2 (two) times daily. May take additional tab if worse swelling 11/20/19   Karamalegos, Devonne Doughty, DO  ipratropium-albuterol (DUONEB) 0.5-2.5 (3) MG/3ML SOLN ipratropium 0.5 mg-albuterol 3 mg (2.5 mg base)/3 mL nebulization soln  INHALE ONE vial via nebulizer FOUR TIMES DAILY 06/14/18   [provider]  losartan (COZAAR) 100 MG tablet TAKE ONE TABLET BY MOUTH DAILY 04/01/19   Olin Hauser, DO  metFORMIN (  GLUCOPHAGE) 500 MG tablet TAKE ONE TABLET BY MOUTH DAILY WITH BREAKFAST 04/01/19   Karamalegos, Devonne Doughty, DO  metoprolol tartrate (LOPRESSOR) 100 MG tablet TAKE ONE TABLET BY MOUTH TWICE DAILY 04/01/19   Karamalegos, Devonne Doughty, DO  montelukast (SINGULAIR) 10 MG tablet TAKE ONE TABLET BY MOUTH IN THE EVENING for allergies Patient not taking: Reported on 12/11/2019 04/01/19   Olin Hauser, DO  Multiple Vitamins-Minerals (CENTRUM SILVER 50+WOMEN PO) Take by mouth.    [provider]  simvastatin (ZOCOR) 10 MG tablet TAKE ONE TABLET  BY MOUTH AT BEDTIME 04/01/19   Karamalegos, Alexander J, DO  tiotropium (SPIRIVA HANDIHALER) 18 MCG inhalation capsule Place 1 capsule (18 mcg total) into inhaler and inhale at bedtime. 09/04/19   Olin Hauser, DO    Allergies    Other, Shellfish allergy, and Mold extract [trichophyton]  Review of Systems   Review of Systems  Unable to perform ROS: Patient nonverbal    Physical Exam Updated Vital Signs There were no vitals taken for this visit.  Physical Exam Vitals reviewed.  Constitutional:      Comments: Sitting in bed.  Gaze to left.  HENT:     Head: Atraumatic.     Mouth/Throat:     Mouth: Mucous membranes are moist.  Cardiovascular:     Rate and Rhythm: Regular rhythm.  Pulmonary:     Breath sounds: No wheezing or rhonchi.  Abdominal:     Tenderness: There is no abdominal tenderness.  Musculoskeletal:        General: No tenderness.     Cervical back: Neck supple.  Skin:    General: Skin is warm.  Neurological:     Comments: Patient with gaze to the left.  Threat reflex from left but not to right.  May have right-sided facial droop.  Is moving bilateral arms spontaneously but not to command.  He does have somewhat decreased movements potentially on the right compared to left.  Have not seen much movement on lower extremities however.  Some movement of left foot.  No real response to pain on lower extremities.  Some dried blood on shirt but no clear cause.  Complete NIH scoring done by neurology.     ED Results / Procedures / Treatments   Labs (all labs ordered are listed, but only abnormal results are displayed) Labs Reviewed  COMPREHENSIVE METABOLIC PANEL - Abnormal; Notable for the following components:      Result Value   CO2 20 (*)    Glucose, Bld 173 (*)    BUN 36 (*)    Creatinine, Ser 2.15 (*)    AST 109 (*)    GFR calc non Af Amer 21 (*)    GFR calc Af Amer 24 (*)    All other components within normal limits  CBG MONITORING, ED - Abnormal;  Notable for the following components:   Glucose-Capillary 149 (*)    All other components within normal limits  POCT I-STAT, CHEM 8 - Abnormal; Notable for the following components:   BUN 37 (*)    Creatinine, Ser 2.10 (*)    Glucose, Bld 172 (*)    All other components within normal limits  PROTIME-INR  APTT  CBC  DIFFERENTIAL  I-STAT CHEM 8, ED    EKG None  Radiology CT HEAD CODE STROKE WO CONTRAST  Result Date: 01/17/2020 CLINICAL DATA:  Code stroke. Focal neuro deficit, greater than 6 hours, stroke suspected. Possible stroke. Right-sided facial droop. Last known well  2100. EXAM: CT HEAD WITHOUT CONTRAST TECHNIQUE: Contiguous axial images were obtained from the base of the skull through the vertex without intravenous contrast. COMPARISON:  No pertinent prior studies available for comparison. FINDINGS: Brain: There is no evidence of acute intracranial hemorrhage. There is subtle loss of gray-white differentiation along the posterior aspect of the left insula (series 3, image 13). No other definite loss of gray-white differentiation is identified. There is no evidence of intracranial mass. No midline shift or extra-axial fluid collection. Mild generalized parenchymal atrophy. Vascular: This vessel.  Atherosclerotic calcifications. Skull: Normal. Negative for fracture or focal lesion. Sinuses/Orbits: Visualized orbits demonstrate no acute abnormality. Trace scattered paranasal sinus mucosal thickening. Tiny left ethmoid sinus osteoma. ASPECTS ([place] Stroke Program Early CT Score) - Ganglionic level infarction (caudate, lentiform nuclei, internal capsule, insula, M1-M3 cortex): 6 (point adducted for density within the left insula) - Supraganglionic infarction (M4-M6 cortex): 3 Total score (0-10 with 10 being normal): 9 These results were called by telephone at the time of interpretation on 01/17/2020 at 12:04 pm to provider Dr. Rory Percy, who verbally acknowledged these results. IMPRESSION: 1. Small  focus of acute ischemic infarction changes within the posterior left insula. ASPECTS 9. 2. No evidence of acute intracranial hemorrhage. 3. Mild generalized parenchymal atrophy. Electronically Signed   By: Kellie Simmering DO   On: 01/17/2020 12:05    Procedures Procedures (including critical care time)  Medications Ordered in ED Medications  sodium chloride flush (NS) 0.9 % injection 3 mL (has no administration in time range)  tirofiban (AGGRASTAT) 5-0.9 MG/100ML-% injection (has no administration in time range)  aspirin 81 MG chewable tablet (has no administration in time range)  verapamil (ISOPTIN) 2.5 MG/ML injection (has no administration in time range)  ticagrelor (BRILINTA) 90 MG tablet (has no administration in time range)  clopidogrel (PLAVIX) 300 MG tablet (has no administration in time range)  eptifibatide (INTEGRILIN) 20 MG/10ML injection (has no administration in time range)  ceFAZolin (ANCEF) 2-4 GM/100ML-% IVPB (has no administration in time range)  nitroGLYCERIN 100 mcg/mL intra-arterial injection (has no administration in time range)    ED Course  I have reviewed the triage vital signs and the nursing notes.  Pertinent labs & imaging results that were available during my care of the patient were reviewed by me and considered in my medical decision making (see chart for details).    MDM Rules/Calculators/A&P                      Patient came as a code stroke.  Van positive.  Right-sided neglect and right-sided weakness although does have some movement on both sides.  Initial head CT showed possible acute stroke.  Last normal last night.  Somewhat delayed getting CTA due to difficult IV access and necessity of IV team to get access after difficulty from both myself and nursing.  Taken to interventional radiology.  CRITICAL CARE Performed by: Davonna Belling Total critical care time: 30 minutes Critical care time was exclusive of separately billable procedures and treating  other patients. Critical care was necessary to treat or prevent imminent or life-threatening deterioration. Critical care was time spent personally by me on the following activities: development of treatment plan with patient and/or surrogate as well as nursing, discussions with consultants, evaluation of patient's response to treatment, examination of patient, obtaining history from patient or surrogate, ordering and performing treatments and interventions, ordering and review of laboratory studies, ordering and review of radiographic studies, pulse oximetry and re-evaluation  of patient's condition.  Final Clinical Impression(s) / ED Diagnoses Final diagnoses:  Cerebrovascular accident (CVA), unspecified mechanism [facility])    Rx / [place] Orders ED Discharge Orders    None       Davonna Belling, MD 01/17/20 1404

## 2020-01-17 NOTE — Progress Notes (Signed)
Patient ID: Sharon Henderson, female   DOB: January 08, 1941, 79 y.o.   MRN: [id number] INR. 78 Y RT H F LSW 2100 hrs 01/16/20. New onset RT facial droop and aphasia.. CT brain NO ICH. ASPECTS 9 Due to IV access difficulties NO CTA or CTP performed. The CT and neuro exam findings were D/W  Both daughters. They were informed patients evolving symptoms were most likely due a large vessel occlusion (MCAand or its major branches and/or prox lT ICA  disease.Marland KitchenOption of endovascular revascularization was discussed with the attendant risks of ICH of 10 % ,worsening neuro deficits death,inability to revascularize . They both expressed understanding and provided consent to proceed. S.Collis Thede MD

## 2020-01-17 NOTE — Progress Notes (Signed)
eLink Physician-Brief Progress Note Patient Name: Sharon Henderson DOB: [birth year] MRN: [id number]   Date of Service  01/17/2020  HPI/Events of Note  Sub-optimal sedation on the ventilation with PRN Fentanyl only for sedation.  eICU Interventions  Propofol infusion ordered.        Migdalia Dk 01/17/2020, 8:39 PM

## 2020-01-17 NOTE — Discharge Planning (Signed)
RNCM to follow for disposition needs.  

## 2020-01-17 NOTE — Progress Notes (Signed)
RT transported patient from 4N25 to CT and back with RN. No complications. RT will continue to monitor.   

## 2020-01-17 NOTE — Transfer of Care (Signed)
Immediate Anesthesia Transfer of Care Note  Patient: Sharon Henderson  Procedure(s) Performed: IR WITH ANESTHESIA (N/A )  Patient Location: ICU  Anesthesia Type:General  Level of Consciousness: sedated and Patient remains intubated per anesthesia plan  Airway & Oxygen Therapy: Patient remains intubated per anesthesia plan and Patient placed on Ventilator (see vital sign flow sheet for setting)  Post-op Assessment: Report given to RN and Post -op Vital signs reviewed and stable  Post vital signs: Reviewed and stable  Last Vitals:  Vitals Value Taken Time  BP 148/92 01/17/20 1531  Temp    Pulse 80 01/17/20 1542  Resp 15 01/17/20 1545  SpO2 100 % 01/17/20 1542  Vitals shown include unvalidated device data.  Last Pain: There were no vitals filed for this visit.       Complications: No apparent anesthesia complications

## 2020-01-17 NOTE — Consult Note (Signed)
NAME:  Sharon Henderson, MRN:  [id number], DOB:  [birth year], LOS: 0 ADMISSION DATE:  01/17/2020, CONSULTATION DATE: 4/23 REFERRING MD:  Wayland Salinas, CHIEF COMPLAINT:  Respiratory failure    Brief History   79 year old female admitted 4/23 with left hemisphere stroke.  Was outside of window for systemic TPA, went to interventional radiology for attempted revascularization.  Return to the neuro intensive care on 4/23 on mechanical ventilator, critical care asked to assist with ventilator management and blood pressure control  History of present illness   79 year old female Who was found on the floor at her home by her toilet on 4/23 nonverbal by her daughter.  At baseline she was functional.  CT head showed new acute ischemic infarct involving the left side MCA.  She was outside of window for therapeutic systemic TPA and therefore went to interventional radiology for attempted revascularization -Additional initial NIH scale was 26 Underwent partial recannulation of the proximal left MCA, interventional radiology course notable for prolonged OR time Return to the intensive care on ventilator support critical care service asked to assist with ventilator management as well as blood pressure control -  Past Medical History  Asthma, diabetes without complications, diverticulosis, hyperlipidemia, hypertension, internal hemorrhoids.  Significant Hospital Events   4/23 admitted: Intubated, went to interventional radiology and underwent attempt at revascularization  Consults:  Interventional neuroradiology Critical care  Procedures:  oett 4/23>>>  Significant Diagnostic Tests:  Interventional radiology 4/23: 1.S/P partial recanalization of prox occlusion of inf division of Lt MCA following  Superselective infusion of 5 mg of IA TPA with TICI 2b revascularization. POst CT NO ICH or mass effect.  Distal Tubular hypodensities in M 4 parietal region vacuum phenomenon v air. Patient  left intubated as  per anesthesia. RT CFA puncture  closed with an 79F angioseal device. Distal pulses dopplerable DP and PT bilaterally.  Micro Data:    Antimicrobials:    Interim history/subjective:  Unresponsive   Objective   Blood pressure 122/86, pulse 91, resp. rate 18, height 5\' 6"  (1.676 m), SpO2 100 %.    Vent Mode: PRVC FiO2 (%):  [60 %] 60 % Set Rate:  [15 bmp] 15 bmp Vt Set:  [470 mL] 470 mL PEEP:  [5 cmH20] 5 cmH20 Plateau Pressure:  [15 cmH20] 15 cmH20   Intake/Output Summary (Last 24 hours) at 01/17/2020 1549 Last data filed at 01/17/2020 1514 Gross per 24 hour  Intake 750 ml  Output 5 ml  Net 745 ml   There were no vitals filed for this visit.  Examination: General: 79 year old black female currently on full vent support  HENT: right eye injected and not reactive (apparently surgically induced). Orally intubated. MMM Lungs: clear and decreased  Cardiovascular: RRR no MRG Abdomen: soft, + bowel sounds  Extremities: warm and dry brisk CR + LE edema  Neuro: GCS 3 GU: cl yellow   Resolved Hospital Problem list     Assessment & Plan:   Status post left MCA CVA with attempt at revascularization by interventional radiology Plan Admit to intensive care Serial neuro checks PAD protocol with RASS goal 0 to -1 Blood pressure goal 120-140 per interventional radiology Follow-up echocardiogram Secondary stroke prevention's per neuro to include aspirin and Plavix Statin if LDH greater than 70 Eventually will need swallow eval post extubation  Acute hypoxic respiratory failure w/ Ventilator dependence vent dependent s/p  CVA Plan Cont full vent support F/u abg  VAP bindle  Will assess for SBT 4/24  in collaboration  w/ stroke team. F/u CXR  Chronic renal insufficiency.  It appears as though baseline serum creatinine around 1.8, currently 2.1. -will need to watch scr as received IV dye w/ angiogram  Plan Cont IVFs (currently 150 ml/hr)->cont  Strict I&O Serial  chemistries   DM Plan ssi   Best practice:  Diet: NPO Pain/Anxiety/Delirium protocol (if indicated): 4/23 VAP protocol (if indicated): 4/23 DVT prophylaxis: scd GI prophylaxis: PPI Glucose control: ssi Mobility: BR Code Status: full code  Family Communication: per stroke team  Disposition: icu care  Labs   CBC: Recent Labs  Lab 01/17/20 1153  HGB 13.6  HCT 40.0    Basic Metabolic Panel: Recent Labs  Lab 01/17/20 1149 01/17/20 1153  NA 141 140  K 3.9 3.9  CL 107 106  CO2 20*  --   GLUCOSE 173* 172*  BUN 36* 37*  CREATININE 2.15* 2.10*  CALCIUM 9.4  --    GFR: CrCl cannot be calculated (Unknown ideal weight.). No results for input(s): PROCALCITON, WBC, LATICACIDVEN in the last 168 hours.  Liver Function Tests: Recent Labs  Lab 01/17/20 1149  AST 109*  ALT 39  ALKPHOS 55  BILITOT 0.8  PROT 7.0  ALBUMIN 4.0   No results for input(s): LIPASE, AMYLASE in the last 168 hours. No results for input(s): AMMONIA in the last 168 hours.  ABG    Component Value Date/Time   TCO2 22 01/17/2020 1153     Coagulation Profile: No results for input(s): INR, PROTIME in the last 168 hours.  Cardiac Enzymes: No results for input(s): CKTOTAL, CKMB, CKMBINDEX, TROPONINI in the last 168 hours.  HbA1C: Hemoglobin A1C  Date/Time Value Ref Range Status  03/04/2014 11:42 AM 7.1 (H) 4.2 - 6.3 % Final    Comment:    The American Diabetes Association recommends that a primary goal of therapy should be <7% and that physicians should reevaluate the treatment regimen in patients with HbA1c values consistently >8%.    Hgb A1c MFr Bld  Date/Time Value Ref Range Status  12/06/2019 10:59 AM 6.5 (H) <5.7 % of total Hgb Final    Comment:    For someone without known diabetes, a hemoglobin A1c value of 6.5% or greater indicates that they may have  diabetes and this should be confirmed with a follow-up  test. . For someone with known diabetes, a value <7% indicates  that  their diabetes is well controlled and a value  greater than or equal to 7% indicates suboptimal  control. A1c targets should be individualized based on  duration of diabetes, age, comorbid conditions, and  other considerations. . Currently, no consensus exists regarding use of hemoglobin A1c for diagnosis of diabetes for children. [name]   12/06/2019 10:35 AM CANCELED      Comment:    TEST NOT PERFORMED . Duplicate test.  Result canceled by the ancillary.     CBG: Recent Labs  Lab 01/17/20 1146  GLUCAP 149*    Review of Systems:   Not able   Past Medical History  She,  has a past medical history of Allergic rhinitis, Asthma, Diabetes mellitus without complication (HCC), Diverticulosis, Hyperlipidemia, Hypertension, and Internal hemorrhoids.   Surgical History    Past Surgical History:  Procedure Laterality Date   CATARACT EXTRACTION     COLONOSCOPY       Social History   reports that she has never smoked. She has never used smokeless tobacco. She reports that she does not drink alcohol or use drugs.  Family History   Her family history includes Asthma in her mother; Heart attack in her father; Heart disease in her mother.   Allergies Allergies  Allergen Reactions   Other Anaphylaxis    shellfish    Shellfish Allergy Other (See Comments)   Mold Extract [Trichophyton] Other (See Comments)     Home Medications  Prior to Admission medications   Medication Sig Start Date End Date Taking? Authorizing Provider  albuterol (VENTOLIN HFA) 108 (90 Base) MCG/ACT inhaler Inhale 1-2 puffs into the lungs every 6 (six) hours as needed. 11/20/19   Karamalegos, Devonne Doughty, DO  budesonide-formoterol (SYMBICORT) 160-4.5 MCG/ACT inhaler INHALE 2 INHALATIONS INTO THE LUNGS 2 (TWO) TIMES DAILY. 11/20/19   Karamalegos, Devonne Doughty, DO  carvedilol (COREG) 25 MG tablet take one tablet by mouth two times daily with a meal 05/10/17   [provider]  cetirizine (ZYRTEC) 5 MG  tablet Take 1 tablet (5 mg total) by mouth daily. 07/22/19   Karamalegos, Devonne Doughty, DO  diclofenac Sodium (VOLTAREN) 1 % GEL Apply 2 g topically 3 (three) times daily as needed. For left knee arthritis pain 10/22/19   Karamalegos, Devonne Doughty, DO  fluticasone (FLONASE) 50 MCG/ACT nasal spray INSTILL TWO SPRAYS IN EACH NOSTRIL EVERY DAY 06/17/19   [provider]  furosemide (LASIX) 40 MG tablet Take 1 tablet (40 mg total) by mouth 2 (two) times daily. May take additional tab if worse swelling 11/20/19   Karamalegos, Devonne Doughty, DO  ipratropium-albuterol (DUONEB) 0.5-2.5 (3) MG/3ML SOLN ipratropium 0.5 mg-albuterol 3 mg (2.5 mg base)/3 mL nebulization soln  INHALE ONE vial via nebulizer FOUR TIMES DAILY 06/14/18   [provider]  losartan (COZAAR) 100 MG tablet TAKE ONE TABLET BY MOUTH DAILY 04/01/19   Parks Ranger, Devonne Doughty, DO  metFORMIN (GLUCOPHAGE) 500 MG tablet TAKE ONE TABLET BY MOUTH DAILY WITH BREAKFAST 04/01/19   Karamalegos, Devonne Doughty, DO  metoprolol tartrate (LOPRESSOR) 100 MG tablet TAKE ONE TABLET BY MOUTH TWICE DAILY 04/01/19   Karamalegos, Alexander J, DO  montelukast (SINGULAIR) 10 MG tablet TAKE ONE TABLET BY MOUTH IN THE EVENING for allergies Patient not taking: Reported on 12/11/2019 04/01/19   Olin Hauser, DO  Multiple Vitamins-Minerals (CENTRUM SILVER 50+WOMEN PO) Take by mouth.    [provider]  simvastatin (ZOCOR) 10 MG tablet TAKE ONE TABLET BY MOUTH AT BEDTIME 04/01/19   Karamalegos, Alexander J, DO  tiotropium (SPIRIVA HANDIHALER) 18 MCG inhalation capsule Place 1 capsule (18 mcg total) into inhaler and inhale at bedtime. 09/04/19   Olin Hauser, DO     Critical care time: 32 min     Erick Colace ACNP-BC [facility] Pager # [phone] OR # [phone] if no answer

## 2020-01-17 NOTE — Progress Notes (Addendum)
1600:  Patient arrived to 4NICU.  Sedation turned off for neuro assessment.  Patient not responsive to pain.  Right pupil blown and non reactive to light. Per neurology, patient recently had surgery on right eye.  Unable to get A line for IR, cuff pressure will be used for blood pressure parameter.  IR MD aware.  Groin site level zero during assessment.   1630:  Patient still unresponsive with no sedation.  No corneals noted.  No cough and gag reflex.  Neurology aware.  Would like repeat imaging at 2100/2200 per neurology.  RN to continue to monitor.   1800:  CT scan completed,  Neurology would like MRI scan around 0000-0100, 6 hour from CT scan.  Neurologist updated family.  Critical PPT and PH values relayed to CCM.  New orders received. RT updated.

## 2020-01-17 NOTE — Anesthesia Postprocedure Evaluation (Signed)
Anesthesia Post Note  Patient: Sharon Henderson  Procedure(s) Performed: IR WITH ANESTHESIA (N/A )     Patient location during evaluation: SICU Anesthesia Type: General Level of consciousness: sedated Pain management: pain level controlled Vital Signs Assessment: post-procedure vital signs reviewed and stable Respiratory status: patient remains intubated per anesthesia plan Cardiovascular status: blood pressure returned to baseline and stable Postop Assessment: no apparent nausea or vomiting Anesthetic complications: no    Last Vitals:  Vitals:   01/17/20 1600 01/17/20 1603  BP: (!) 168/98 (!) 166/99  Pulse:    Resp: 15 15  SpO2:      Last Pain: There were no vitals filed for this visit.               Shelton Silvas

## 2020-01-17 NOTE — ED Triage Notes (Signed)
Pt from home, found on ground next to toilet unresponsive. LSW 9PM last night. R facial droop and L gaze seen on arrival. Pt unable to follow commands.

## 2020-01-17 NOTE — Significant Event (Signed)
Resp acidosis  No aline - could not get one  Plan  - bic push x 1 - increase RR 24 - start bic gtt     SIGNATURE    Dr. Kalman Shan, M.D., F.C.C.P,  Pulmonary and Critical Care Medicine Staff Physician, [facility] Director - Interstitial Lung Disease  Program  Pulmonary Fibrosis [facility] [place], [place], [address]  Pager: [phone], If no answer or between  15:00h - 7:00h: call [phone] Telephone: [phone]  6:51 PM 01/17/2020    Recent Labs  Lab 01/17/20 1153 01/17/20 1811  PHART  --  7.179*  PCO2ART  --  53.9*  PO2ART  --  143*  HCO3  --  20.3  TCO2 22  --   O2SAT  --  98.4    No results for input(s): LATICACIDVEN, PROCALCITON in the last 168 hours.

## 2020-01-17 NOTE — H&P (Addendum)
NEURO HOSPITALIST  H&P   Chief Complaint: right facial droop  History obtained from: Family / EMS and chart review HPI:                                                                                                                                         Sharon Henderson is an 79 y.o. female  With PMH HTN, HLD, DM who presented to [facility] ED as a code stroke for right facial droop.   Patient was found on the floor by her toilet not responding to questions. Per daughters. Patient is a resident at an independent living facility. At baseline she is able to talk normally, cook her food, dress herself, toilet herself. She uses a walker intermittently for stabilization, but not everyday. She does not need 24 hour care per daughters.   ED course:  CTH: no hemorrhage; Small focus of acute ischemic infarction changes within the posterior left insula.  Aspects 9. Significant delay in obtaining vascular access.  Peripheral IV for CT perfusion failed x3 including attempt for ultrasound guided access by ED provider and twice by IV team.  BG: 202 BP138/91   Date last known well: 01/16/20 Time last known well: 2100 tPA Given: no; outside of window Modified Rankin: 2 NIHSS:26    Past Medical History:  Diagnosis Date  . Allergic rhinitis   . Asthma   . Diabetes mellitus without complication ([place])   . Diverticulosis   . Hyperlipidemia   . Hypertension   . Internal hemorrhoids     Past Surgical History:  Procedure Laterality Date  . CATARACT EXTRACTION    . COLONOSCOPY      Family History  Problem Relation Age of Onset  . Asthma Mother   . Heart disease Mother   . Heart attack Father        Social History:  reports that she has never smoked. She has never used smokeless tobacco. She reports that she does not drink alcohol or use drugs.  Allergies:  Allergies  Allergen Reactions  . Other Anaphylaxis    shellfish   . Shellfish Allergy Other  (See Comments)  . Mold Extract [Trichophyton] Other (See Comments)    Medications:  Current Facility-Administered Medications  Medication Dose Route Frequency Provider Last Rate Last Admin  . sodium chloride flush (NS) 0.9 % injection 3 mL  3 mL Intravenous Once Benjiman Core, MD       Current Outpatient Medications  Medication Sig Dispense Refill  . albuterol (VENTOLIN HFA) 108 (90 Base) MCG/ACT inhaler Inhale 1-2 puffs into the lungs every 6 (six) hours as needed. 6.7 g 2  . budesonide-formoterol (SYMBICORT) 160-4.5 MCG/ACT inhaler INHALE 2 INHALATIONS INTO THE LUNGS 2 (TWO) TIMES DAILY. 10.2 Inhaler 2  . carvedilol (COREG) 25 MG tablet take one tablet by mouth two times daily with a meal    . cetirizine (ZYRTEC) 5 MG tablet Take 1 tablet (5 mg total) by mouth daily. 90 tablet 3  . diclofenac Sodium (VOLTAREN) 1 % GEL Apply 2 g topically 3 (three) times daily as needed. For left knee arthritis pain 300 g 3  . fluticasone (FLONASE) 50 MCG/ACT nasal spray INSTILL TWO SPRAYS IN EACH NOSTRIL EVERY DAY    . furosemide (LASIX) 40 MG tablet Take 1 tablet (40 mg total) by mouth 2 (two) times daily. May take additional tab if worse swelling 180 tablet 1  . ipratropium-albuterol (DUONEB) 0.5-2.5 (3) MG/3ML SOLN ipratropium 0.5 mg-albuterol 3 mg (2.5 mg base)/3 mL nebulization soln  INHALE ONE vial via nebulizer FOUR TIMES DAILY    . losartan (COZAAR) 100 MG tablet TAKE ONE TABLET BY MOUTH DAILY 90 tablet 3  . metFORMIN (GLUCOPHAGE) 500 MG tablet TAKE ONE TABLET BY MOUTH DAILY WITH BREAKFAST 90 tablet 3  . metoprolol tartrate (LOPRESSOR) 100 MG tablet TAKE ONE TABLET BY MOUTH TWICE DAILY 180 tablet 3  . montelukast (SINGULAIR) 10 MG tablet TAKE ONE TABLET BY MOUTH IN THE EVENING for allergies (Patient not taking: Reported on 12/11/2019) 90 tablet 3  . Multiple Vitamins-Minerals  (CENTRUM SILVER 50+WOMEN PO) Take by mouth.    . simvastatin (ZOCOR) 10 MG tablet TAKE ONE TABLET BY MOUTH AT BEDTIME 90 tablet 3  . tiotropium (SPIRIVA HANDIHALER) 18 MCG inhalation capsule Place 1 capsule (18 mcg total) into inhaler and inhale at bedtime. 30 capsule 0     ROS:                                                                                                                                       History obtained from unobtainable from patient due to mental status     General Examination:  There were no vitals taken for this visit.  Physical Exam  Constitutional: Appears well-developed and well-nourished.  Psych: Affect appropriate to situation Eyes: Normal external eye and conjunctiva. HENT: Normocephalic, no lesions, without obvious abnormality.   Musculoskeletal-no joint tenderness, deformity or swelling Cardiovascular: Normal rate and regular rhythm.  Respiratory: Effort normal, non-labored breathing saturations WNL GI: Soft.  No distension. There is no tenderness.  Skin: WDI  Neurological Examination Mental Status: Awake, global aphasia. Preference to left side Cranial Nerves: right pupil 72mm non reactive (from prior bleed/injury), left pupil 19mm round reactive right hemianopsia blinks to threat from left, left gaze deviation, right facial droop Motor/ sensory: Right leg unable to move, left leg moved to noxious, unable to maintain antigravity. Somewhat neglecting right side.  BUE with drift Cerebellar: Unable to test Gait: deferred   Lab Results: Basic Metabolic Panel: Recent Labs  Lab 01/17/20 1153  NA 140  K 3.9  CL 106  GLUCOSE 172*  BUN 37*  CREATININE 2.10*    CBC: Recent Labs  Lab 01/17/20 1153  HGB 13.6  HCT 40.0    CBG: Recent Labs  Lab 01/17/20 1146  GLUCAP 149*    Imaging: CT HEAD CODE STROKE WO CONTRAST  Result Date:  01/17/2020 CLINICAL DATA:  Code stroke. Focal neuro deficit, greater than 6 hours, stroke suspected. Possible stroke. Right-sided facial droop. Last known well 2100. EXAM: CT HEAD WITHOUT CONTRAST TECHNIQUE: Contiguous axial images were obtained from the base of the skull through the vertex without intravenous contrast. COMPARISON:  No pertinent prior studies available for comparison. FINDINGS: Brain: There is no evidence of acute intracranial hemorrhage. There is subtle loss of gray-white differentiation along the posterior aspect of the left insula (series 3, image 13). No other definite loss of gray-white differentiation is identified. There is no evidence of intracranial mass. No midline shift or extra-axial fluid collection. Mild generalized parenchymal atrophy. Vascular: This vessel.  Atherosclerotic calcifications. Skull: Normal. Negative for fracture or focal lesion. Sinuses/Orbits: Visualized orbits demonstrate no acute abnormality. Trace scattered paranasal sinus mucosal thickening. Tiny left ethmoid sinus osteoma. ASPECTS (Alberta Stroke Program Early CT Score) - Ganglionic level infarction (caudate, lentiform nuclei, internal capsule, insula, M1-M3 cortex): 6 (point adducted for density within the left insula) - Supraganglionic infarction (M4-M6 cortex): 3 Total score (0-10 with 10 being normal): 9 These results were called by telephone at the time of interpretation on 01/17/2020 at 12:04 pm to provider Dr. Wilford Corner, who verbally acknowledged these results. IMPRESSION: 1. Small focus of acute ischemic infarction changes within the posterior left insula. ASPECTS 9. 2. No evidence of acute intracranial hemorrhage. 3. Mild generalized parenchymal atrophy. Electronically Signed   By: Jackey Loge DO   On: 01/17/2020 12:05     Valentina Lucks, MSN, NP-C [facility] [phone]  01/17/2020, 12:06 PM   Attending physician note to follow with Assessment and plan .  Attending addendum Patient  seen and examined as an acute code stroke Comes in with last known normal outside the window for IV TPA but within the window for intervention. Examined at the ER: Exam consistent with a left MCA syndrome. Modified Rankin score of 2. Discussed with the family that she might be a candidate for thrombectomy. Significant delays-at least 30 to 40 minutes in trying to get IV access to obtain CT perfusion imaging. After multiple failed attempts, decision made, after discussing with interventional radiology Dr. Corliss Skains as well as the daughters that we will proceed straight for a diagnostic cerebral angiogram followed  by thrombectomy as deemed appropriate after the diagnostic part of the study. Patient taken in directly to neuro interventional radiology is without doing CT perfusion study.  Assessment: 79 y.o. female With PMH HTN, HLD, DM who presented [facility] ED as a code stroke for right facial droop. Lives independent living facility. Patient is not a candidate for TPA given LKW of 2100 yesterday. Unable to obtain CTA d/t no IV access-significant delays. Patient taken to IR for diagnostic angio plus minus thrombectomy.NIHSS 26 Etiology under investigation  Stroke Risk Factors - diabetes mellitus, hyperlipidemia and hypertension    Plan: -- BP goal : see below --MRI Brain  --Echocardiogram --ASA and plavix -- High intensity Statin if LDL > 70 -- HgbA1c, fasting lipid panel -- PT consult, OT consult, Speech consult --Telemetry monitoring --Frequent neuro checks --Stroke swallow screen   Respiratory Ventilator management-appreciate PCCM consult  HTN Per Neuro IR-with successful revascularization or partial revascularization systolic blood pressure goal 120-140. Then gradually lower to goal of normotensive Please notify Neurologist for SBP < 100  HLD Lipid panel High intensity statin if LDL < 70  DM SSI  Renal CKD 3-gentle hydration  Check morning labs to include CBC BMP Replete  electrolytes as necessary Possible aspiration pneumonia-chest x-ray to be obtained.  Hold off antibiotics for now.   Code status: DNR DVT prophylaxis: SCD's Discharge disposition: TBD  --Please page the Stroke team from 8am-4pm.   You can look them up on [email or web address]     -- Milon DikesAshish Phyllistine Domingos, MD [facility] Pager: [phone] If 7pm to 7am, please call on call as listed on AMION.  CRITICAL CARE ATTESTATION Performed by: Milon DikesAshish Alisia Vanengen, MD Total critical care time: 77 minutes Critical care time was exclusive of separately billable procedures and treating other patients and/or supervising APPs/Residents/Students Critical care was necessary to treat or prevent imminent or life-threatening deterioration due to acute ischemic stroke This patient is critically ill and at significant risk for neurological worsening and/or death and care requires constant monitoring. Critical care was time spent personally by me on the following activities: development of treatment plan with patient and/or surrogate as well as nursing, discussions with consultants, evaluation of patient's response to treatment, examination of patient, obtaining history from patient or surrogate, ordering and performing treatments and interventions, ordering and review of laboratory studies, ordering and review of radiographic studies, pulse oximetry, re-evaluation of patient's condition, participation in multidisciplinary rounds and medical decision making of high complexity in the care of this patient.    Addendum IR procedure complete.  Partial recanalization of the left M1.  Please see Dr. Fatima Sangereveshwar's detailed note. Blood pressure goal systolic 120-140. IV tPA had to be used for the procedure. Contrast staining versus small amount of subarachnoid on the Dyna CT after the procedure. Repeat head CT in 6 hours-no need to repeat head CT if the MRI is done in about 6 hours. Stroke team will follow.   -- Milon DikesAshish Rosamary Boudreau,  MD [facility] Pager: [phone] If 7pm to 7am, please call on call as listed on AMION.    Present on admission: Acute ischemic stroke Diabetes Possible aspiration pneumonia DNR  Addendum 1831 hrs Patient up in the ICU.  Seen and examined. Exam shows no brainstem reflexes. Likely secondary to sedation but will do CT head to confirm no acute process. CT head done stat-reviewed.  Evolving large left MCA stroke involving the insula, parietal lobe and temporal lobe on the left. No evidence of bleed on the CT scan. Repeat exam with  openings of eyes on verbal and noxious stimulation.  No purposeful movement yet. Likely residual sedation effect.   Had a detailed discussion with the family, showed them the images of CT, angiogram and CT post procedure.  Confirmed CODE STATUS-DNR. Continue management as above Stroke team to continue to follow. Plan discussed also with the PCCM consultant Dr. Marchelle Gearing  Additional 20 minutes of critical care time.  -- Milon Dikes, MD [facility]

## 2020-01-17 NOTE — Anesthesia Preprocedure Evaluation (Addendum)
Anesthesia Evaluation  Patient identified by MRN, date of birth, ID band Patient awake    Reviewed: Allergy & Precautions, NPO status , Patient's Chart, lab work & pertinent test results  Airway Mallampati: I  TM Distance: >3 FB Neck ROM: Full    Dental  (+) Upper Dentures, Lower Dentures   Pulmonary asthma , COPD,    breath sounds clear to auscultation       Cardiovascular hypertension,  Rhythm:Regular Rate:Normal     Neuro/Psych negative neurological ROS  negative psych ROS   GI/Hepatic Neg liver ROS, GERD  ,  Endo/Other  diabetes  Renal/GU Renal disease     Musculoskeletal  (+) Arthritis ,   Abdominal Normal abdominal exam  (+)   Peds  Hematology negative hematology ROS (+)   Anesthesia Other Findings   Reproductive/Obstetrics                            Anesthesia Physical Anesthesia Plan  ASA: III and emergent  Anesthesia Plan: General   Post-op Pain Management:    Induction: Rapid sequence and Cricoid pressure planned  PONV Risk Score and Plan: 3 and Ondansetron, Dexamethasone and Midazolam  Airway Management Planned: Oral ETT  Additional Equipment: Arterial line  Intra-op Plan:   Post-operative Plan: Extubation in OR  Informed Consent:     Only emergency history available and History available from chart only  Plan Discussed with: CRNA  Anesthesia Plan Comments:        Anesthesia Quick Evaluation

## 2020-01-18 ENCOUNTER — Inpatient Hospital Stay (HOSPITAL_COMMUNITY): Payer: Medicare HMO

## 2020-01-18 DIAGNOSIS — I6602 Occlusion and stenosis of left middle cerebral artery: Secondary | ICD-10-CM | POA: Diagnosis not present

## 2020-01-18 DIAGNOSIS — I639 Cerebral infarction, unspecified: Secondary | ICD-10-CM | POA: Diagnosis not present

## 2020-01-18 LAB — POCT I-STAT 7, (LYTES, BLD GAS, ICA,H+H)
Acid-base deficit: 2 mmol/L (ref 0.0–2.0)
Bicarbonate: 22.4 mmol/L (ref 20.0–28.0)
Calcium, Ion: 1.18 mmol/L (ref 1.15–1.40)
HCT: 28 % — ABNORMAL LOW (ref 36.0–46.0)
Hemoglobin: 9.5 g/dL — ABNORMAL LOW (ref 12.0–15.0)
O2 Saturation: 99 %
Patient temperature: 98.7
Potassium: 3.2 mmol/L — ABNORMAL LOW (ref 3.5–5.1)
Sodium: 144 mmol/L (ref 135–145)
TCO2: 24 mmol/L (ref 22–32)
pCO2 arterial: 36.5 mmHg (ref 32.0–48.0)
pH, Arterial: 7.396 (ref 7.350–7.450)
pO2, Arterial: 137 mmHg — ABNORMAL HIGH (ref 83.0–108.0)

## 2020-01-18 LAB — CBC WITH DIFFERENTIAL/PLATELET
Abs Immature Granulocytes: 0.12 10*3/uL — ABNORMAL HIGH (ref 0.00–0.07)
Basophils Absolute: 0 10*3/uL (ref 0.0–0.1)
Basophils Relative: 0 %
Eosinophils Absolute: 0 10*3/uL (ref 0.0–0.5)
Eosinophils Relative: 0 %
HCT: 36.4 % (ref 36.0–46.0)
Hemoglobin: 11.4 g/dL — ABNORMAL LOW (ref 12.0–15.0)
Immature Granulocytes: 1 %
Lymphocytes Relative: 8 %
Lymphs Abs: 1.7 10*3/uL (ref 0.7–4.0)
MCH: 31.2 pg (ref 26.0–34.0)
MCHC: 31.3 g/dL (ref 30.0–36.0)
MCV: 99.7 fL (ref 80.0–100.0)
Monocytes Absolute: 1.9 10*3/uL — ABNORMAL HIGH (ref 0.1–1.0)
Monocytes Relative: 9 %
Neutro Abs: 16.8 10*3/uL — ABNORMAL HIGH (ref 1.7–7.7)
Neutrophils Relative %: 82 %
Platelets: 147 10*3/uL — ABNORMAL LOW (ref 150–400)
RBC: 3.65 MIL/uL — ABNORMAL LOW (ref 3.87–5.11)
RDW: 12.6 % (ref 11.5–15.5)
WBC: 20.5 10*3/uL — ABNORMAL HIGH (ref 4.0–10.5)
nRBC: 0 % (ref 0.0–0.2)

## 2020-01-18 LAB — BASIC METABOLIC PANEL
Anion gap: 13 (ref 5–15)
BUN: 30 mg/dL — ABNORMAL HIGH (ref 8–23)
CO2: 21 mmol/L — ABNORMAL LOW (ref 22–32)
Calcium: 8.3 mg/dL — ABNORMAL LOW (ref 8.9–10.3)
Chloride: 110 mmol/L (ref 98–111)
Creatinine, Ser: 1.7 mg/dL — ABNORMAL HIGH (ref 0.44–1.00)
GFR calc Af Amer: 32 mL/min — ABNORMAL LOW (ref >60)
GFR calc non Af Amer: 28 mL/min — ABNORMAL LOW (ref >60)
Glucose, Bld: 130 mg/dL — ABNORMAL HIGH (ref 70–99)
Potassium: 3.8 mmol/L (ref 3.5–5.1)
Sodium: 144 mmol/L (ref 135–145)

## 2020-01-18 LAB — GLUCOSE, CAPILLARY
Glucose-Capillary: 131 mg/dL — ABNORMAL HIGH (ref 70–99)
Glucose-Capillary: 100 mg/dL — ABNORMAL HIGH (ref 70–99)
Glucose-Capillary: 105 mg/dL — ABNORMAL HIGH (ref 70–99)
Glucose-Capillary: 129 mg/dL — ABNORMAL HIGH (ref 70–99)
Glucose-Capillary: 122 mg/dL — ABNORMAL HIGH (ref 70–99)
Glucose-Capillary: 99 mg/dL (ref 70–99)

## 2020-01-18 LAB — LIPID PANEL
Cholesterol: 136 mg/dL (ref 0–200)
HDL: 52 mg/dL (ref >40)
LDL Cholesterol: 72 mg/dL (ref 0–99)
Total CHOL/HDL Ratio: 2.6 RATIO
Triglycerides: 61 mg/dL (ref <150)
VLDL: 12 mg/dL (ref 0–40)

## 2020-01-18 LAB — HEMOGLOBIN A1C
Hgb A1c MFr Bld: 7.2 % — ABNORMAL HIGH (ref 4.8–5.6)
Mean Plasma Glucose: 159.94 mg/dL

## 2020-01-18 MED ORDER — WHITE PETROLATUM EX OINT
TOPICAL_OINTMENT | CUTANEOUS | Status: AC
  Filled 2020-01-18: qty 28.35

## 2020-01-18 MED ORDER — ASPIRIN 81 MG PO CHEW
81.0000 mg | CHEWABLE_TABLET | Freq: Every day | ORAL | Status: DC
  Administered 2020-01-18 – 2020-01-22 (×5): 81 mg
  Filled 2020-01-18 (×5): qty 1

## 2020-01-18 MED ORDER — CLOPIDOGREL BISULFATE 75 MG PO TABS: 75.0000 mg | ORAL_TABLET | Freq: Every day | ORAL | Status: DC

## 2020-01-18 MED ORDER — SIMVASTATIN 20 MG PO TABS
40.0000 mg | ORAL_TABLET | Freq: Every day | ORAL | Status: DC
  Administered 2020-01-18 – 2020-01-21 (×4): 40 mg
  Filled 2020-01-18 (×4): qty 2

## 2020-01-18 MED ORDER — CLOPIDOGREL BISULFATE 75 MG PO TABS
75.0000 mg | ORAL_TABLET | Freq: Every day | ORAL | Status: DC
  Administered 2020-01-18 – 2020-01-22 (×5): 75 mg
  Filled 2020-01-18 (×5): qty 1

## 2020-01-18 NOTE — Progress Notes (Signed)
Patient has a follow MRI order for [facility]. Writer received a call from MRI tech stating that there are 13 MRI stat ordesr and so cannot get to the patient this night. MD made aware.

## 2020-01-18 NOTE — Progress Notes (Signed)
Attempted carotid artery duplex, however patient is not in the room. Will attempt again as schedule permits.  01/18/2020 11:57 AM Eula Fried., MHA, RVT, RDCS, RDMS

## 2020-01-18 NOTE — Progress Notes (Signed)
STROKE TEAM PROGRESS NOTE   HISTORY OF PRESENT ILLNESS (per record) Sharon Henderson is an 79 y.o. female  With PMH HTN, HLD, and DM who presented to [facility] ED as a code stroke for right facial droop.  Patient was found on the floor by her toilet not responding to questions. Per daughters. Patient is a resident at an independent living facility. At baseline she is able to talk normally, cook her food, dress herself, toilet herself. She uses a walker intermittently for stabilization, but not everyday. She does not need 24 hour care per daughters.  ED course:  CTH: no hemorrhage; Small focus of acute ischemic infarction changes within the posterior left insula.  Aspects 9. Significant delay in obtaining vascular access.  Peripheral IV for CT perfusion failed x3 including attempt for ultrasound guided access by ED provider and twice by IV team. BG: 202 BP138/91 Date last known well: 01/16/20 Time last known well: 2100 tPA Given: no; outside of window Modified Rankin: 2 NIHSS:26   INTERVAL HISTORY Her daughter is at bedside. Today she is opening eyes to stimulation, she may be following commands to wiggle toes intermittently. Daughter at bedside. Yesterday I had a long talk with daughters regarding outcome and reviewed images of stroke and they are reasonable and feel if there is no quality of life they will not pursue aggressive measures. Having stroke on the left very worried about aphasia but if patient was following commands this morning with the toes and not coincidental then we may not hve as much aphasia and thought looking at the extensive cortical stroke.    OBJECTIVE Vitals:   01/19/20 0300 01/19/20 0400 01/19/20 0500 01/19/20 0600  BP: 112/61  127/77 134/75  Pulse: 82  79 91  Resp: (!) 24  (!) 24 (!) 24  Temp:  99.1 F (37.3 C)    TempSrc:  Oral    SpO2: 100%  100% 100%  Weight:      Height:        CBC:  Recent Labs  Lab 01/17/20 1618 01/17/20 1618 01/18/20 0626  01/18/20 0626 01/18/20 1418 01/19/20 0040  WBC 14.5*   < > 20.5*  --   --  13.2*  NEUTROABS 13.0*  --  16.8*  --   --   --   HGB 12.2   < > 11.4*   < > 9.5* 9.4*  HCT 39.1   < > 36.4   < > 28.0* 28.7*  MCV 100.5*   < > 99.7  --   --  98.0  PLT 171   < > 147*  --   --  132*   < > = values in this interval not displayed.    Basic Metabolic Panel:  Recent Labs  Lab 01/18/20 0626 01/18/20 0626 01/18/20 1418 01/19/20 0040  NA 144   < > 144 145  K 3.8   < > 3.2* 3.4*  CL 110  --   --  109  CO2 21*  --   --  23  GLUCOSE 130*  --   --  141*  BUN 30*  --   --  25*  CREATININE 1.70*  --   --  1.67*  CALCIUM 8.3*  --   --  8.4*  MG  --   --   --  2.0  PHOS  --   --   --  3.0   < > = values in this interval not displayed.    Lipid  Panel:     Component Value Date/Time   CHOL 136 01/18/2020 0626   CHOL 224 (H) 05/19/2015 1126   TRIG 61 01/18/2020 0626   HDL 52 01/18/2020 0626   HDL 68 05/19/2015 1126   CHOLHDL 2.6 01/18/2020 0626   VLDL 12 01/18/2020 0626   LDLCALC 72 01/18/2020 0626   LDLCALC 73 12/06/2019 1059   HgbA1c:  Lab Results  Component Value Date   HGBA1C 7.2 (H) 01/18/2020   Urine Drug Screen: No results found for: LABOPIA, COCAINSCRNUR, LABBENZ, AMPHETMU, THCU, LABBARB  Alcohol Level No results found for: [facility]  IMAGING  CT HEAD WO CONTRAST 01/17/2020 IMPRESSION:  No hemorrhagic procedural complication. Developing low-density in the left insula, temporal lobe and parietal lobe consistent with developing left MCA infarction.   MRI MRA Head 01/18/2020 IMPRESSION: 1. Fairly extensive cortex infarct in the left MCA territory, also with focal left caudate involvement. Questionable petechial hemorrhage in the posterior left temporal lobe, but no malignant hemorrhagic transformation. And no intracranial mass effect at this time. 2. Motion degraded intracranial MRA is negative for large vessel occlusion. Superior left MCA flow signal compared to the right, which  might be due to luxury perfusion.   CT HEAD CODE STROKE WO CONTRAST 01/17/2020 IMPRESSION:  1. Small focus of acute ischemic infarction changes within the posterior left insula. ASPECTS 9.  2. No evidence of acute intracranial hemorrhage.  3. Mild generalized parenchymal atrophy.   DG Chest Port 1 View 01/17/2020 IMPRESSION:  1. Endotracheal tube above the carina.  2. No acute cardiopulmonary process.   Interventional Neuro Radiology - Cerebral Angiogram with Intervention 01/17/2020 S/P partial recanalization of prox occlusion of inf division of Lt MCA following  Superselective infusion of 5 mg of IA TPA with TICI 2b revascularization.  Transthoracic Echocardiogram  00/00/2021 Pending  Bilateral Carotid Dopplers  00/00/2021 Pending  ECG - pending  PHYSICAL EXAM Blood pressure 134/75, pulse 91, temperature 99.1 F (37.3 C), temperature source Oral, resp. rate (!) 24, height 5\' 6"  (1.676 m), weight 87.2 kg, SpO2 100 %.  Patient opens eyes today to stimulation, may be following intermittent commands to wiggle toes, left eye deviation, right pupil non reactive(chronic) left is reactive and round,  blinks to threat on the left not the right, does not track examiner, does not cross midline, non verbal, right facial droop, withdrawing left > right on stimulation, slight withdrawal uppers to stim. coordination not cooperative. Gait untested.       ASSESSMENT/PLAN Sharon Henderson is a 79 y.o. female with history of HTN, HLD, and DM brought to [facility] ED after she was found on the floor by her toilet not responding to questions with a Rt facial droop. She did not receive IV t-PA due to late presentation (>4.5 hours from time of onset). IR - S/P partial recanalization of prox occlusion of inf division of Lt MCA following superselective infusion of 5 mg of IA TPA with TICI 2b revascularization.  Stroke: Left MCA infarct - likely embolic - source unknown.  Code Stroke CT Head - Small  focus of acute ischemic infarction changes within the posterior left insula. ASPECTS 9.   CT head - Post IR - No hemorrhagic procedural complication. Developing low-density in the left insula, temporal lobe and parietal lobe consistent with developing left MCA infarction.   MRI head - Fairly extensive cortex infarct in the left MCA territory, also with focal left caudate involvement. Questionable petechial hemorrhage in the posterior left temporal lobe, but no  malignant hemorrhagic transformation.   MRA head - Motion degraded intracranial MRA is negative for large vessel occlusion. Superior left MCA flow signal compared to the right, which might be due to luxury perfusion.  CTA H&N - not ordered  CT Perfusion - not ordered  Carotid Doppler - pending  2D Echo - pending  Sars Corona Virus 2 - negative  LDL - 72  HgbA1c - 7.2  UDS - not ordered  VTE prophylaxis - SCDs Diet  Diet Order    None      No antithrombotic prior to admission, now on No antithrombotic -> now on ASA 81 mg daily and Plavix 75 mg daily  Patient will be counseled to be compliant with her antithrombotic medications  Per Dr Aroor - "Contrast staining versus small amount of subarachnoid on the Dyna CT after the procedure. Repeat head CT in 6 hours-no need to repeat head CT if the MRI is done in about 6 hours." ASA and Plavix" once bleed is ruled out.  Ongoing aggressive stroke risk factor management  Therapy recommendations:  CIR recommended (Rehab adm co-ordinater following)  Disposition:  Pending  Hypertension  Home BP meds: Coreg, Lasix, metoprolol and Cozaar  Current BP meds: Cleviprex prn  Systolic blood pressure somewhat low  . Permissive hypertension (OK if < 220/120) but gradually normalize in 5-7 days  . Long-term BP goal normotensive   Hyperlipidemia  Home Lipid lowering medication: Zocor 10 mg daily  LDL 72, goal < 70  Current lipid lowering medication: increase Zocor to 20 mg  daily  Continue statin at discharge  Diabetes  Home diabetic meds: metformin  Current diabetic meds: insulin  HgbA1c 7.2, goal < 7.0 Recent Labs    01/18/20 1946 01/18/20 2323 01/19/20 0323  GLUCAP 122* 99 127*    Other Stroke Risk Factors  Advanced age  Obesity, Body mass index is 31.03 kg/m., recommend weight loss, diet and exercise as appropriate    Other Active Problems  Code status - DNR  CKD - stage 3b - creatinine - 2.15->2.10->1.70->1.67  Leukocytosis - 14.5->20.5->13.2 (temp 99.1) monitor  Hypokalemia - 3.2->3.4 (supplemented by eLink)  Anemia - Hb - 12.2->11.4->9.5->9.4 (post procedure)  Thrombocytopenia - 171->147->132 monitor  Hypocalcemia - 8.3->8.4   Plan   ASA and Plavix started as per Dr Delaney Meigs note.  Labs in AM  Hospital day # 2  Personally examined patient and images, and have participated in and made any corrections needed to history, physical, neuro exam,assessment and plan as stated above.  I have personally obtained the history, evaluated lab date, reviewed imaging studies and agree with radiology interpretations.   This patient is critically ill and at significant risk of neurological worsening, death and care requires constant monitoring of vital signs, hemodynamics,respiratory and cardiac monitoring,review of multiple databases, neurological assessment, discussion with family, other specialists and medical decision making of high complexity.I  I spent 60 minutes of neurocritical care time in the care of this patient.  Naomie Dean, MD [facility]     To contact Stroke Continuity provider, please refer to [email or web address]. After hours, contact General Neurology

## 2020-01-18 NOTE — Evaluation (Signed)
Physical Therapy Evaluation Patient Details Name: Sharon Henderson MRN: [id number] DOB: 03/17/1941 Today's Date: 01/18/2020   History of Present Illness  79 year old female Who was found on the floor at her home by her toilet on 4/23 nonverbal by her daughter. CT head showed new acute ischemic infarct involving the left side MCA. Pt went to IR on 4/23 with partial revascularization of proximal L MCA. Pt remains on vent 4/24.  Clinical Impression  Pt presents to PT with deficits in level of arousal, strength, power, functional mobility, balance, cardiopulmonary function, cognition. Pt is intubated and lethargic, unable to follow commands at this time. Pt with spontaneous movement of LUE/LLE during session. Pt with reflexive movement of RUE when BP taken, moving into shoulder extension. No significant AROM noted in RLE this session. Pt becomes more alert for ~10 second period once transitioning to sitting with eyes opened, however quickly returns to lethargic state. Pt will benefit from continued acute PT POC to improve functional mobility and to attempt to further arouse patient. PT currently recommending CIR with the hope that the pt becomes more alert and better able to tolerate and participate in PT intervention.    Follow Up Recommendations CIR;Supervision/Assistance - 24 hour(if pt progresses with participation)    Equipment Recommendations  Wheelchair (measurements PT);Wheelchair cushion (measurements PT);Other (comment)(hoyer lift, if D/C today)    Recommendations for Other Services       Precautions / Restrictions Precautions Precautions: Fall Precaution Comments: vented Restrictions Weight Bearing Restrictions: No      Mobility  Bed Mobility Overal bed mobility: Needs Assistance Bed Mobility: Supine to Sit;Sit to Supine     Supine to sit: Total assist;+2 for physical assistance Sit to supine: Total assist;+2 for physical assistance      Transfers                     Ambulation/Gait                Stairs            Wheelchair Mobility    Modified Rankin (Stroke Patients Only)       Balance Overall balance assessment: Needs assistance Sitting-balance support: Single extremity supported;Feet unsupported Sitting balance-Leahy Scale: Zero Sitting balance - Comments: totalA Postural control: Posterior lean                                   Pertinent Vitals/Pain Pain Assessment: Faces Faces Pain Scale: Hurts a little bit Pain Location: generalized Pain Descriptors / Indicators: Grimacing Pain Intervention(s): Monitored during session    Home Living Family/patient expects to be discharged to:: Unsure Living Arrangements: Alone Available Help at Discharge: Family;Available PRN/intermittently Type of Home: Independent living facility Home Access: Level entry     Home Layout: One level Home Equipment: Walker - 2 wheels;Shower seat;Grab bars - toilet;Grab bars - tub/shower;Other (comment)(3 point cane)      Prior Function Level of Independence: Independent with assistive device(s)         Comments: utilizies RW in community, otherwise independent without device     Hand Dominance        Extremity/Trunk Assessment   Upper Extremity Assessment Upper Extremity Assessment: Defer to OT evaluation    Lower Extremity Assessment Lower Extremity Assessment: RLE deficits/detail;LLE deficits/detail RLE Deficits / Details: pt with weak withdrawal to painful stimuli, no AROM noted, PROM WFL, sone tone noted in quadriceps and plantar  flexors LLE Deficits / Details: LLE without response to pain this session but does automatically and non-purposefully move during session. With initiation of knee extension pt is able to hold foot off ground in knee extension without support    Cervical / Trunk Assessment Cervical / Trunk Assessment: Normal  Communication   Communication: (intubated, lethargic)  Cognition  Arousal/Alertness: Lethargic Behavior During Therapy: Flat affect Overall Cognitive Status: Difficult to assess                                 General Comments: pt unable to follow commands at this time      General Comments General comments (skin integrity, edema, etc.): pt tachy into low 110s with activity, pt coughing intermittently and attempting to breathe over vent at times during mobility, calms with rest from activity and verbal cues. BP stable when transitioning from supine to sitting. Vent settings: 40% FiO2, 5 PEEP. PT provides education to family on PROM for LEs    Exercises     Assessment/Plan    PT Assessment Patient needs continued PT services  PT Problem List Decreased strength;Decreased activity tolerance;Decreased balance;Decreased mobility;Decreased coordination;Decreased cognition;Decreased knowledge of use of DME;Decreased safety awareness;Decreased knowledge of precautions;Cardiopulmonary status limiting activity;Impaired tone       PT Treatment Interventions DME instruction;Gait training;Functional mobility training;Therapeutic activities;Therapeutic exercise;Balance training;Neuromuscular re-education;Patient/family education;Wheelchair mobility training    PT Goals (Current goals can be found in the Care Plan section)  Acute Rehab PT Goals Patient Stated Goal: Family goal to improve mobility PT Goal Formulation: With family Time For Goal Achievement: 02/01/20 Potential to Achieve Goals: Fair    Frequency Min 3X/week   Barriers to discharge        Co-evaluation               AM-PAC PT "6 Clicks" Mobility  Outcome Measure Help needed turning from your back to your side while in a flat bed without using bedrails?: Total Help needed moving from lying on your back to sitting on the side of a flat bed without using bedrails?: Total Help needed moving to and from a bed to a chair (including a wheelchair)?: Total Help needed standing up  from a chair using your arms (e.g., wheelchair or bedside chair)?: Total Help needed to walk in hospital room?: Total Help needed climbing 3-5 steps with a railing? : Total 6 Click Score: 6    End of Session Equipment Utilized During Treatment: Oxygen Activity Tolerance: Patient limited by lethargy Patient left: in bed;with call bell/phone within reach;with bed alarm set;with family/visitor present Nurse Communication: Mobility status;Need for lift equipment PT Visit Diagnosis: Other symptoms and signs involving the nervous system (R29.898);Muscle weakness (generalized) (M62.81)    Time: 7654-6503 PT Time Calculation (min) (ACUTE ONLY): 28 min   Charges:   PT Evaluation $PT Eval Moderate Complexity: 1 Mod          Zenaida Niece, PT, DPT Acute Rehabilitation Pager: [phone]   Zenaida Niece 01/18/2020, 3:05 PM

## 2020-01-18 NOTE — Consult Note (Signed)
NAME:  Sharon Henderson, MRN:  [id number], DOB:  June 14, 1941, LOS: 1 ADMISSION DATE:  01/17/2020, CONSULTATION DATE: 4/23 REFERRING MD:  Sharon Henderson, CHIEF COMPLAINT:  Respiratory failure    Brief History    79 year old female Who was found on the floor at her home by her toilet on 4/23 nonverbal by her daughter.  At baseline she was functional.  CT head showed new acute ischemic infarct involving the left side MCA.  She was outside of window for therapeutic systemic TPA and therefore went to interventional radiology for attempted revascularization -Additional initial NIH scale was 26 Underwent partial recannulation of the proximal left MCA, interventional radiology course notable for prolonged OR time Return to the intensive care on ventilator support critical care service asked to assist with ventilator management as well as blood pressure control -  Past Medical History  Asthma, diabetes without complications, diverticulosis, hyperlipidemia, hypertension, internal hemorrhoids.  Significant Hospital Events   4/23 admitted: Intubated, went to interventional radiology and underwent attempt at revascularization  Consults:  Interventional neuroradiology Critical care  Procedures:  ett 4/23>>>  Significant Diagnostic Tests:  Interventional radiology 4/23: 1.S/P partial recanalization of prox occlusion of inf division of Lt MCA following  Superselective infusion of 5 mg of IA TPA with TICI 2b revascularization. POst CT NO ICH or mass effect.  Distal Tubular hypodensities in M 4 parietal region vacuum phenomenon v air. Patient  left intubated as per anesthesia. RT CFA puncture  closed with an 73F angioseal device. Distal pulses dopplerable DP and PT bilaterally.  Micro Data:    Antimicrobials:    Interim history/subjective:    4/24 -> returned from MRI. Did PSV l. Downt to 40% fio2. Stil on bic gtt. Not on sedation . Moved Left side and a bit of RLE. Still flaccing on RUE.  AKI  better  Objective   Blood pressure 114/70, pulse (!) 102, temperature 98.7 F (37.1 C), temperature source Axillary, resp. rate (!) 30, height 5\' 6"  (1.676 m), weight 87.2 kg, SpO2 100 %.    Vent Mode: PRVC FiO2 (%):  [40 %-60 %] 40 % Set Rate:  [15 bmp-24 bmp] 24 bmp Vt Set:  [470 mL] 470 mL PEEP:  [5 cmH20] 5 cmH20 Pressure Support:  [10 cmH20] 10 cmH20 Plateau Pressure:  [15 cmH20-18 cmH20] 18 cmH20   Intake/Output Summary (Last 24 hours) at 01/18/2020 1304 Last data filed at 01/18/2020 1200 Gross per 24 hour  Intake 2842.94 ml  Output 605 ml  Net 2237.94 ml   Filed Weights   01/17/20 2020  Weight: 87.2 kg    General Appearance:  Looks criticall ill OBESE - + Head:  Normocephalic, without obvious abnormality, atraumatic Eyes:  PERRL - x, conjunctiva/corneas - muddy     Ears:  Normal external ear canals, both ears Nose:  G tube - no Throat:  ETT TUBE - yes , OG tube - yes Neck:  Supple,  No enlargement/tenderness/nodules Lungs: Clear to auscultation bilaterally, Ventilator   Synchrony - yes, 40% Heart:  S1 and S2 normal, no murmur, CVP - x.  Pressors - no but on bic gtt Abdomen:  Soft, no masses, no organomegaly Genitalia / Rectal:  Not done Extremities:  Extremities- intact Skin:  ntact in exposed areas . Sacral area - not examined Neurologic:  Sedation - none -> RASS - -4, GAG + per Sharon Henderson Problem list     Assessment & Plan:   Status post left MCA CVA  with attempt at revascularization by interventional radiology  01/18/2020 - some improvement in neuro status. MRI done; result pending  Plan Await MRI results; neuro managing Serial neuro checks PAD protocol with RASS goal 0 to -1 Blood pressure goal 120-140 per interventional radiology Secondary stroke prevention's per neuro to include aspirin and Plavix Statin if LDH greater than 70 Eventually will need swallow eval post extubation  Acute hypoxic respiratory failure w/ Ventilator  dependence vent dependent s/p  CVA   01/18/2020 - > does not meet criteria for SBT/Extubation in setting of Acute Respiratory Failure due to coma   Plan Cont full vent support with PSV as tolerated. No extubation VAP bindle  Will assess for SBT 4/24  in collaboration w/ stroke team. F/u CXR  Chronic renal insufficiency.  It appears as though baseline serum creatinine around 1.8, currently 2.1. -will need to watch scr as received IV dye w/ angiogram   01/18/2020 - creat better. Bic still low. Acidosis yesterday  Plan Dc normal saline  Continue bic gt at 50c/h Check ABG 4/24 Strict I&O   Anemia critical illness  01/18/2020 - no bleeding  Plan  - - PRBC for hgb </= 6.9gm%    - exceptions are   -  if ACS susepcted/confirmed then transfuse for hgb </= 8.0gm%,  or    -  active bleeding with hemodynamic instability, then transfuse regardless of hemoglobin value   At at all times try to transfuse 1 unit prbc as possible with exception of active hemorrhage     DM Plan ssi   Best practice:  Diet: NPO Pain/Anxiety/Delirium protocol (if indicated): 4/23 VAP protocol (if indicated): 4/23 DVT prophylaxis: scd GI prophylaxis: PPI Glucose control: ssi Mobility: BR Code Status: full code  Family Communication: per stroke team  But daughters updated from ccm perspective 01/18/20 Disposition: icu care     ATTESTATION & SIGNATURE   The patient Sharon Henderson is critically ill with multiple organ systems failure and requires high complexity decision making for assessment and support, frequent evaluation and titration of therapies, application of advanced monitoring technologies and extensive interpretation of multiple databases.   Critical Care Time devoted to patient care services described in this note is  35  Minutes. This time reflects time of care of this signee Dr Kalman Shan. This critical care time does not reflect procedure time, or teaching time or supervisory time  of PA/NP/Med student/Med Resident etc but could involve care discussion time     Dr. Kalman Shan, M.D., [facility].C.P Pulmonary and Critical Care Medicine Staff Physician [facility]  Pulmonary and Critical Care Pager: [phone], If no answer or between  15:00h - 7:00h: call [phone]  01/18/2020 1:04 PM    LABS    PULMONARY Recent Labs  Lab 01/17/20 1153 01/17/20 1811  PHART  --  7.179*  PCO2ART  --  53.9*  PO2ART  --  143*  HCO3  --  20.3  TCO2 22  --   O2SAT  --  98.4    CBC Recent Labs  Lab 01/17/20 1153 01/17/20 1618 01/18/20 0626  HGB 13.6 12.2 11.4*  HCT 40.0 39.1 36.4  WBC  --  14.5* 20.5*  PLT  --  171 147*    COAGULATION Recent Labs  Lab 01/17/20 1618 01/17/20 2053  INR 1.3* 1.3*    CARDIAC  No results for input(s): TROPONINI in the last 168 hours. No results for input(s): PROBNP in the last 168  hours.   CHEMISTRY Recent Labs  Lab 01/17/20 1149 01/17/20 1149 01/17/20 1153 01/18/20 0626  NA 141  --  140 144  K 3.9   < > 3.9 3.8  CL 107  --  106 110  CO2 20*  --   --  21*  GLUCOSE 173*  --  172* 130*  BUN 36*  --  37* 30*  CREATININE 2.15*  --  2.10* 1.70*  CALCIUM 9.4  --   --  8.3*   < > = values in this interval not displayed.   Estimated Creatinine Clearance: 29.4 mL/min (A) (by C-G formula based on SCr of 1.7 mg/dL (H)).   LIVER Recent Labs  Lab 01/17/20 1149 01/17/20 1618 01/17/20 2053  AST 109*  --   --   ALT 39  --   --   ALKPHOS 55  --   --   BILITOT 0.8  --   --   PROT 7.0  --   --   ALBUMIN 4.0  --   --   INR  --  1.3* 1.3*     INFECTIOUS No results for input(s): LATICACIDVEN, PROCALCITON in the last 168 hours.   ENDOCRINE CBG (last 3)  Recent Labs    01/18/20 0349 01/18/20 0729 01/18/20 1238  GLUCAP 131* 100* 105*         IMAGING x48h  - image(s) personally visualized  -   highlighted in bold CT HEAD WO CONTRAST  Result Date: 01/17/2020 CLINICAL DATA:  Right  facial droop.  Follow-up intervention. EXAM: CT HEAD WITHOUT CONTRAST TECHNIQUE: Contiguous axial images were obtained from the base of the skull through the vertex without intravenous contrast. COMPARISON:  Head CT same day.  Interventional images. FINDINGS: Brain: Brainstem and cerebellum are normal. Right cerebral hemisphere shows mild chronic small-vessel change of the white matter. Left cerebral hemisphere shows increasing low-density in the left insula, temporal lobe and parietal lobe suggesting fairly sizable left MCA infarction. No sign of hemorrhage. No mass effect or shift at this time. No hydrocephalus. Vascular: There is atherosclerotic calcification of the major vessels at the base of the brain. Skull: Negative Sinuses/Orbits: Clear/normal Other: None IMPRESSION: No hemorrhagic procedural complication. Developing low-density in the left insula, temporal lobe and parietal lobe consistent with developing left MCA infarction. Electronically Signed   By: Paulina Fusi M.D.   On: 01/17/2020 18:08   DG Chest Port 1 View  Result Date: 01/18/2020 CLINICAL DATA:  Acute respiratory failure. EXAM: PORTABLE CHEST 1 VIEW COMPARISON:  January 17, 2020 FINDINGS: Scoliosis. Stable cardiomegaly. The hila and mediastinum are unchanged. The ETT is in good position. Mild bibasilar opacities and a possible small left effusion. No other interval changes or acute abnormalities. IMPRESSION: 1. Support apparatus as above. 2. Mild bibasilar opacities may represent atelectasis. There may be a small left effusion as well. Electronically Signed   By: Gerome Sam III M.D   On: 01/18/2020 12:04   DG Chest Port 1 View  Result Date: 01/17/2020 CLINICAL DATA:  79 year old female with code stroke. EXAM: PORTABLE CHEST 1 VIEW COMPARISON:  Chest radiograph dated 06/25/2014. FINDINGS: An endotracheal tube with tip approximately 3 cm above the carina. Minimal left lung base atelectasis. No focal consolidation, pleural effusion,  pneumothorax. There is mild cardiomegaly. No acute osseous pathology. IMPRESSION: 1. Endotracheal tube above the carina. 2. No acute cardiopulmonary process. Electronically Signed   By: Elgie Collard M.D.   On: 01/17/2020 17:31   CT HEAD CODE STROKE  WO CONTRAST  Result Date: 01/17/2020 CLINICAL DATA:  Code stroke. Focal neuro deficit, greater than 6 hours, stroke suspected. Possible stroke. Right-sided facial droop. Last known well 2100. EXAM: CT HEAD WITHOUT CONTRAST TECHNIQUE: Contiguous axial images were obtained from the base of the skull through the vertex without intravenous contrast. COMPARISON:  No pertinent prior studies available for comparison. FINDINGS: Brain: There is no evidence of acute intracranial hemorrhage. There is subtle loss of gray-white differentiation along the posterior aspect of the left insula (series 3, image 13). No other definite loss of gray-white differentiation is identified. There is no evidence of intracranial mass. No midline shift or extra-axial fluid collection. Mild generalized parenchymal atrophy. Vascular: This vessel.  Atherosclerotic calcifications. Skull: Normal. Negative for fracture or focal lesion. Sinuses/Orbits: Visualized orbits demonstrate no acute abnormality. Trace scattered paranasal sinus mucosal thickening. Tiny left ethmoid sinus osteoma. ASPECTS ([place] Stroke Program Early CT Score) - Ganglionic level infarction (caudate, lentiform nuclei, internal capsule, insula, M1-M3 cortex): 6 (point adducted for density within the left insula) - Supraganglionic infarction (M4-M6 cortex): 3 Total score (0-10 with 10 being normal): 9 These results were called by telephone at the time of interpretation on 01/17/2020 at 12:04 pm to provider Dr. Wilford Corner, who verbally acknowledged these results. IMPRESSION: 1. Small focus of acute ischemic infarction changes within the posterior left insula. ASPECTS 9. 2. No evidence of acute intracranial hemorrhage. 3. Mild  generalized parenchymal atrophy. Electronically Signed   By: Jackey Loge DO   On: 01/17/2020 12:05

## 2020-01-18 NOTE — Progress Notes (Signed)
Rehab Admissions Coordinator Note:  Patient was screened by Clois Dupes for appropriateness for an Inpatient Acute Rehab Consult.  At this time, we are recommending await further progress before determinng dispo.  Clois Dupes RN MSN 01/18/2020, 4:57 PM  I can be reached at [phone].

## 2020-01-18 NOTE — Progress Notes (Signed)
RT NOTES: Transported to MRI and back to room 4N25 on vent without complications.

## 2020-01-18 NOTE — Progress Notes (Signed)
Referring Physician(s): Arora,A  Supervising Physician: Julieanne Cotton  Patient Status:  [facility] - In-pt  Chief Complaint: Stroke, right facial droop/aphasia   Subjective: Patient remains intubated, just returned from MRI dept; daughters in room; moving left side and occasional movement from right lower extremity, no right upper extremity movement   Allergies: Other, Shellfish allergy, and Mold extract [trichophyton]  Medications: Prior to Admission medications   Medication Sig Start Date End Date Taking? Authorizing Provider  acetaZOLAMIDE (DIAMOX) 250 MG tablet Take 250 mg by mouth 2 (two) times daily. 01/14/20  Yes [provider]  albuterol (VENTOLIN HFA) 108 (90 Base) MCG/ACT inhaler Inhale 1-2 puffs into the lungs every 6 (six) hours as needed. 11/20/19  Yes Karamalegos, Netta Neat, DO  brimonidine-timolol (COMBIGAN) 0.2-0.5 % ophthalmic solution Place 1 drop into both eyes every 12 (twelve) hours.   Yes [provider]  Brinzolamide-Brimonidine (SIMBRINZA) 1-0.2 % SUSP Place 1 drop into both eyes in the morning and at bedtime.   Yes [provider]  budesonide-formoterol (SYMBICORT) 160-4.5 MCG/ACT inhaler INHALE 2 INHALATIONS INTO THE LUNGS 2 (TWO) TIMES DAILY. Patient taking differently: Inhale 2 puffs into the lungs in the morning and at bedtime.  11/20/19  Yes Karamalegos, Netta Neat, DO  carvedilol (COREG) 25 MG tablet Take 25 mg by mouth 2 (two) times daily with a meal.  05/10/17  Yes [provider]  cetirizine (ZYRTEC) 5 MG tablet Take 1 tablet (5 mg total) by mouth daily. 07/22/19  Yes Karamalegos, Netta Neat, DO  diclofenac Sodium (VOLTAREN) 1 % GEL Apply 2 g topically 3 (three) times daily as needed. For left knee arthritis pain Patient taking differently: Apply 2 g topically 3 (three) times daily as needed (For knee pain).  10/22/19  Yes Karamalegos, Netta Neat, DO  fluticasone (FLONASE) 50 MCG/ACT nasal spray Place 1 spray  into both nostrils daily.  06/17/19  Yes [provider]  furosemide (LASIX) 40 MG tablet Take 1 tablet (40 mg total) by mouth 2 (two) times daily. May take additional tab if worse swelling 11/20/19  Yes Karamalegos, Netta Neat, DO  losartan (COZAAR) 100 MG tablet TAKE ONE TABLET BY MOUTH DAILY Patient taking differently: Take 50 mg by mouth daily.  04/01/19  Yes Karamalegos, Netta Neat, DO  metFORMIN (GLUCOPHAGE) 500 MG tablet TAKE ONE TABLET BY MOUTH DAILY WITH BREAKFAST Patient taking differently: Take 500 mg by mouth daily.  04/01/19  Yes Karamalegos, Netta Neat, DO  metoprolol tartrate (LOPRESSOR) 100 MG tablet TAKE ONE TABLET BY MOUTH TWICE DAILY Patient taking differently: Take 100 mg by mouth 2 (two) times daily.  04/01/19  Yes Karamalegos, Alexander J, DO  montelukast (SINGULAIR) 10 MG tablet TAKE ONE TABLET BY MOUTH IN THE EVENING for allergies Patient taking differently: Take 10 mg by mouth every evening.  04/01/19  Yes Karamalegos, Netta Neat, DO  Multiple Vitamins-Minerals (CENTRUM SILVER 50+WOMEN PO) Take 1 tablet by mouth daily.    Yes [provider]  simvastatin (ZOCOR) 10 MG tablet TAKE ONE TABLET BY MOUTH AT BEDTIME Patient taking differently: Take 10 mg by mouth every evening.  04/01/19  Yes Karamalegos, Alexander J, DO  tiotropium (SPIRIVA HANDIHALER) 18 MCG inhalation capsule Place 1 capsule (18 mcg total) into inhaler and inhale at bedtime. 09/04/19  Yes Karamalegos, Alexander J, DO  Travoprost, BAK Free, (TRAVATAN Z) 0.004 % SOLN ophthalmic solution Place 1 drop into both eyes at bedtime.   Yes [provider]  ipratropium-albuterol (DUONEB) 0.5-2.5 (3) MG/3ML SOLN  Inhale 3 mLs into the lungs every 6 (six) hours as needed (For shortness of breath).  06/14/18   [provider]     Vital Signs: BP 109/79 (BP Location: Right Arm)   Pulse (!) 102   Temp 98.7 F (37.1 C) (Axillary)   Resp 16   Ht 5\' 6"  (1.676 m)   Wt 192 lb 3.9 oz (87.2 kg)    SpO2 100%   BMI 31.03 kg/m   Physical Exam intubated, responds to voice, persistent facial asymmetry, unable to follow commands, movement elicited from left arm and leg as well as right leg but not right arm. Groin site right common femoral artery soft, clean, dry, no hematoma; distal pulses 1-2+ Imaging: CT HEAD WO CONTRAST  Result Date: 01/17/2020 CLINICAL DATA:  Right facial droop.  Follow-up intervention. EXAM: CT HEAD WITHOUT CONTRAST TECHNIQUE: Contiguous axial images were obtained from the base of the skull through the vertex without intravenous contrast. COMPARISON:  Head CT same day.  Interventional images. FINDINGS: Brain: Brainstem and cerebellum are normal. Right cerebral hemisphere shows mild chronic small-vessel change of the white matter. Left cerebral hemisphere shows increasing low-density in the left insula, temporal lobe and parietal lobe suggesting fairly sizable left MCA infarction. No sign of hemorrhage. No mass effect or shift at this time. No hydrocephalus. Vascular: There is atherosclerotic calcification of the major vessels at the base of the brain. Skull: Negative Sinuses/Orbits: Clear/normal Other: None IMPRESSION: No hemorrhagic procedural complication. Developing low-density in the left insula, temporal lobe and parietal lobe consistent with developing left MCA infarction. Electronically Signed   By: 01/19/2020 M.D.   On: 01/17/2020 18:08   DG Chest Port 1 View  Result Date: 01/18/2020 CLINICAL DATA:  Acute respiratory failure. EXAM: PORTABLE CHEST 1 VIEW COMPARISON:  January 17, 2020 FINDINGS: Scoliosis. Stable cardiomegaly. The hila and mediastinum are unchanged. The ETT is in good position. Mild bibasilar opacities and a possible small left effusion. No other interval changes or acute abnormalities. IMPRESSION: 1. Support apparatus as above. 2. Mild bibasilar opacities may represent atelectasis. There may be a small left effusion as well. Electronically Signed   By:  January 19, 2020 III M.D   On: 01/18/2020 12:04   DG Chest Port 1 View  Result Date: 01/17/2020 CLINICAL DATA:  79 year old female with code stroke. EXAM: PORTABLE CHEST 1 VIEW COMPARISON:  Chest radiograph dated 06/25/2014. FINDINGS: An endotracheal tube with tip approximately 3 cm above the carina. Minimal left lung base atelectasis. No focal consolidation, pleural effusion, pneumothorax. There is mild cardiomegaly. No acute osseous pathology. IMPRESSION: 1. Endotracheal tube above the carina. 2. No acute cardiopulmonary process. Electronically Signed   By: 06/27/2014 M.D.   On: 01/17/2020 17:31   CT HEAD CODE STROKE WO CONTRAST  Result Date: 01/17/2020 CLINICAL DATA:  Code stroke. Focal neuro deficit, greater than 6 hours, stroke suspected. Possible stroke. Right-sided facial droop. Last known well 2100. EXAM: CT HEAD WITHOUT CONTRAST TECHNIQUE: Contiguous axial images were obtained from the base of the skull through the vertex without intravenous contrast. COMPARISON:  No pertinent prior studies available for comparison. FINDINGS: Brain: There is no evidence of acute intracranial hemorrhage. There is subtle loss of gray-white differentiation along the posterior aspect of the left insula (series 3, image 13). No other definite loss of gray-white differentiation is identified. There is no evidence of intracranial mass. No midline shift or extra-axial fluid collection. Mild generalized parenchymal atrophy. Vascular: This vessel.  Atherosclerotic calcifications. Skull: Normal. Negative  for fracture or focal lesion. Sinuses/Orbits: Visualized orbits demonstrate no acute abnormality. Trace scattered paranasal sinus mucosal thickening. Tiny left ethmoid sinus osteoma. ASPECTS (Alberta Stroke Program Early CT Score) - Ganglionic level infarction (caudate, lentiform nuclei, internal capsule, insula, M1-M3 cortex): 6 (point adducted for density within the left insula) - Supraganglionic infarction (M4-M6  cortex): 3 Total score (0-10 with 10 being normal): 9 These results were called by telephone at the time of interpretation on 01/17/2020 at 12:04 pm to provider Dr. Wilford Corner, who verbally acknowledged these results. IMPRESSION: 1. Small focus of acute ischemic infarction changes within the posterior left insula. ASPECTS 9. 2. No evidence of acute intracranial hemorrhage. 3. Mild generalized parenchymal atrophy. Electronically Signed   By: Jackey Loge DO   On: 01/17/2020 12:05    Labs:  CBC: Recent Labs    12/06/19 1035 12/06/19 1059 01/17/20 1153 01/17/20 1618 01/18/20 0626  WBC CANCELED 8.3  --  14.5* 20.5*  HGB  --  11.6* 13.6 12.2 11.4*  HCT  --  34.8* 40.0 39.1 36.4  PLT  --  196  --  171 147*    COAGS: Recent Labs    01/17/20 1618 01/17/20 2053  INR 1.3* 1.3*  APTT >200* 46*    BMP: Recent Labs    07/19/19 1004 12/06/19 1035 12/06/19 1059 01/17/20 1149 01/17/20 1153 01/18/20 0626  NA 141  --  140 141 140 144  K 4.4  --  4.5 3.9 3.9 3.8  CL 102  --  103 107 106 110  CO2 29  --  26 20*  --  21*  GLUCOSE 142*   < > 179* 173* 172* 130*  BUN 30*  --  29* 36* 37* 30*  CALCIUM 9.6  --  9.1 9.4  --  8.3*  CREATININE 1.73*  --  1.80* 2.15* 2.10* 1.70*  GFRNONAA 28*  --  26* 21*  --  28*  GFRAA 32*  --  30* 24*  --  32*   < > = values in this interval not displayed.    LIVER FUNCTION TESTS: Recent Labs    12/06/19 1059 01/17/20 1149  BILITOT 0.3 0.8  AST 16 109*  ALT 12 39  ALKPHOS  --  55  PROT 6.8 7.0  ALBUMIN  --  4.0    Assessment and Plan: Patient status post left MCA CVA with partial recanalization of proximal occlusion of inferior division of left MCA followed by superselective infusion of intra-arterial TPA with TICI 2B revascularization; groin access site stable, afebrile, WBC 20.5 up from 14.5, hemoglobin 11.4 down from 12.2, creatinine 1.7 down from 2.1; chest x-ray with mild bibasilar opacities?  Small left effusion; follow-up MRI brain today  revealed: 1. Fairly extensive cortex infarct in the left MCA territory, also with focal left caudate involvement. Questionable petechial hemorrhage in the posterior left temporal lobe, but no malignant hemorrhagic transformation. And no intracranial mass effect at this time.  2. Motion degraded intracranial MRA is negative for large vessel occlusion. Superior left MCA flow signal compared to the right, which might be due to luxury perfusion  Further plans as per neurology/critical care   Electronically Signed: D. Jeananne Rama, PA-C 01/18/2020, 1:44 PM   I spent a total of 15 minutes at the the patient's bedside AND on the patient's hospital floor or unit, greater than 50% of which was counseling/coordinating care for cerebral arteriogram with endovascular intervention    Patient ID: Sharon Henderson, female   DOB:  06/10/1941, 79 y.o.   MRN: [id number]

## 2020-01-18 NOTE — Evaluation (Signed)
Occupational Therapy Evaluation Patient Details Name: Sharon Henderson MRN: [id number] DOB: 1941/01/18 Today's Date: 01/18/2020    History of Present Illness 79 year old female Who was found on the floor at her home by her toilet on 4/23 nonverbal by her daughter. CT head showed new acute ischemic infarct involving the left side MCA. Pt went to IR on 4/23 with partial revascularization of proximal L MCA. Pt remains on vent 4/24.   Clinical Impression   Pt PTA: Pt living at ILF with very supportive family. Pt was independent and cared for self with use RW in community. Pt currently with limitations in cognition, safety, decreased arousal, decreased ability to care for self and to mobilize. Pt not arousable to follow commands and PROM to BUEs. Pain response only to RUE at this time. BP stable when transitioning from supine to sitting. Vent settings: 40% FiO2, 5 PEEP. OT provides education to family on PROM for UEs. Pt would benefit from continued OT skilled services for ADL, mobility and cognition. OT following acutely.  2 daughters present and showed  BUE PROM.   Follow Up Recommendations  CIR    Equipment Recommendations  Other (comment)(defer to next facility)    Recommendations for Other Services Rehab consult     Precautions / Restrictions Precautions Precautions: Fall;Other (comment) Precaution Comments: vented Restrictions Weight Bearing Restrictions: No      Mobility Bed Mobility Overal bed mobility: Needs Assistance Bed Mobility: Supine to Sit;Sit to Supine     Supine to sit: Total assist;+2 for physical assistance Sit to supine: Total assist;+2 for physical assistance   General bed mobility comments: Pt did not initiate movement  Transfers                 General transfer comment: deferred due to lethargy    Balance Overall balance assessment: Needs assistance Sitting-balance support: Single extremity supported;Feet unsupported Sitting balance-Leahy  Scale: Zero Sitting balance - Comments: totalA Postural control: Posterior lean                                 ADL either performed or assessed with clinical judgement   ADL Overall ADL's : Needs assistance/impaired Eating/Feeding: NPO   Grooming: Total assistance   Upper Body Bathing: Total assistance   Lower Body Bathing: Total assistance   Upper Body Dressing : Total assistance   Lower Body Dressing: Total assistance;+2 for physical assistance   Toilet Transfer: Total assistance;+2 for physical assistance;+2 for safety/equipment   Toileting- Clothing Manipulation and Hygiene: Total assistance;+2 for physical assistance;+2 for safety/equipment       Functional mobility during ADLs: Total assistance;+2 for physical assistance;+2 for safety/equipment General ADL Comments: Pt with limitations in cognition, safety, decreased arousal, decreased ability to care for self and to mobilize.     Vision Patient Visual Report: Other (comment)(continue to assess; decreased arousal) Vision Assessment?: Vision impaired- to be further tested in functional context Additional Comments: Pt unable to hold eyes open for >5 secs at a time     Perception     Praxis      Pertinent Vitals/Pain Pain Assessment: Faces Faces Pain Scale: Hurts a little bit Pain Location: generalized Pain Descriptors / Indicators: Grimacing Pain Intervention(s): Monitored during session     Hand Dominance     Extremity/Trunk Assessment Upper Extremity Assessment Upper Extremity Assessment: Generalized weakness;RUE deficits/detail;LUE deficits/detail RUE Deficits / Details: pain response at shoulder elevation and extension, elbow flex, increased  tone noted; PROM, WFLs  RUE Sensation: decreased light touch RUE Coordination: decreased gross motor;decreased fine motor LUE Deficits / Details: pain response; moves spontaneously - not to commands; PROM, WFLs; fair grip strength LUE Coordination:  decreased fine motor;decreased gross motor   Lower Extremity Assessment Lower Extremity Assessment: Defer to PT evaluation RLE Deficits / Details: pt with weak withdrawal to painful stimuli, no AROM noted, PROM WFL, sone tone noted in quadriceps and plantar flexors LLE Deficits / Details: LLE without response to pain this session but does automatically and non-purposefully move during session. With initiation of knee extension pt is able to hold foot off ground in knee extension without support   Cervical / Trunk Assessment Cervical / Trunk Assessment: Normal   Communication Communication Communication: Other (comment)(intubated, lethargic)   Cognition Arousal/Alertness: Lethargic Behavior During Therapy: Flat affect Overall Cognitive Status: Difficult to assess                                 General Comments: pt unable to follow commands at this time   General Comments  pt tachy into low 110s with activity, pt coughing intermittently and attempting to breathe over vent at times during mobility, calms with rest from activity and verbal cues. BP stable when transitioning from supine to sitting. Vent settings: 40% FiO2, 5 PEEP. OT provides education to family on PROM for UEs    Exercises Exercises: Other exercises Other Exercises Other Exercises: BUE PROM shoulder FF, elbow, wrist and hand ROM, 3 reps   Shoulder Instructions      Home Living Family/patient expects to be discharged to:: Unsure Living Arrangements: Alone Available Help at Discharge: Family;Available PRN/intermittently Type of Home: Independent living facility Home Access: Level entry     Home Layout: One level     Bathroom Shower/Tub: Teacher, early years/pre: Standard     Home Equipment: Environmental consultant - 2 wheels;Shower seat;Grab bars - toilet;Grab bars - tub/shower;Other (comment)(3 point cane)          Prior Functioning/Environment Level of Independence: Independent with assistive  device(s)        Comments: utilizes RW in community, otherwise independent without device        OT Problem List: Decreased strength;Decreased activity tolerance;Impaired balance (sitting and/or standing);Decreased coordination;Decreased cognition;Impaired vision/perception;Decreased range of motion;Decreased safety awareness;Impaired UE functional use;Pain;Increased edema;Cardiopulmonary status limiting activity      OT Treatment/Interventions: Self-care/ADL training;Therapeutic exercise;Energy conservation;DME and/or AE instruction;Therapeutic activities;Cognitive remediation/compensation;Visual/perceptual remediation/compensation;Patient/family education;Balance training;Neuromuscular education    OT Goals(Current goals can be found in the care plan section) Acute Rehab OT Goals Patient Stated Goal: Family goal to move well enough for CIR OT Goal Formulation: With patient/family Time For Goal Achievement: 02/01/20 Potential to Achieve Goals: Good ADL Goals Pt Will Perform Grooming: with mod assist;bed level Pt Will Transfer to Toilet: with +2 assist;stand pivot transfer;with mod assist Pt/caregiver will Perform Home Exercise Program: Increased strength;Right Upper extremity;With minimal assist Additional ADL Goal #1: Pt will consistently follow 1 step commands with no cues to attend to task. Additional ADL Goal #2: Pt will perform bed mobility with minA overall as a precursor for OOB ADL.  OT Frequency: Min 2X/week   Barriers to D/C:            Co-evaluation              AM-PAC OT "6 Clicks" Daily Activity     Outcome Measure Help from another  person eating meals?: Total(NPO) Help from another person taking care of personal grooming?: Total Help from another person toileting, which includes using toliet, bedpan, or urinal?: Total Help from another person bathing (including washing, rinsing, drying)?: Total Help from another person to put on and taking off regular upper  body clothing?: Total Help from another person to put on and taking off regular lower body clothing?: Total 6 Click Score: 6   End of Session Equipment Utilized During Treatment: Oxygen Nurse Communication: Mobility status  Activity Tolerance: Patient limited by lethargy;Treatment limited secondary to medical complications (Comment) Patient left: with call bell/phone within reach;with bed alarm set;with family/visitor present;in bed  OT Visit Diagnosis: Unsteadiness on feet (R26.81);Muscle weakness (generalized) (M62.81);Other symptoms and signs involving cognitive function                Time: 6886-4847 OT Time Calculation (min): 35 min Charges:  OT General Charges $OT Visit: 1 Visit OT Evaluation $OT Eval High Complexity: 1 High  Flora Lipps, OTR/L Acute Rehabilitation Services Pager: [phone] Office: [phone]   Disaya Walt C 01/18/2020, 4:44 PM

## 2020-01-18 NOTE — Progress Notes (Signed)
Carotid artery duplex completed. Refer to "CV Proc" under chart review to view preliminary results.  01/18/2020 4:50 PM Eula Fried., MHA, RVT, RDCS, RDMS

## 2020-01-19 ENCOUNTER — Inpatient Hospital Stay (HOSPITAL_COMMUNITY): Payer: Medicare HMO

## 2020-01-19 DIAGNOSIS — I6602 Occlusion and stenosis of left middle cerebral artery: Secondary | ICD-10-CM | POA: Diagnosis not present

## 2020-01-19 DIAGNOSIS — I639 Cerebral infarction, unspecified: Secondary | ICD-10-CM | POA: Diagnosis not present

## 2020-01-19 DIAGNOSIS — I6389 Other cerebral infarction: Secondary | ICD-10-CM

## 2020-01-19 LAB — GLUCOSE, CAPILLARY
Glucose-Capillary: 113 mg/dL — ABNORMAL HIGH (ref 70–99)
Glucose-Capillary: 123 mg/dL — ABNORMAL HIGH (ref 70–99)
Glucose-Capillary: 127 mg/dL — ABNORMAL HIGH (ref 70–99)
Glucose-Capillary: 133 mg/dL — ABNORMAL HIGH (ref 70–99)
Glucose-Capillary: 164 mg/dL — ABNORMAL HIGH (ref 70–99)
Glucose-Capillary: 190 mg/dL — ABNORMAL HIGH (ref 70–99)

## 2020-01-19 LAB — MAGNESIUM: Magnesium: 2 mg/dL (ref 1.7–2.4)

## 2020-01-19 LAB — CBC
HCT: 28.7 % — ABNORMAL LOW (ref 36.0–46.0)
Hemoglobin: 9.4 g/dL — ABNORMAL LOW (ref 12.0–15.0)
MCH: 32.1 pg (ref 26.0–34.0)
MCHC: 32.8 g/dL (ref 30.0–36.0)
MCV: 98 fL (ref 80.0–100.0)
Platelets: 132 10*3/uL — ABNORMAL LOW (ref 150–400)
RBC: 2.93 MIL/uL — ABNORMAL LOW (ref 3.87–5.11)
RDW: 12.8 % (ref 11.5–15.5)
WBC: 13.2 10*3/uL — ABNORMAL HIGH (ref 4.0–10.5)
nRBC: 0 % (ref 0.0–0.2)

## 2020-01-19 LAB — LACTIC ACID, PLASMA
Lactic Acid, Venous: 2.9 mmol/L (ref 0.5–1.9)
Lactic Acid, Venous: 1.7 mmol/L (ref 0.5–1.9)

## 2020-01-19 LAB — COMPREHENSIVE METABOLIC PANEL
ALT: 31 U/L (ref 0–44)
AST: 80 U/L — ABNORMAL HIGH (ref 15–41)
Albumin: 2.9 g/dL — ABNORMAL LOW (ref 3.5–5.0)
Alkaline Phosphatase: 39 U/L (ref 38–126)
Anion gap: 13 (ref 5–15)
BUN: 25 mg/dL — ABNORMAL HIGH (ref 8–23)
CO2: 23 mmol/L (ref 22–32)
Calcium: 8.4 mg/dL — ABNORMAL LOW (ref 8.9–10.3)
Chloride: 109 mmol/L (ref 98–111)
Creatinine, Ser: 1.67 mg/dL — ABNORMAL HIGH (ref 0.44–1.00)
GFR calc Af Amer: 33 mL/min — ABNORMAL LOW (ref >60)
GFR calc non Af Amer: 29 mL/min — ABNORMAL LOW (ref >60)
Glucose, Bld: 141 mg/dL — ABNORMAL HIGH (ref 70–99)
Potassium: 3.4 mmol/L — ABNORMAL LOW (ref 3.5–5.1)
Sodium: 145 mmol/L (ref 135–145)
Total Bilirubin: 0.6 mg/dL (ref 0.3–1.2)
Total Protein: 5.6 g/dL — ABNORMAL LOW (ref 6.5–8.1)

## 2020-01-19 LAB — ECHOCARDIOGRAM COMPLETE
Height: 66 in
Weight: 3075.86 oz

## 2020-01-19 LAB — PHOSPHORUS: Phosphorus: 3 mg/dL (ref 2.5–4.6)

## 2020-01-19 MED ORDER — POTASSIUM CHLORIDE 10 MEQ/100ML IV SOLN
10.0000 meq | INTRAVENOUS | Status: AC
  Administered 2020-01-19 (×2): 10 meq via INTRAVENOUS
  Filled 2020-01-19 (×2): qty 100

## 2020-01-19 MED ORDER — VITAL HIGH PROTEIN PO LIQD
1000.0000 mL | ORAL | Status: DC
  Administered 2020-01-19: 1000 mL

## 2020-01-19 MED ORDER — SODIUM CHLORIDE 0.9 % IV SOLN: INTRAVENOUS | Status: DC | PRN

## 2020-01-19 MED ORDER — PRO-STAT SUGAR FREE PO LIQD
30.0000 mL | Freq: Two times a day (BID) | ORAL | Status: DC
  Administered 2020-01-19 – 2020-01-20 (×3): 30 mL
  Filled 2020-01-19 (×3): qty 30

## 2020-01-19 MED ORDER — PERFLUTREN LIPID MICROSPHERE
1.0000 mL | INTRAVENOUS | Status: DC | PRN
  Administered 2020-01-19: 4 mL via INTRAVENOUS
  Filled 2020-01-19: qty 10

## 2020-01-19 NOTE — Progress Notes (Signed)
eLink Physician-Brief Progress Note Patient Name: Sharon Henderson DOB: Aug 09, 1941 MRN: [id number]   Date of Service  01/19/2020  HPI/Events of Note  K 3.4. Cr 1.7 (but downtrending). No OG/NG. PIVs only.   eICU Interventions  Ordered 20 mEq KCl to be given IV.     Intervention Category Minor Interventions: Electrolytes abnormality - evaluation and management  Marveen Reeks Ladainian Therien 01/19/2020, 2:06 AM

## 2020-01-19 NOTE — Progress Notes (Signed)
SLP Cancellation Note  Patient Details Name: FADIA MARLAR MRN: [id number] DOB: 26-Feb-1941   Cancelled treatment:       Reason Eval/Treat Not Completed: Patient not medically ready(Patient remains intubated, on vent. Will f/u 4/26. )  Ferdinand Lango MA, CCC-SLP   Mya Suell Meryl 01/19/2020, 10:46 AM

## 2020-01-19 NOTE — Progress Notes (Signed)
NAME:  Sharon Henderson, MRN:  [id number], DOB:  04-08-1941, LOS: 2 ADMISSION DATE:  01/17/2020, CONSULTATION DATE: 4/23 REFERRING MD:  Wayland Salinas, CHIEF COMPLAINT:  Respiratory failure    Brief History    79 year old female Who was found on the floor at her home by her toilet on 4/23 nonverbal by her daughter.  At baseline she was functional.  CT head showed new acute ischemic infarct involving the left side MCA.  She was outside of window for therapeutic systemic TPA and therefore went to interventional radiology for attempted revascularization -Additional initial NIH scale was 26 Underwent partial recannulation of the proximal left MCA, interventional radiology course notable for prolonged OR time Return to the intensive care on ventilator support critical care service asked to assist with ventilator management as well as blood pressure control  Past Medical History  Asthma, diabetes without complications, diverticulosis, hyperlipidemia, hypertension, internal hemorrhoids.  Significant Hospital Events   4/23 admitted: Intubated, went to interventional radiology and underwent attempt at revascularization  Consults:  Interventional neuroradiology Critical care  Procedures:  ETT 4/23>>>  Significant Diagnostic Tests:  Interventional radiology 4/23: 1.S/P partial recanalization of prox occlusion of inf division of Lt MCA following  Superselective infusion of 5 mg of IA TPA with TICI 2b revascularization. MRI 4/24 > . Fairly extensive cortex infarct in the left MCA territory, also with focal left caudate involvement. Questionable petechial hemorrhage in the posterior left temporal lobe, but no malignant hemorrhagic transformation. And no intracranial mass effect at this Time. 2. Motion degraded intracranial MRA is negative for large vessel occlusion. Superior left MCA flow signal compared to the right, which might be due to luxury perfusion.  Micro Data:    Antimicrobials:     Interim history/subjective:  No events overnight. On SBT currently   Objective   Blood pressure 123/90, pulse (!) 110, temperature 99.5 F (37.5 C), temperature source Axillary, resp. rate (!) 21, height 5\' 6"  (1.676 m), weight 87.2 kg, SpO2 100 %.    Vent Mode: PSV;CPAP FiO2 (%):  [40 %] 40 % Set Rate:  [24 bmp] 24 bmp Vt Set:  [470 mL] 470 mL PEEP:  [5 cmH20] 5 cmH20 Pressure Support:  [5 cmH20-10 cmH20] 5 cmH20 Plateau Pressure:  [10 cmH20-18 cmH20] 18 cmH20   Intake/Output Summary (Last 24 hours) at 01/19/2020 1627 Last data filed at 01/19/2020 1600 Gross per 24 hour  Intake 1283.37 ml  Output 1325 ml  Net -41.63 ml   Filed Weights   01/17/20 2020  Weight: 87.2 kg    General Appearance:  Elderly female, no distress, on vent  HEENT: ETT/OG in place  Lungs: Clear breath sounds  Heart: RRR, no MRG  Abdomen:  Soft, non-tender, active bowel sounds  Genitalia / Rectal: intact  Extremities:  -edema  Skin: intact Neurologic:  +gag/cough, awakens with verbal stimulation, per nurse follows commands however just received fentanyl, moves left upper and lower, right lower, does not move right upper    Resolved Hospital Problem list     Assessment & Plan:   Status post left MCA CVA with attempt at revascularization by interventional radiology -MRI with extensive cortex infarct in left MCA with focal left caudate involvement  Plan -Per Neurology  -Frequent Neuro Checks  -Okay for Permissive HTN for next 5-7 days  -Continue ASA, Plavix -Continue Zocor (Increased to 20 mg daily)   Acute hypoxic respiratory failure w/ Ventilator dependence vent dependent s/p CVA Plan -Vent Support >> Neuro Status Barrier to Extubation  -  Hopefully can extubate in AM.   Chronic renal insufficiency.  -baseline serum creatinine around 1.8 Plan -Trend BMP  -Replace electrolytes as indicated  -D/C Bicarb gtt   Anemia critical illness Plan -Trend CBC  -Transfuse for hemoglobin <7    DM Plan -Trend Glucose -SSI   Best practice:  Diet: NPO Pain/Anxiety/Delirium protocol (if indicated): 4/23 VAP protocol (if indicated): 4/23 DVT prophylaxis: scd GI prophylaxis: PPI Glucose control: ssi Mobility: BR Code Status: full code  Family Communication: per stroke team. daughters updated at bedside.  Disposition: icu care   Hayden Pedro, AGACNP-BC [facility]  Pgr: [phone]  PCCM Pgr: [phone]

## 2020-01-19 NOTE — Progress Notes (Signed)
  Echocardiogram 2D Echocardiogram with definity has been performed.  Leta Jungling M 01/19/2020, 10:11 AM

## 2020-01-19 NOTE — Progress Notes (Signed)
RT NOTES: Tried patient on pressure support, patient went apneic. Placed back on full support at this time. 

## 2020-01-19 NOTE — Progress Notes (Signed)
CRITICAL VALUE ALERT  Critical Value:  Lactic Acid 2.9   Date & Time Notied:  0134 01/19/23   Provider Notified: Pola Corn  Orders Received/Actions taken: MD notified

## 2020-01-20 ENCOUNTER — Inpatient Hospital Stay (HOSPITAL_COMMUNITY): Payer: Medicare HMO

## 2020-01-20 ENCOUNTER — Encounter: Payer: Self-pay | Admitting: *Deleted

## 2020-01-20 DIAGNOSIS — I639 Cerebral infarction, unspecified: Secondary | ICD-10-CM | POA: Diagnosis not present

## 2020-01-20 DIAGNOSIS — I6602 Occlusion and stenosis of left middle cerebral artery: Secondary | ICD-10-CM | POA: Diagnosis not present

## 2020-01-20 LAB — CBC
HCT: 26.8 % — ABNORMAL LOW (ref 36.0–46.0)
Hemoglobin: 8.5 g/dL — ABNORMAL LOW (ref 12.0–15.0)
MCH: 31.1 pg (ref 26.0–34.0)
MCHC: 31.7 g/dL (ref 30.0–36.0)
MCV: 98.2 fL (ref 80.0–100.0)
Platelets: 136 10*3/uL — ABNORMAL LOW (ref 150–400)
RBC: 2.73 MIL/uL — ABNORMAL LOW (ref 3.87–5.11)
RDW: 13 % (ref 11.5–15.5)
WBC: 11.6 10*3/uL — ABNORMAL HIGH (ref 4.0–10.5)
nRBC: 0 % (ref 0.0–0.2)

## 2020-01-20 LAB — BASIC METABOLIC PANEL
Anion gap: 6 (ref 5–15)
BUN: 27 mg/dL — ABNORMAL HIGH (ref 8–23)
CO2: 28 mmol/L (ref 22–32)
Calcium: 8.4 mg/dL — ABNORMAL LOW (ref 8.9–10.3)
Chloride: 110 mmol/L (ref 98–111)
Creatinine, Ser: 1.34 mg/dL — ABNORMAL HIGH (ref 0.44–1.00)
GFR calc Af Amer: 43 mL/min — ABNORMAL LOW (ref >60)
GFR calc non Af Amer: 37 mL/min — ABNORMAL LOW (ref >60)
Glucose, Bld: 176 mg/dL — ABNORMAL HIGH (ref 70–99)
Potassium: 3.7 mmol/L (ref 3.5–5.1)
Sodium: 144 mmol/L (ref 135–145)

## 2020-01-20 LAB — GLUCOSE, CAPILLARY
Glucose-Capillary: 144 mg/dL — ABNORMAL HIGH (ref 70–99)
Glucose-Capillary: 159 mg/dL — ABNORMAL HIGH (ref 70–99)
Glucose-Capillary: 165 mg/dL — ABNORMAL HIGH (ref 70–99)
Glucose-Capillary: 169 mg/dL — ABNORMAL HIGH (ref 70–99)
Glucose-Capillary: 174 mg/dL — ABNORMAL HIGH (ref 70–99)
Glucose-Capillary: 185 mg/dL — ABNORMAL HIGH (ref 70–99)

## 2020-01-20 LAB — MAGNESIUM: Magnesium: 2.2 mg/dL (ref 1.7–2.4)

## 2020-01-20 LAB — PHOSPHORUS: Phosphorus: 2.4 mg/dL — ABNORMAL LOW (ref 2.5–4.6)

## 2020-01-20 MED ORDER — POTASSIUM PHOSPHATES 15 MMOLE/5ML IV SOLN
10.0000 mmol | Freq: Once | INTRAVENOUS | Status: AC
  Administered 2020-01-20: 10 mmol via INTRAVENOUS
  Filled 2020-01-20: qty 3.33

## 2020-01-20 MED ORDER — DOCUSATE SODIUM 50 MG/5ML PO LIQD
100.0000 mg | Freq: Two times a day (BID) | ORAL | Status: DC
  Administered 2020-01-20 – 2020-01-22 (×4): 100 mg
  Filled 2020-01-20 (×4): qty 10

## 2020-01-20 MED ORDER — PANTOPRAZOLE SODIUM 40 MG PO PACK
40.0000 mg | PACK | Freq: Every day | ORAL | Status: DC
  Administered 2020-01-21 – 2020-01-22 (×2): 40 mg
  Filled 2020-01-20 (×2): qty 20

## 2020-01-20 MED ORDER — VITAL AF 1.2 CAL PO LIQD
1000.0000 mL | ORAL | Status: DC
  Administered 2020-01-20 – 2020-01-21 (×2): 1000 mL

## 2020-01-20 NOTE — Progress Notes (Signed)
EEG complete - results pending 

## 2020-01-20 NOTE — Progress Notes (Signed)
Pt transported to and from CT without event.  Pt sux and ambu bag placed on bed before transport.  RT will continue to monitor.

## 2020-01-20 NOTE — Progress Notes (Addendum)
Referring Physician(s): Code Stroke- Vonzella Nipple  Supervising Physician: Luanne Bras  Patient Status:  [facility] - In-pt  Chief Complaint: None- intubated without sedation  Subjective:  History of CVA s/p cerebral arteriogram with emergent IA tPA administration ingot proximal left MCA inferior division occlusion achieving a TICI 2b revascularization 01/17/2020 by Dr. Estanislado Pandy. Patient laying in bed intubated without sedation. She opens eyes to painful stimuli only. Can spontaneously move left side and RLE, RUE withdraws from pain. Right groin incision c/d/i.  CT head this AM: 1. Continued interval evolution of cytotoxic edema involving the posterior left frontoparietal and temporal regions, consistent with evolving left MCA territory infarct. No significant regional mass effect or midline shift. 2. Question a few scattered areas of supine imposed hyperdensity, suggesting petechial hemorrhage. No frank hemorrhagic transformation.   Allergies: Other, Shellfish allergy, and Mold extract [trichophyton]  Medications: Prior to Admission medications   Medication Sig Start Date End Date Taking? Authorizing Provider  acetaZOLAMIDE (DIAMOX) 250 MG tablet Take 250 mg by mouth 2 (two) times daily. 01/14/20  Yes [provider]  albuterol (VENTOLIN HFA) 108 (90 Base) MCG/ACT inhaler Inhale 1-2 puffs into the lungs every 6 (six) hours as needed. 11/20/19  Yes Karamalegos, Devonne Doughty, DO  brimonidine-timolol (COMBIGAN) 0.2-0.5 % ophthalmic solution Place 1 drop into both eyes every 12 (twelve) hours.   Yes [provider]  Brinzolamide-Brimonidine (SIMBRINZA) 1-0.2 % SUSP Place 1 drop into both eyes in the morning and at bedtime.   Yes [provider]  budesonide-formoterol (SYMBICORT) 160-4.5 MCG/ACT inhaler INHALE 2 INHALATIONS INTO THE LUNGS 2 (TWO) TIMES DAILY. Patient taking differently: Inhale 2 puffs into the lungs in the morning and at bedtime.   11/20/19  Yes Karamalegos, Devonne Doughty, DO  carvedilol (COREG) 25 MG tablet Take 25 mg by mouth 2 (two) times daily with a meal.  05/10/17  Yes [provider]  cetirizine (ZYRTEC) 5 MG tablet Take 1 tablet (5 mg total) by mouth daily. 07/22/19  Yes Karamalegos, Devonne Doughty, DO  diclofenac Sodium (VOLTAREN) 1 % GEL Apply 2 g topically 3 (three) times daily as needed. For left knee arthritis pain Patient taking differently: Apply 2 g topically 3 (three) times daily as needed (For knee pain).  10/22/19  Yes Karamalegos, Devonne Doughty, DO  fluticasone (FLONASE) 50 MCG/ACT nasal spray Place 1 spray into both nostrils daily.  06/17/19  Yes [provider]  furosemide (LASIX) 40 MG tablet Take 1 tablet (40 mg total) by mouth 2 (two) times daily. May take additional tab if worse swelling 11/20/19  Yes Karamalegos, Devonne Doughty, DO  losartan (COZAAR) 100 MG tablet TAKE ONE TABLET BY MOUTH DAILY Patient taking differently: Take 50 mg by mouth daily.  04/01/19  Yes Karamalegos, Devonne Doughty, DO  metFORMIN (GLUCOPHAGE) 500 MG tablet TAKE ONE TABLET BY MOUTH DAILY WITH BREAKFAST Patient taking differently: Take 500 mg by mouth daily.  04/01/19  Yes Karamalegos, Devonne Doughty, DO  metoprolol tartrate (LOPRESSOR) 100 MG tablet TAKE ONE TABLET BY MOUTH TWICE DAILY Patient taking differently: Take 100 mg by mouth 2 (two) times daily.  04/01/19  Yes Karamalegos, Alexander J, DO  montelukast (SINGULAIR) 10 MG tablet TAKE ONE TABLET BY MOUTH IN THE EVENING for allergies Patient taking differently: Take 10 mg by mouth every evening.  04/01/19  Yes Karamalegos, Devonne Doughty, DO  Multiple Vitamins-Minerals (CENTRUM SILVER 50+WOMEN PO) Take 1 tablet by mouth daily.    Yes [provider]  simvastatin (ZOCOR) 10  MG tablet TAKE ONE TABLET BY MOUTH AT BEDTIME Patient taking differently: Take 10 mg by mouth every evening.  04/01/19  Yes Karamalegos, Alexander J, DO  tiotropium (SPIRIVA HANDIHALER) 18 MCG inhalation  capsule Place 1 capsule (18 mcg total) into inhaler and inhale at bedtime. 09/04/19  Yes Karamalegos, Alexander J, DO  Travoprost, BAK Free, (TRAVATAN Z) 0.004 % SOLN ophthalmic solution Place 1 drop into both eyes at bedtime.   Yes [provider]  ipratropium-albuterol (DUONEB) 0.5-2.5 (3) MG/3ML SOLN Inhale 3 mLs into the lungs every 6 (six) hours as needed (For shortness of breath).  06/14/18   [provider]     Vital Signs: BP 132/64 (BP Location: Right Arm)   Pulse 76   Temp 98.5 F (36.9 C) (Axillary)   Resp (!) 24   Ht 5\' 6"  (1.676 m)   Wt 193 lb 2 oz (87.6 kg)   SpO2 100%   BMI 31.17 kg/m   Physical Exam Vitals and nursing note reviewed.  Constitutional:      General: She is not in acute distress.    Comments: Intubated without sedation.  Pulmonary:     Effort: Pulmonary effort is normal. No respiratory distress.     Comments: Intubated without sedation. Skin:    General: Skin is warm and dry.     Comments: Right groin incision soft without active bleeding or hematoma.  Neurological:     Comments: Intubated without sedation. She opens eyes to painful stimuli only. PERRL bilaterally. Can spontaneously move left side and RLE, RUE withdraws from pain. Distal pulses (DPs) 1+ bilaterally.  Psychiatric:     Comments: Intubated without sedation.     Imaging: CT HEAD WO CONTRAST  Result Date: 01/20/2020 CLINICAL DATA:  Follow-up examination for acute stroke. EXAM: CT HEAD WITHOUT CONTRAST TECHNIQUE: Contiguous axial images were obtained from the base of the skull through the vertex without intravenous contrast. COMPARISON:  Comparison made with previous MRI from 01/18/20. FINDINGS: Brain: Increasing hypodensity consisting with evolving cytotoxic edema seen throughout the posterior left frontoparietal and temporal region, consistent with evolving left MCA territory infarct, increased from previous. Additionally, there are a few scattered subtle  hyperdensities seen throughout the area of infarction, suggesting mild petechial hemorrhage. No frank parenchymal hematoma or hemorrhagic transformation. No significant regional mass effect or midline shift. Elsewhere remainder the brain is otherwise stable in appearance. No other new acute large vessel territory infarct or hemorrhage. No hydrocephalus or visible extra-axial fluid collection. Vascular: No hyperdense vessel. Skull: Scalp soft tissues and calvarium are within normal limits. Sinuses/Orbits: Globes and orbital soft tissues demonstrate no acute finding. Mild scattered mucosal thickening noted throughout the paranasal sinuses. Fluid seen within the nasopharynx. Patient is likely intubated. Mastoid air cells remain clear. Other: None. IMPRESSION: 1. Continued interval evolution of cytotoxic edema involving the posterior left frontoparietal and temporal regions, consistent with evolving left MCA territory infarct. No significant regional mass effect or midline shift. 2. Question a few scattered areas of supine imposed hyperdensity, suggesting petechial hemorrhage. No frank hemorrhagic transformation. Electronically Signed   By: Rise Mu M.D.   On: 01/20/2020 02:28   CT HEAD WO CONTRAST  Result Date: 01/17/2020 CLINICAL DATA:  Right facial droop.  Follow-up intervention. EXAM: CT HEAD WITHOUT CONTRAST TECHNIQUE: Contiguous axial images were obtained from the base of the skull through the vertex without intravenous contrast. COMPARISON:  Head CT same day.  Interventional images. FINDINGS: Brain: Brainstem and cerebellum are normal. Right cerebral hemisphere shows mild  chronic small-vessel change of the white matter. Left cerebral hemisphere shows increasing low-density in the left insula, temporal lobe and parietal lobe suggesting fairly sizable left MCA infarction. No sign of hemorrhage. No mass effect or shift at this time. No hydrocephalus. Vascular: There is atherosclerotic calcification  of the major vessels at the base of the brain. Skull: Negative Sinuses/Orbits: Clear/normal Other: None IMPRESSION: No hemorrhagic procedural complication. Developing low-density in the left insula, temporal lobe and parietal lobe consistent with developing left MCA infarction. Electronically Signed   By: Paulina FusiMark  Shogry M.D.   On: 01/17/2020 18:08   MR ANGIO HEAD WO CONTRAST  Result Date: 01/18/2020 CLINICAL DATA:  79 year old female status post code stroke presentation, found down. Status post endovascular revascularization of the left MCA yesterday. EXAM: MRI HEAD WITHOUT CONTRAST MRA HEAD WITHOUT CONTRAST TECHNIQUE: Multiplanar, multiecho pulse sequences of the brain and surrounding structures were obtained without intravenous contrast. Angiographic images of the head were obtained using MRA technique without contrast. COMPARISON:  Presentation head CT 1753 hours yesterday. FINDINGS: Study is intermittently degraded by motion artifact despite repeated imaging attempts. MRI HEAD FINDINGS Brain: There is a 10 cm area of gyriform restricted diffusion in the left MCA territory (series 5, image 79) relatively sparing the left superior frontal gyrus, but with left frontal, superior and lateral left temporal lobe involvement including the insula. Superimposed small area of involvement in the left caudate head. Cytotoxic edema with T2 and FLAIR hyperintensity. Questionable petechial hemorrhage at the posterior temporal lobe on series 15, image 13, but no malignant hemorrhagic transformation. No significant mass effect at this time. No contralateral right hemisphere or posterior fossa restricted diffusion. No chronic encephalomalacia identified. No evidence of mass lesion, ventriculomegaly, extra-axial collection. Cervicomedullary junction and pituitary are within normal limits. Vascular: Major intracranial vascular flow voids are preserved. Skull and upper cervical spine: Negative for age visible cervical spine.  Hyperostosis of the calvarium. Visualized bone marrow signal is within normal limits. Sinuses/Orbits: Postoperative changes to both globes. Trace fluid or mucosal thickening in the hyperplastic sphenoid sinus, stable. Mastoids remain well pneumatized. Other: Intubated.  Scalp and face soft tissues appear negative. MRA HEAD FINDINGS Antegrade flow in the posterior circulation with dominant appearing distal right vertebral artery. The left vertebral appears to functionally terminates in PICA. Motion artifact but no definite distal vertebral stenosis. Patent basilar artery which is diminutive, but without stenosis. Normal SCA origins. Fetal type PCA origins suspected, more so the left. Allowing for motion bilateral PCA branches are within normal limits. Antegrade flow in both ICA siphons. Allowing for motion no siphon stenosis is identified. Patent carotid termini, MCA and ACA origins. A1 segments appear codominant. The right MCA M1 and bifurcation appear patent, although diminutive. There is superior flow signal in the left MCA M1, bifurcation, and visible left MCA branches. Motion artifact degrades branch detail, but no left MCA branch occlusion is evident. IMPRESSION: 1. Fairly extensive cortex infarct in the left MCA territory, also with focal left caudate involvement. Questionable petechial hemorrhage in the posterior left temporal lobe, but no malignant hemorrhagic transformation. And no intracranial mass effect at this time. 2. Motion degraded intracranial MRA is negative for large vessel occlusion. Superior left MCA flow signal compared to the right, which might be due to luxury perfusion. Electronically Signed   By: Odessa FlemingH  Hall M.D.   On: 01/18/2020 13:43   MR BRAIN WO CONTRAST  Result Date: 01/18/2020 CLINICAL DATA:  79 year old female status post code stroke presentation, found down. Status post endovascular  revascularization of the left MCA yesterday. EXAM: MRI HEAD WITHOUT CONTRAST MRA HEAD WITHOUT  CONTRAST TECHNIQUE: Multiplanar, multiecho pulse sequences of the brain and surrounding structures were obtained without intravenous contrast. Angiographic images of the head were obtained using MRA technique without contrast. COMPARISON:  Presentation head CT 1753 hours yesterday. FINDINGS: Study is intermittently degraded by motion artifact despite repeated imaging attempts. MRI HEAD FINDINGS Brain: There is a 10 cm area of gyriform restricted diffusion in the left MCA territory (series 5, image 79) relatively sparing the left superior frontal gyrus, but with left frontal, superior and lateral left temporal lobe involvement including the insula. Superimposed small area of involvement in the left caudate head. Cytotoxic edema with T2 and FLAIR hyperintensity. Questionable petechial hemorrhage at the posterior temporal lobe on series 15, image 13, but no malignant hemorrhagic transformation. No significant mass effect at this time. No contralateral right hemisphere or posterior fossa restricted diffusion. No chronic encephalomalacia identified. No evidence of mass lesion, ventriculomegaly, extra-axial collection. Cervicomedullary junction and pituitary are within normal limits. Vascular: Major intracranial vascular flow voids are preserved. Skull and upper cervical spine: Negative for age visible cervical spine. Hyperostosis of the calvarium. Visualized bone marrow signal is within normal limits. Sinuses/Orbits: Postoperative changes to both globes. Trace fluid or mucosal thickening in the hyperplastic sphenoid sinus, stable. Mastoids remain well pneumatized. Other: Intubated.  Scalp and face soft tissues appear negative. MRA HEAD FINDINGS Antegrade flow in the posterior circulation with dominant appearing distal right vertebral artery. The left vertebral appears to functionally terminates in PICA. Motion artifact but no definite distal vertebral stenosis. Patent basilar artery which is diminutive, but without  stenosis. Normal SCA origins. Fetal type PCA origins suspected, more so the left. Allowing for motion bilateral PCA branches are within normal limits. Antegrade flow in both ICA siphons. Allowing for motion no siphon stenosis is identified. Patent carotid termini, MCA and ACA origins. A1 segments appear codominant. The right MCA M1 and bifurcation appear patent, although diminutive. There is superior flow signal in the left MCA M1, bifurcation, and visible left MCA branches. Motion artifact degrades branch detail, but no left MCA branch occlusion is evident. IMPRESSION: 1. Fairly extensive cortex infarct in the left MCA territory, also with focal left caudate involvement. Questionable petechial hemorrhage in the posterior left temporal lobe, but no malignant hemorrhagic transformation. And no intracranial mass effect at this time. 2. Motion degraded intracranial MRA is negative for large vessel occlusion. Superior left MCA flow signal compared to the right, which might be due to luxury perfusion. Electronically Signed   By: Odessa Fleming M.D.   On: 01/18/2020 13:43   DG CHEST PORT 1 VIEW  Result Date: 01/20/2020 CLINICAL DATA:  Hypoxia EXAM: PORTABLE CHEST 1 VIEW COMPARISON:  January 19, 2020 FINDINGS: Endotracheal tube tip is 2.7 cm above the carina. Nasogastric tube tip and side port are below the diaphragm. No pneumothorax. There is bibasilar atelectasis, slightly more notable on the left than on the right. Lungs elsewhere are clear. Heart is upper normal in size with pulmonary vascularity normal. No adenopathy. There is lower thoracic dextroscoliosis. IMPRESSION: Tube positions as described without pneumothorax. Bibasilar atelectasis. Lungs elsewhere clear. Stable cardiac silhouette. Electronically Signed   By: Bretta Bang III M.D.   On: 01/20/2020 08:07   DG CHEST PORT 1 VIEW  Result Date: 01/19/2020 CLINICAL DATA:  Code stroke. EXAM: PORTABLE CHEST 1 VIEW COMPARISON:  January 18, 2020 FINDINGS: The heart  size is borderline and stable. The hila and  mediastinum are unchanged. The ET tube is in good position. The NG tube terminates below today's film. Atelectasis in the left base. No pneumothorax. No other abnormalities. IMPRESSION: 1. Support apparatus as above. 2. Atelectasis in the left base. 3. No other changes. Electronically Signed   By: Gerome Sam III M.D   On: 01/19/2020 11:55   DG Chest Port 1 View  Result Date: 01/18/2020 CLINICAL DATA:  Acute respiratory failure. EXAM: PORTABLE CHEST 1 VIEW COMPARISON:  January 17, 2020 FINDINGS: Scoliosis. Stable cardiomegaly. The hila and mediastinum are unchanged. The ETT is in good position. Mild bibasilar opacities and a possible small left effusion. No other interval changes or acute abnormalities. IMPRESSION: 1. Support apparatus as above. 2. Mild bibasilar opacities may represent atelectasis. There may be a small left effusion as well. Electronically Signed   By: Gerome Sam III M.D   On: 01/18/2020 12:04   DG Chest Port 1 View  Result Date: 01/17/2020 CLINICAL DATA:  79 year old female with code stroke. EXAM: PORTABLE CHEST 1 VIEW COMPARISON:  Chest radiograph dated 06/25/2014. FINDINGS: An endotracheal tube with tip approximately 3 cm above the carina. Minimal left lung base atelectasis. No focal consolidation, pleural effusion, pneumothorax. There is mild cardiomegaly. No acute osseous pathology. IMPRESSION: 1. Endotracheal tube above the carina. 2. No acute cardiopulmonary process. Electronically Signed   By: Elgie Collard M.D.   On: 01/17/2020 17:31   DG Abd Portable 1V  Result Date: 01/18/2020 CLINICAL DATA:  79 year old female status post enteric tube placement. EXAM: PORTABLE ABDOMEN - 1 VIEW COMPARISON:  CT abdomen pelvis dated 01/19/2012. FINDINGS: Enteric tube is noted with tip likely in the distal stomach. Air is noted within the colon. IMPRESSION: Enteric tube with tip in the distal stomach. Electronically Signed   By: Elgie Collard M.D.   On: 01/18/2020 17:25   ECHOCARDIOGRAM COMPLETE  Result Date: 01/19/2020    ECHOCARDIOGRAM REPORT   Patient Name:   Sharon Henderson Sharon Henderson Date of Exam: 01/19/2020 Medical Rec #:  [id number]      Height:       66.0 in Accession #:    [id number]     Weight:       192.2 lb Date of Birth:  1940-11-07      BSA:          1.966 m Patient Age:    78 years       BP:           131/74 mmHg Patient Gender: F              HR:           85 bpm. Exam Location:  Inpatient Procedure: 2D Echo and Intracardiac Opacification Agent Indications:    Stroke 434.91 / I163.9  History:        Patient has no prior history of Echocardiogram examinations.                 Risk Factors:Hypertension, Diabetes and Dyslipidemia. Asthma.  Sonographer:    Leta Jungling RDCS Referring Phys: [id number] PETER E BABCOCK  Sonographer Comments: Echo performed with patient supine and on artificial respirator. IMPRESSIONS  1. Left ventricular ejection fraction, by estimation, is 60 to 65%. The left ventricle has normal function. The left ventricle has no regional wall motion abnormalities. There is mild left ventricular hypertrophy of the basal-septal segment. Left ventricular diastolic parameters are consistent with Grade I diastolic dysfunction (impaired relaxation).  2. Right ventricular systolic function is normal.  The right ventricular size is moderately enlarged.  3. The mitral valve is normal in structure. No evidence of mitral valve regurgitation. No evidence of mitral stenosis.  4. The aortic valve is normal in structure. Aortic valve regurgitation is not visualized. Mild to moderate aortic valve sclerosis/calcification is present, without any evidence of aortic stenosis.  5. The inferior vena cava is normal in size with greater than 50% respiratory variability, suggesting right atrial pressure of 3 mmHg. Conclusion(s)/Recommendation(s): No intracardiac source of embolism detected on this transthoracic study. A transesophageal echocardiogram is  recommended to exclude cardiac source of embolism if clinically indicated. FINDINGS  Left Ventricle: Left ventricular ejection fraction, by estimation, is 60 to 65%. The left ventricle has normal function. The left ventricle has no regional wall motion abnormalities. Definity contrast agent was given IV to delineate the left ventricular  endocardial borders. The left ventricular internal cavity size was normal in size. There is mild left ventricular hypertrophy of the basal-septal segment. Left ventricular diastolic parameters are consistent with Grade I diastolic dysfunction (impaired relaxation). Right Ventricle: The right ventricular size is moderately enlarged. No increase in right ventricular wall thickness. Right ventricular systolic function is normal. Left Atrium: Left atrial size was normal in size. Right Atrium: Right atrial size was normal in size. Pericardium: A small pericardial effusion is present. The pericardial effusion is anterior to the right ventricle. The pericardial effusion appears to contain fibrous material. There is no evidence of cardiac tamponade. Mitral Valve: The mitral valve is normal in structure. Normal mobility of the mitral valve leaflets. No evidence of mitral valve regurgitation. No evidence of mitral valve stenosis. Tricuspid Valve: The tricuspid valve is normal in structure. Tricuspid valve regurgitation is not demonstrated. No evidence of tricuspid stenosis. Aortic Valve: The aortic valve is normal in structure. Aortic valve regurgitation is not visualized. Mild to moderate aortic valve sclerosis/calcification is present, without any evidence of aortic stenosis. Pulmonic Valve: The pulmonic valve was normal in structure. Pulmonic valve regurgitation is trivial. No evidence of pulmonic stenosis. Aorta: The aortic root is normal in size and structure. Venous: The inferior vena cava is normal in size with greater than 50% respiratory variability, suggesting right atrial pressure  of 3 mmHg. IAS/Shunts: No atrial level shunt detected by color flow Doppler.  LEFT VENTRICLE PLAX 2D LVIDd:         4.40 cm  Diastology LVIDs:         3.00 cm  LV e' lateral:   7.72 cm/s LV PW:         0.80 cm  LV E/e' lateral: 5.3 LV IVS:        1.20 cm  LV e' medial:    4.90 cm/s LVOT diam:     2.10 cm  LV E/e' medial:  8.3 LVOT Area:     3.46 cm  RIGHT VENTRICLE             IVC RV S prime:     13.30 cm/s  IVC diam: 2.30 cm TAPSE (M-mode): 2.0 cm LEFT ATRIUM         Index LA diam:    3.40 cm 1.73 cm/m   AORTA Ao Root diam: 3.50 cm MITRAL VALVE MV Area (PHT): 5.97 cm    SHUNTS MV Decel Time: 127 msec    Systemic Diam: 2.10 cm MV E velocity: 40.90 cm/s MV A velocity: 36.00 cm/s MV E/A ratio:  1.14 MV A Prime:    7.9 cm/s Donato Schultz MD Electronically signed  by Donato Schultz MD Signature Date/Time: 01/19/2020/1:31:26 PM    Final    CT HEAD CODE STROKE WO CONTRAST  Result Date: 01/17/2020 CLINICAL DATA:  Code stroke. Focal neuro deficit, greater than 6 hours, stroke suspected. Possible stroke. Right-sided facial droop. Last known well 2100. EXAM: CT HEAD WITHOUT CONTRAST TECHNIQUE: Contiguous axial images were obtained from the base of the skull through the vertex without intravenous contrast. COMPARISON:  No pertinent prior studies available for comparison. FINDINGS: Brain: There is no evidence of acute intracranial hemorrhage. There is subtle loss of gray-white differentiation along the posterior aspect of the left insula (series 3, image 13). No other definite loss of gray-white differentiation is identified. There is no evidence of intracranial mass. No midline shift or extra-axial fluid collection. Mild generalized parenchymal atrophy. Vascular: This vessel.  Atherosclerotic calcifications. Skull: Normal. Negative for fracture or focal lesion. Sinuses/Orbits: Visualized orbits demonstrate no acute abnormality. Trace scattered paranasal sinus mucosal thickening. Tiny left ethmoid sinus osteoma. ASPECTS  ([place] Stroke Program Early CT Score) - Ganglionic level infarction (caudate, lentiform nuclei, internal capsule, insula, M1-M3 cortex): 6 (point adducted for density within the left insula) - Supraganglionic infarction (M4-M6 cortex): 3 Total score (0-10 with 10 being normal): 9 These results were called by telephone at the time of interpretation on 01/17/2020 at 12:04 pm to provider Dr. Wilford Corner, who verbally acknowledged these results. IMPRESSION: 1. Small focus of acute ischemic infarction changes within the posterior left insula. ASPECTS 9. 2. No evidence of acute intracranial hemorrhage. 3. Mild generalized parenchymal atrophy. Electronically Signed   By: Jackey Loge DO   On: 01/17/2020 12:05   VAS US CAROTID  Result Date: 01/18/2020 Carotid Arterial Duplex Study Indications:       CVA. Limitations        Today's exam was limited due to line in left neck. Comparison Study:  No prior study Performing Technologist: Gertie Fey MHA, RDMS, RVT, RDCS  Examination Guidelines: A complete evaluation includes B-mode imaging, spectral Doppler, color Doppler, and power Doppler as needed of all accessible portions of each vessel. Bilateral testing is considered an integral part of a complete examination. Limited examinations for reoccurring indications may be performed as noted.  Right Carotid Findings: +----------+--------+--------+--------+------------------+--------+           PSV cm/sEDV cm/sStenosisPlaque DescriptionComments +----------+--------+--------+--------+------------------+--------+ CCA Prox  68      17                                         +----------+--------+--------+--------+------------------+--------+ CCA Distal71      19                                         +----------+--------+--------+--------+------------------+--------+ ICA Prox  55      16              calcific                   +----------+--------+--------+--------+------------------+--------+ ICA  Distal74      31                                         +----------+--------+--------+--------+------------------+--------+ ECA       57  12                                         +----------+--------+--------+--------+------------------+--------+ +----------+--------+-------+----------------+-------------------+           PSV cm/sEDV cmsDescribe        Arm Pressure (mmHG) +----------+--------+-------+----------------+-------------------+ [id number]             Multiphasic, WNL                    +----------+--------+-------+----------------+-------------------+ +---------+--------+--------+--------------+ VertebralPSV cm/sEDV cm/sNot identified +---------+--------+--------+--------------+   Summary: Right Carotid: Velocities in the right ICA are consistent with a 1-39% stenosis. Left Carotid: Unable to evaluate left carotid arteries due to line location. Vertebrals:  Right vertebral artery was not visualized. Subclavians: Normal flow hemodynamics were seen in the right subclavian artery. *See table(s) above for measurements and observations.    Preliminary     Labs:  CBC: Recent Labs    01/17/20 1618 01/17/20 1618 01/18/20 0626 01/18/20 1418 01/19/20 0040 01/20/20 0410  WBC 14.5*  --  20.5*  --  13.2* 11.6*  HGB 12.2   < > 11.4* 9.5* 9.4* 8.5*  HCT 39.1   < > 36.4 28.0* 28.7* 26.8*  PLT 171  --  147*  --  132* 136*   < > = values in this interval not displayed.    COAGS: Recent Labs    01/17/20 1618 01/17/20 2053  INR 1.3* 1.3*  APTT >200* 46*    BMP: Recent Labs    01/17/20 1149 01/17/20 1149 01/17/20 1153 01/17/20 1153 01/18/20 0626 01/18/20 1418 01/19/20 0040 01/20/20 0410  NA 141   < > 140   < > 144 144 145 144  K 3.9   < > 3.9   < > 3.8 3.2* 3.4* 3.7  CL 107   < > 106  --  110  --  109 110  CO2 20*  --   --   --  21*  --  23 28  GLUCOSE 173*   < > 172*  --  130*  --  141* 176*  BUN 36*   < > 37*  --  30*  --  25* 27*  CALCIUM  9.4  --   --   --  8.3*  --  8.4* 8.4*  CREATININE 2.15*   < > 2.10*  --  1.70*  --  1.67* 1.34*  GFRNONAA 21*  --   --   --  28*  --  29* 37*  GFRAA 24*  --   --   --  32*  --  33* 43*   < > = values in this interval not displayed.    LIVER FUNCTION TESTS: Recent Labs    12/06/19 1059 01/17/20 1149 01/19/20 0040  BILITOT 0.3 0.8 0.6  AST 16 109* 80*  ALT 12 39 31  ALKPHOS  --  55 39  PROT 6.8 7.0 5.6*  ALBUMIN  --  4.0 2.9*    Assessment and Plan:  History of CVA s/p cerebral arteriogram with emergent IA tPA administration ingot proximal left MCA inferior division occlusion achieving a TICI 2b revascularization 01/17/2020 by Dr. Corliss Skains. Patient's condition stable- remains intubated without sedated, opens eyes to painful stimuli only, can spontaneously move left side and RLE, RUE withdraws from pain. Right groin incision stable, distal pulses 1+ bilaterally. Further plans per neurology/CCM- appreciate and  agree with management. Please call NIR with questions/concerns.   Electronically Signed: Elwin Mocha, PA-C 01/20/2020, 9:01 AM   I spent a total of 25 Minutes at the the patient's bedside AND on the patient's hospital floor or unit, greater than 50% of which was counseling/coordinating care for CVA s/p revascularization.

## 2020-01-20 NOTE — Social Work (Signed)
CSW was unable to complete sbirt due to pt being on the ventilator. CSW may attempt to complete at more appropriate time.   Kalese Ensz, LCSWA, LCASA Clinical Social Worker [phone]    

## 2020-01-20 NOTE — Procedures (Signed)
Patient Name: Sharon Henderson  MRN: [id number]  Epilepsy Attending: Charlsie Quest  Referring Physician/Provider: Annie Main, NP Date: 01/20/2020 Duration: 25.50mins  Patient history: 79 year old female with left MCA infarct.  EEG evaluate for seizures.  Level of alertness: lethargic  AEDs during EEG study: None  Technical aspects: This EEG study was done with scalp electrodes positioned according to the 10-20 International system of electrode placement. Electrical activity was acquired at a sampling rate of 500Hz  and reviewed with a high frequency filter of 70Hz  and a low frequency filter of 1Hz . EEG data were recorded continuously and digitally stored.   Description: EEG showed continuous generalized 3 to 5 Hz theta-delta slowing. Hyperventilation and photic stimulation were not performed.  EEG was technically difficult due to significant electrode artifact.  Abnormality -Continuous slow, generalized  IMPRESSION: This technically difficult study suggestive of moderate to severe diffuse encephalopathy, nonspecific to etiology.  No seizures or epileptiform discharges were seen throughout the recording.    Sharon Henderson 

## 2020-01-20 NOTE — Progress Notes (Signed)
NAME:  Sharon Henderson, MRN:  [id number], DOB:  Aug 06, 1941, LOS: 3 ADMISSION DATE:  01/17/2020, CONSULTATION DATE: 4/23 REFERRING MD:  Wayland Salinas, CHIEF COMPLAINT:  Respiratory failure    Brief History    79 year old female Who was found on the floor at her home by her toilet on 4/23 nonverbal by her daughter.  At baseline she was functional.  CT head showed new acute ischemic infarct involving the left side MCA.  She was outside of window for therapeutic systemic TPA and therefore went to interventional radiology for attempted revascularization -Additional initial NIH scale was 26 Underwent partial recannulation of the proximal left MCA, interventional radiology course notable for prolonged OR time Return to the intensive care on ventilator support critical care service asked to assist with ventilator management as well as blood pressure control  Past Medical History  Asthma, diabetes without complications, diverticulosis, hyperlipidemia, hypertension, internal hemorrhoids.  Significant Hospital Events   4/23 admitted: Intubated, went to interventional radiology and underwent attempt at revascularization  Consults:  Interventional neuroradiology Critical care  Procedures:  ETT 4/23>>>  Significant Diagnostic Tests:  Interventional radiology 4/23: 1.S/P partial recanalization of prox occlusion of inf division of Lt MCA following  Superselective infusion of 5 mg of IA TPA with TICI 2b revascularization. MRI 4/24 > . Fairly extensive cortex infarct in the left MCA territory, also with focal left caudate involvement. Questionable petechial hemorrhage in the posterior left temporal lobe, but no malignant hemorrhagic transformation. And no intracranial mass effect at this Time. 2. Motion degraded intracranial MRA is negative for large vessel occlusion. Superior left MCA flow signal compared to the right, which might be due to luxury perfusion.  Micro Data:    Antimicrobials:     Interim history/subjective:  Currently on SBT. Failed 4/25 due to tachypnea  Objective   Blood pressure 132/64, pulse 76, temperature 98.5 F (36.9 C), temperature source Axillary, resp. rate (!) 24, height 5\' 6"  (1.676 m), weight 87.6 kg, SpO2 100 %.    Vent Mode: PRVC FiO2 (%):  [40 %] 40 % Set Rate:  [24 bmp] 24 bmp Vt Set:  [470 mL] 470 mL PEEP:  [5 cmH20] 5 cmH20 Pressure Support:  [5 cmH20-10 cmH20] 5 cmH20 Plateau Pressure:  [11 cmH20-17 cmH20] 16 cmH20   Intake/Output Summary (Last 24 hours) at 01/20/2020 1055 Last data filed at 01/20/2020 0930 Gross per 24 hour  Intake 1157.5 ml  Output 380 ml  Net 777.5 ml   Filed Weights   01/17/20 2020 01/20/20 0455  Weight: 87.2 kg 87.6 kg    General Appearance:  Elderly female, no distress, on vent  HEENT: ETT/OG in place  Lungs: Clear breath sounds, no wheeze/crackles, some accessory muscle use   Heart: RRR, no MRG  Abdomen:  Soft, non-tender, active bowel sounds  Genitalia / Rectal: intact  Extremities:  -edema  Skin: intact Neurologic:  +gag/cough, awakens with verbal stimulation, does not follow commands, pupil left 2 mm reactive, right 5 mm non-reactive (chronic)   Resolved Hospital Problem list     Assessment & Plan:   Status post left MCA CVA with attempt at revascularization by interventional radiology -MRI with extensive cortex infarct in left MCA with focal left caudate involvement  Plan -Per Neurology  -Frequent Neuro Checks  -Okay for Permissive HTN for next 5-7 days  -Continue ASA, Plavix -Continue Zocor  Acute hypoxic respiratory failure w/ Ventilator dependence vent dependent s/p CVA Plan -Vent Support >> Neuro Status Barrier to Extubation >> Family discussing  time with one-way extubation   -VAP Bundle   Chronic renal insufficiency.  -baseline serum creatinine around 1.8 Plan -Trend BMP  -Replace electrolytes as indicated   Anemia critical illness Plan -Trend CBC  -Transfuse for  hemoglobin <7   DM Plan -Trend Glucose -SSI   Best practice:  Diet: NPO Pain/Anxiety/Delirium protocol (if indicated): 4/23 VAP protocol (if indicated): 4/23 DVT prophylaxis: scd GI prophylaxis: PPI Glucose control: ssi Mobility: BR Code Status: full code  Family Communication: per stroke team. daughters updated at bedside 4/26   Disposition: icu care   Hayden Pedro, AGACNP-BC [facility]  Pgr: [phone]  PCCM Pgr: [phone]

## 2020-01-20 NOTE — Progress Notes (Addendum)
STROKE TEAM PROGRESS NOTE   INTERVAL HISTORY Patient remains intubated and on respiratory failure requiring ventilatory support.  She remains aphasic and not following commands.  Patient daughter is at the bedside and I had a long conversation with her about her prognosis and family prefers supporting for the next few days and will decide about one-way extubation and DNR and let us know CT scan of the head this morning shows continued evolution of cytotoxic edema involving left frontoparietal intraborder regions but no significant mass-effect or midline shift.  Few scattered areas of petechial hemorrhage.  2D echo shows normal ejection fraction.  Carotid ultrasound is limited on the right and shows no stenosis.  LDL cholesterol 72 mg percent.  Hemoglobin A1c 7.2. OBJECTIVE Vitals:   01/20/20 0500 01/20/20 0600 01/20/20 0700 01/20/20 0800  BP: 106/64 113/65 111/69 132/64  Pulse: 78 80 76 76  Resp: (!) 24 (!) 24 (!) 24 (!) 24  Temp:    98.5 F (36.9 C)  TempSrc:    Axillary  SpO2: 98% 100% 100% 100%  Weight:      Height:        CBC:  Recent Labs  Lab 01/17/20 1618 01/17/20 1618 01/18/20 0626 01/18/20 1418 01/19/20 0040 01/20/20 0410  WBC 14.5*   < > 20.5*  --  13.2* 11.6*  NEUTROABS 13.0*  --  16.8*  --   --   --   HGB 12.2   < > 11.4*   < > 9.4* 8.5*  HCT 39.1   < > 36.4   < > 28.7* 26.8*  MCV 100.5*   < > 99.7  --  98.0 98.2  PLT 171   < > 147*  --  132* 136*   < > = values in this interval not displayed.    Basic Metabolic Panel:  Recent Labs  Lab 01/19/20 0040 01/20/20 0410  NA 145 144  K 3.4* 3.7  CL 109 110  CO2 23 28  GLUCOSE 141* 176*  BUN 25* 27*  CREATININE 1.67* 1.34*  CALCIUM 8.4* 8.4*  MG 2.0 2.2  PHOS 3.0 2.4*    Lipid Panel:     Component Value Date/Time   CHOL 136 01/18/2020 0626   CHOL 224 (H) 05/19/2015 1126   TRIG 61 01/18/2020 0626   HDL 52 01/18/2020 0626   HDL 68 05/19/2015 1126   CHOLHDL 2.6 01/18/2020 0626   VLDL 12 01/18/2020 0626    LDLCALC 72 01/18/2020 0626   LDLCALC 73 12/06/2019 1059   HgbA1c:  Lab Results  Component Value Date   HGBA1C 7.2 (H) 01/18/2020   IMAGING  CT HEAD WO CONTRAST 01/17/2020 IMPRESSION:  No hemorrhagic procedural complication. Developing low-density in the left insula, temporal lobe and parietal lobe consistent with developing left MCA infarction.   MRI MRA Head 01/18/2020 IMPRESSION: 1. Fairly extensive cortex infarct in the left MCA territory, also with focal left caudate involvement. Questionable petechial hemorrhage in the posterior left temporal lobe, but no malignant hemorrhagic transformation. And no intracranial mass effect at this time. 2. Motion degraded intracranial MRA is negative for large vessel occlusion. Superior left MCA flow signal compared to the right, which might be due to luxury perfusion.   CT HEAD CODE STROKE WO CONTRAST 01/17/2020 IMPRESSION:  1. Small focus of acute ischemic infarction changes within the posterior left insula. ASPECTS 9.  2. No evidence of acute intracranial hemorrhage.  3. Mild generalized parenchymal atrophy.   DG Chest Port 1 View 01/17/2020 IMPRESSION:  1. Endotracheal tube above the carina.  2. No acute cardiopulmonary process.   Interventional Neuro Radiology - Cerebral Angiogram with Intervention 01/17/2020 S/P partial recanalization of prox occlusion of inf division of Lt MCA following  Superselective infusion of 5 mg of IA TPA with TICI 2b revascularization.  Transthoracic Echocardiogram  Normal ejection fraction.  No cardiac source of embolism.  Bilateral Carotid Dopplers  No significant extracranial carotid stenosis on the right.  Left could not be evaluated due to central line  ECG - pending  PHYSICAL EXAM  Blood pressure 132/64, pulse 76, temperature 98.5 F (36.9 C), temperature source Axillary, resp. rate (!) 24, height 5\' 6"  (1.676 m), weight 87.6 kg, SpO2 100 %. Elderly African-American lady who is intubated not  in distress. . Afebrile. Head is nontraumatic. Neck is supple without bruit.    Cardiac exam no murmur or gallop. Lungs are clear to auscultation. Distal pulses are well felt. Neurological Exam : She is intubated and on ventilatory support.  Patient opens eyes today to stimulation, globally aphasic but may be following intermittent commands to wiggle toes, left eye deviation, right pupil non reactive(chronic) left is reactive and round,  blinks to threat on the left not the right, does not track examiner, does not cross midline, non verbal, right facial droop, withdrawing left > right on stimulation, slight withdrawal uppers to stim. coordination not cooperative. Gait untested.    ASSESSMENT/PLAN Ms. Sharon Henderson is a 79 y.o. female with history of HTN, HLD, and DM brought to [facility] ED after she was found on the floor by her toilet not responding to questions with a Rt facial droop. She did not receive IV t-PA due to late presentation (>4.5 hours from time of onset). IR - S/P partial recanalization of prox occlusion of inf division of Lt MCA following superselective infusion of 5 mg of IA TPA with TICI 2b revascularization.  Stroke: Left MCA infarct - likely embolic - source unknown.  Code Stroke CT Head - Small focus of acute ischemic infarction changes within the posterior left insula. ASPECTS 9.   CT head - Post IR - No hemorrhagic procedural complication. Developing low-density in the left insula, temporal lobe and parietal lobe consistent with developing left MCA infarction.   MRI head - Fairly extensive cortex infarct in the left MCA territory, also with focal left caudate involvement. Questionable petechial hemorrhage in the posterior left temporal lobe, but no malignant hemorrhagic transformation.   MRA head - Motion degraded intracranial MRA is negative for large vessel occlusion. Superior left MCA flow signal compared to the right, which might be due to luxury perfusion.  Carotid Doppler -  R ICA ok. Unable to see L ICA d/t line  2D Echo - EF 60-65%. No source of embolus   EEG pending  [employer] Virus 2 - negative  LDL - 72  HgbA1c - 7.2  UDS - not ordered  VTE prophylaxis - SCDs  No antithrombotic prior to admission, now on No antithrombotic -> now on ASA 81 mg daily and Plavix 75 mg daily  Patient will be counseled to be compliant with her antithrombotic medications  Per Dr Aroor - "Contrast staining versus small amount of subarachnoid on the Dyna CT after the procedure. Repeat head CT in 6 hours-no need to repeat head CT if the MRI is done in about 6 hours." ASA and Plavix" once bleed is ruled out.  Ongoing aggressive stroke risk factor management  Therapy recommendations:  CIR recommended (Rehab adm  co-ordinater following)  Disposition:  Pending    Acute Respiratory Failure  Intubated for IR, left intubated d/t airway protection  No sedation    Hypertension  Home BP meds: Coreg, Lasix, metoprolol and Cozaar  Current BP meds: None  Systolic blood pressure somewhat low  . Keep greater than 120 and less than 180 . Long-term BP goal normotensive   Hyperlipidemia  Home Lipid lowering medication: Zocor 10 mg daily  LDL 72, goal < 70  Current lipid lowering medication: increase Zocor to 20 mg daily  Continue statin at discharge  Diabetes  Home diabetic meds: metformin  Current diabetic meds: SSI  HgbA1c 7.2, goal < 7.0 Recent Labs    01/19/20 2350 01/20/20 0342 01/20/20 0728  GLUCAP 190* 159* 165*    Dysphagia . Secondary to stroke . NPO . Tube feeds per OG . Speech on board   Other Stroke Risk Factors  Advanced age  Obesity, Body mass index is 31.17 kg/m., recommend weight loss, diet and exercise as appropriate    Other Active Problems  Code status - DNR  CKD - stage 3b - creatinine - 2.15->2.10->1.70->1.67  Leukocytosis - 14.5->20.5->13.2 (temp 99.1) monitor  Hypokalemia - 3.2->3.4 (supplemented by  eLink)  Anemia - Hb - 12.2->11.4->9.5->9.4 (post procedure)  Thrombocytopenia - 171->147->132 monitor  Hypocalcemia - 8.3->8.4   Plan  I had a long discussion with the patient's daughter at the bedside regarding her prognosis and family feels she was independent lady handling her own affairs and not sure she would want prolonged ventilatory support and nursing home care.  Recommend continue ongoing care for the next few days but family needs to make a decision about one-way extubation and DNR versus trach PEG if necessary prior to extubation.  Discussed with critical care team.  . This patient is critically ill and at significant risk of neurological worsening, death and care requires constant monitoring of vital signs, hemodynamics,respiratory and cardiac monitoring, extensive review of multiple databases, frequent neurological assessment, discussion with family, other specialists and medical decision making of high complexity.I have made any additions or clarifications directly to the above note.This critical care time does not reflect procedure time, or teaching time or supervisory time of PA/NP/Med Resident etc but could involve care discussion time.  I spent 30 minutes of neurocritical care time  in the care of  this patient.      Hospital day # 3   Delia Heady, MD  To contact Stroke Continuity provider, please refer to [email or web address]. After hours, contact General Neurology

## 2020-01-20 NOTE — Progress Notes (Signed)
Initial Nutrition Assessment  DOCUMENTATION CODES:   Not applicable  INTERVENTION:   D/C Vital High Protein and Prostat  Vital AF 1.2 @ 60 ml/hr (1440 ml/day)  Provides: 1728 kcal, 108 grams protein, and 1167 ml free water.    NUTRITION DIAGNOSIS:   Inadequate oral intake related to inability to eat as evidenced by NPO status.  GOAL:   Patient will meet greater than or equal to 90% of their needs  MONITOR:   TF tolerance, I & O's  REASON FOR ASSESSMENT:   Consult, Ventilator Enteral/tube feeding initiation and management  ASSESSMENT:   Pt with PMH of asthma, DM, diverticulosis, HLD, HTN admitted after being found on the floor in her bathroom on 4/23 with L MCA stroke s/p IR for partial revascularization, noted prolonged OR time.   Pt discussed during ICU rounds and with RN.  Daughter at bedside and reports that pt has had no recent weight changes. Pt lives at an independent living center. States that the residents get together for Breakfast (now in small groups due to COVID) and have a catered lunch M-F. On the weekends family gives her food. During the week pt is able to prepare food for herself.   Patient is currently intubated on ventilator support MV: 11 L/min Temp (24hrs), Avg:98.7 F (37.1 C), Min:98.2 F (36.8 C), Max:99.2 F (37.3 C)   Medications reviewed and include: colace, SSI, miralax Labs reviewed: PO4: 2.4 (L) CBG's: 159-165 OG: tip in stomach per xray    TF: Vital High Protein @ 40 ml/hr with 30 ml Prostat BID Provides: 1160 kcal and 114 grams protein  NUTRITION - FOCUSED PHYSICAL EXAM:    Most Recent Value  Orbital Region  No depletion  Upper Arm Region  No depletion  Thoracic and Lumbar Region  No depletion  Buccal Region  No depletion  Temple Region  No depletion  Clavicle Bone Region  No depletion  Clavicle and Acromion Bone Region  No depletion  Scapular Bone Region  No depletion  Dorsal Hand  No depletion  Patellar Region  No  depletion  Anterior Thigh Region  No depletion  Posterior Calf Region  No depletion  Edema (RD Assessment)  Moderate  Hair  Reviewed  Eyes  Unable to assess  Mouth  Unable to assess  Skin  Reviewed  Nails  Reviewed       Diet Order:   Diet Order    None      EDUCATION NEEDS:   No education needs have been identified at this time  Skin:  Skin Assessment: Reviewed RN Assessment  Last BM:  unknown  Height:   Ht Readings from Last 1 Encounters:  01/17/20 5\' 6"  (1.676 m)    Weight:   Wt Readings from Last 1 Encounters:  01/20/20 87.6 kg    Ideal Body Weight:  59 kg  BMI:  Body mass index is 31.17 kg/m.  Estimated Nutritional Needs:   Kcal:  1704  Protein:  100-115 grams  Fluid:  > 1.7 L/day  01/22/20., RD, LDN, CNSC See AMiON for contact information

## 2020-01-21 ENCOUNTER — Inpatient Hospital Stay (HOSPITAL_COMMUNITY): Payer: Medicare HMO

## 2020-01-21 DIAGNOSIS — I639 Cerebral infarction, unspecified: Secondary | ICD-10-CM | POA: Diagnosis not present

## 2020-01-21 LAB — GLUCOSE, CAPILLARY
Glucose-Capillary: 210 mg/dL — ABNORMAL HIGH (ref 70–99)
Glucose-Capillary: 171 mg/dL — ABNORMAL HIGH (ref 70–99)
Glucose-Capillary: 191 mg/dL — ABNORMAL HIGH (ref 70–99)
Glucose-Capillary: 206 mg/dL — ABNORMAL HIGH (ref 70–99)
Glucose-Capillary: 162 mg/dL — ABNORMAL HIGH (ref 70–99)
Glucose-Capillary: 202 mg/dL — ABNORMAL HIGH (ref 70–99)

## 2020-01-21 LAB — BASIC METABOLIC PANEL
Anion gap: 8 (ref 5–15)
BUN: 31 mg/dL — ABNORMAL HIGH (ref 8–23)
CO2: 26 mmol/L (ref 22–32)
Calcium: 8.4 mg/dL — ABNORMAL LOW (ref 8.9–10.3)
Chloride: 109 mmol/L (ref 98–111)
Creatinine, Ser: 1.15 mg/dL — ABNORMAL HIGH (ref 0.44–1.00)
GFR calc Af Amer: 52 mL/min — ABNORMAL LOW (ref >60)
GFR calc non Af Amer: 45 mL/min — ABNORMAL LOW (ref >60)
Glucose, Bld: 208 mg/dL — ABNORMAL HIGH (ref 70–99)
Potassium: 4 mmol/L (ref 3.5–5.1)
Sodium: 143 mmol/L (ref 135–145)

## 2020-01-21 LAB — MAGNESIUM: Magnesium: 2.2 mg/dL (ref 1.7–2.4)

## 2020-01-21 LAB — PHOSPHORUS: Phosphorus: 2.5 mg/dL (ref 2.5–4.6)

## 2020-01-21 MED ORDER — INSULIN GLARGINE 100 UNIT/ML ~~LOC~~ SOLN
10.0000 [IU] | Freq: Every day | SUBCUTANEOUS | Status: DC
  Administered 2020-01-21 – 2020-01-22 (×2): 10 [IU] via SUBCUTANEOUS
  Filled 2020-01-21 (×2): qty 0.1

## 2020-01-21 MED ORDER — POLYETHYLENE GLYCOL 3350 17 G PO PACK: 17.0000 g | PACK | Freq: Every day | ORAL | Status: DC | PRN

## 2020-01-21 MED ORDER — ENOXAPARIN SODIUM 40 MG/0.4ML ~~LOC~~ SOLN
40.0000 mg | SUBCUTANEOUS | Status: DC
  Administered 2020-01-21 – 2020-01-22 (×2): 40 mg via SUBCUTANEOUS
  Filled 2020-01-21 (×2): qty 0.4

## 2020-01-21 MED ORDER — INSULIN ASPART 100 UNIT/ML ~~LOC~~ SOLN
0.0000 [IU] | SUBCUTANEOUS | Status: DC
  Administered 2020-01-21: 16:00:00 7 [IU] via SUBCUTANEOUS
  Administered 2020-01-21 – 2020-01-22 (×4): 4 [IU] via SUBCUTANEOUS
  Administered 2020-01-22: 3 [IU] via SUBCUTANEOUS
  Administered 2020-01-22: 14:00:00 4 [IU] via SUBCUTANEOUS

## 2020-01-21 MED ORDER — FENTANYL 2500MCG IN NS 250ML (10MCG/ML) PREMIX INFUSION: 0.0000 ug/h | INTRAVENOUS | Status: DC

## 2020-01-21 MED ORDER — BISACODYL 10 MG RE SUPP
10.0000 mg | Freq: Every day | RECTAL | Status: DC | PRN
  Filled 2020-01-21: qty 1

## 2020-01-21 NOTE — Progress Notes (Signed)
OT Cancellation Note  Patient Details Name: Sharon Henderson MRN: [id number] DOB: 03/19/1941   Cancelled Treatment:    Reason Eval/Treat Not Completed: Other (comment);Medical issues which prohibited therapy(Intubated and not following commands. RN reports possible plan for comfort today or tomorrow. Will return as schedule allows. Thank you.)  [name] [name] MSOT, OTR/L Acute Rehab Pager: [phone] Office: [phone] 01/21/2020, 10:12 AM

## 2020-01-21 NOTE — Progress Notes (Signed)
STROKE TEAM PROGRESS NOTE   INTERVAL HISTORY Patient son ,daughter and ex-husband are at the bedside.  Family clearly understands poor prognosis and are agreeable to withdrawal of care and comfort care measures only.  I had a long discussion with family and answered questions.  Her neurological condition remains unchanged.  She is barely responsive and does not follow any commands.  She remains on full ventilator support for respiratory failure.  Vital signs are stable.  OBJECTIVE Vitals:   01/21/20 0600 01/21/20 0700 01/21/20 0800 01/21/20 0801  BP: 119/75 118/77 123/72 123/72  Pulse: 75 87 73 77  Resp: (!) 24  (!) 24 (!) 24  Temp:   98.6 F (37 C)   TempSrc:   Axillary   SpO2: 100% 100% 100% 100%  Weight:      Height:        CBC:  Recent Labs  Lab 01/17/20 1618 01/17/20 1618 01/18/20 0626 01/18/20 1418 01/19/20 0040 01/20/20 0410  WBC 14.5*   < > 20.5*  --  13.2* 11.6*  NEUTROABS 13.0*  --  16.8*  --   --   --   HGB 12.2   < > 11.4*   < > 9.4* 8.5*  HCT 39.1   < > 36.4   < > 28.7* 26.8*  MCV 100.5*   < > 99.7  --  98.0 98.2  PLT 171   < > 147*  --  132* 136*   < > = values in this interval not displayed.    Basic Metabolic Panel:  Recent Labs  Lab 01/20/20 0410 01/21/20 0513  NA 144 143  K 3.7 4.0  CL 110 109  CO2 28 26  GLUCOSE 176* 208*  BUN 27* 31*  CREATININE 1.34* 1.15*  CALCIUM 8.4* 8.4*  MG 2.2 2.2  PHOS 2.4* 2.5    Lipid Panel:     Component Value Date/Time   CHOL 136 01/18/2020 0626   CHOL 224 (H) 05/19/2015 1126   TRIG 61 01/18/2020 0626   HDL 52 01/18/2020 0626   HDL 68 05/19/2015 1126   CHOLHDL 2.6 01/18/2020 0626   VLDL 12 01/18/2020 0626   LDLCALC 72 01/18/2020 0626   LDLCALC 73 12/06/2019 1059   HgbA1c:  Lab Results  Component Value Date   HGBA1C 7.2 (H) 01/18/2020   IMAGING past 24h DG Chest Port 1 View  Result Date: 01/21/2020 CLINICAL DATA:  79 year old female status post code stroke presentation with endovascular  revascularization of the left MCA. Remains intubated. EXAM: PORTABLE CHEST 1 VIEW COMPARISON:  Portable chest 01/20/2020 and earlier. FINDINGS: Portable AP semi upright view at 0536 hours. Stable endotracheal tube tip between the level the clavicles and carina. Enteric tube courses to the abdomen, tip not included. Stable lung volumes and mediastinal contours. No pneumothorax, pulmonary edema or pleural effusion. Linear platelike atelectasis at both lung bases is stable. Stable visualized osseous structures. Paucity of bowel gas in the upper abdomen. IMPRESSION: 1.  Stable lines and tubes. 2. Stable ventilation with mild bibasilar atelectasis. Electronically Signed   By: Genevie Ann M.D.   On: 01/21/2020 08:44   EEG adult  Result Date: 01/20/2020 Lora Havens, MD     01/20/2020  2:41 PM Patient Name: Sharon Henderson MRN: [id number] Epilepsy Attending: Lora Havens Referring Physician/Provider: Burnetta Sabin, NP Date: 01/20/2020 Duration: 25.33mins Patient history: 79 year old female with left MCA infarct.  EEG evaluate for seizures. Level of alertness: lethargic AEDs during EEG study: None Technical aspects:  This EEG study was done with scalp electrodes positioned according to the 10-20 International system of electrode placement. Electrical activity was acquired at a sampling rate of 500Hz  and reviewed with a high frequency filter of 70Hz  and a low frequency filter of 1Hz . EEG data were recorded continuously and digitally stored. Description: EEG showed continuous generalized 3 to 5 Hz theta-delta slowing. Hyperventilation and photic stimulation were not performed. EEG was technically difficult due to significant electrode artifact. Abnormality -Continuous slow, generalized IMPRESSION: This technically difficult study suggestive of moderate to severe diffuse encephalopathy, nonspecific to etiology.  No seizures or epileptiform discharges were seen throughout the recording. Priyanka    PHYSICAL EXAM     Elderly African-American lady who is intubated not in distress. . Afebrile. Head is nontraumatic. Neck is supple without bruit.    Cardiac exam no murmur or gallop. Lungs are clear to auscultation. Distal pulses are well felt. Neurological Exam : She is intubated and on ventilatory support.  Patient barely opens eyes to stimulation, globally aphasic but may be following intermittent commands to wiggle toes, left eye deviation, right pupil non reactive(chronic) left is reactive and round,  blinks to threat on the left not the right, does not track examiner, does not cross midline, non verbal, right facial droop, withdrawing left > right on stimulation, slight withdrawal uppers to stim. coordination not cooperative. Gait untested.    ASSESSMENT/PLAN Sharon Henderson is a 79 y.o. female with history of HTN, HLD, and DM brought to [facility] ED after she was found on the floor by her toilet not responding to questions with a Rt facial droop. She did not receive IV t-PA due to late presentation (>4.5 hours from time of onset). IR - S/P partial recanalization of prox occlusion of inf division of Lt MCA following superselective infusion of 5 mg of IA TPA with TICI 2b revascularization.  Stroke: Left MCA infarct - likely embolic - source unknown.  Code Stroke CT Head - Small focus of acute ischemic infarction changes within the posterior left insula. ASPECTS 9.   CT head - Post IR - No hemorrhagic procedural complication. Developing low-density in the left insula, temporal lobe and parietal lobe consistent with developing left MCA infarction.   MRI head - Fairly extensive cortex infarct in the left MCA territory, also with focal left caudate involvement. Questionable petechial hemorrhage in the posterior left temporal lobe, but no malignant hemorrhagic transformation.   MRA head - Motion degraded intracranial MRA is negative for large vessel occlusion. Superior left MCA flow signal compared to the right, which  might be due to luxury perfusion.  Carotid Doppler - R ICA ok. Unable to see L ICA d/t line  2D Echo - EF 60-65%. No source of embolus   EEG pending  Tereasa Coop Virus 2 - negative  LDL - 72  HgbA1c - 7.2  UDS - not ordered  VTE prophylaxis -  scds   No antithrombotic prior to admission, now on No antithrombotic -> now on ASA 81 mg daily and Plavix 75 mg daily  Patient will be counseled to be compliant with her antithrombotic medications  Per Dr Aroor - "Contrast staining versus small amount of subarachnoid on the Dyna CT after the procedure. Repeat head CT in 6 hours-no need to repeat head CT if the MRI is done in about 6 hours." ASA and Plavix" once bleed is ruled out.  Therapy recommendations:  CIR recommended (Rehab adm co-ordinater following)  Disposition:  Pending  Acute Respiratory Failure  Intubated for IR, left intubated d/t airway protection  No sedation  Unable to wean d/t tachypnea   CCM  If unable to wean, consider wean without reintubation vs trach. Dr. Pearlean Brownie discussed with daughter yesterday.   Hypertension  Home BP meds: Coreg, Lasix, metoprolol and Cozaar  Current BP meds: None  Systolic blood pressure somewhat low  . Keep greater than 120 and less than 180 . Long-term BP goal normotensive   Hyperlipidemia  Home Lipid lowering medication: Zocor 10 mg daily  LDL 72, goal < 70  Current lipid lowering medication: increase Zocor to 20 mg daily  Continue statin at discharge  Diabetes  Home diabetic meds: metformin  Current diabetic meds: SSI  HgbA1c 7.2, goal < 7.0 Recent Labs    01/20/20 2350 01/21/20 0345 01/21/20 0748  GLUCAP 169* 210* 171*    Dysphagia . Secondary to stroke . NPO . Tube feeds per OG . Speech on board   Other Stroke Risk Factors  Advanced age  Obesity, Body mass index is 33.34 kg/m., recommend weight loss, diet and exercise as appropriate    Other Active Problems  Code status - DNR  CKD -  stage 3b - creatinine - 1.15  Leukocytosis - 11.6  Hypokalemia - 4.0 (supplemented by eLink)  Anemia - Hb - 8.5 (post procedure)  Thrombocytopenia - 136  Hypocalcemia - 8.4     Patient's neurological exam remains quite poor and chances of survival and making any meaningful recovery are quite slim.  Patient's family clearly understands this and agree with withdrawal of care and comfort care measures only.  They await arrival of another family member later this morning.  Discussed with critical care team and answered questions.  This patient is critically ill and at significant risk of neurological worsening, death and care requires constant monitoring of vital signs, hemodynamics,respiratory and cardiac monitoring, extensive review of multiple databases, frequent neurological assessment, discussion with family, other specialists and medical decision making of high complexity.I have made any additions or clarifications directly to the above note.This critical care time does not reflect procedure time, or teaching time or supervisory time of PA/NP/Med Resident etc but could involve care discussion time.  I spent 30 minutes of neurocritical care time  in the care of  this patient.  Hospital day # 4  Delia Heady, MD  To contact Stroke Continuity provider, please refer to [email or web address]. After hours, contact General Neurology

## 2020-01-21 NOTE — Progress Notes (Signed)
CRITICAL VALUE ALERT  Critical Value:  CBC Clotted  Date & Time Notied:  0804 01/21/20  Provider Notified: NA  Orders Received/Actions taken: Reordered

## 2020-01-21 NOTE — Progress Notes (Addendum)
NAME:  Sharon Henderson, MRN:  [id number], DOB:  06/06/1941, LOS: 4 ADMISSION DATE:  01/17/2020, CONSULTATION DATE: 4/23 REFERRING MD:  Landis Gandy, CHIEF COMPLAINT:  Respiratory failure    Brief History    79 year old female Who was found on the floor at her home by her toilet on 4/23 nonverbal by her daughter.  At baseline she was functional.  CT head showed new acute ischemic infarct involving the left side MCA.  She was outside of window for therapeutic systemic TPA and therefore went to interventional radiology for attempted revascularization -Additional initial NIH scale was 26 Underwent partial recannulation of the proximal left MCA, interventional radiology course notable for prolonged OR time Return to the intensive care on ventilator support critical care service asked to assist with ventilator management as well as blood pressure control  Past Medical History  Asthma, diabetes without complications, diverticulosis, hyperlipidemia, hypertension, internal hemorrhoids.  Significant Hospital Events   4/23 admitted: Intubated, went to interventional radiology and underwent attempt at revascularization  Consults:  Interventional neuroradiology Critical care  Procedures:  ETT 4/23>>>  Significant Diagnostic Tests:  Interventional radiology 4/23: 1.S/P partial recanalization of prox occlusion of inf division of Lt MCA following  Superselective infusion of 5 mg of IA TPA with TICI 2b revascularization. MRI 4/24 > . Fairly extensive cortex infarct in the left MCA territory, also with focal left caudate involvement. Questionable petechial hemorrhage in the posterior left temporal lobe, but no malignant hemorrhagic transformation. And no intracranial mass effect at this Time. 2. Motion degraded intracranial MRA is negative for large vessel occlusion. Superior left MCA flow signal compared to the right, which might be due to luxury perfusion.  Micro Data:    Antimicrobials:    Interim  history/subjective:  Failed SBT 4/25,4/26 due to tachypnea. No further events overnight   Objective   Blood pressure 123/72, pulse 77, temperature 98.6 F (37 C), temperature source Axillary, resp. rate (!) 24, height 5\' 6"  (1.676 m), weight 93.7 kg, SpO2 100 %.    Vent Mode: PRVC FiO2 (%):  [40 %] 40 % Set Rate:  [24 bmp] 24 bmp Vt Set:  [470 mL] 470 mL PEEP:  [5 cmH20] 5 cmH20 Pressure Support:  [5 cmH20] 5 cmH20 Plateau Pressure:  [11 JAS50-53 cmH20] 18 cmH20   Intake/Output Summary (Last 24 hours) at 01/21/2020 0930 Last data filed at 01/21/2020 0800 Gross per 24 hour  Intake 1506.05 ml  Output 950 ml  Net 556.05 ml   Filed Weights   01/17/20 2020 01/20/20 0455 01/21/20 0407  Weight: 87.2 kg 87.6 kg 93.7 kg   General Appearance:  Elderly female, no distress, on vent  HEENT: ETT/OG in place  Lungs: Clear breath sounds, no wheeze/crackles Heart: RRR, no MRG  Abdomen:  Soft, non-tender, active bowel sounds  Genitalia / Rectal: intact  Extremities:  -edema  Skin: intact Neurologic:  +gag/cough, awakens with deep physical stimulation, does not follow commands, pupil left 2 mm reactive, right 5 mm non-reactive (chronic), RLE 2/5, RUE 1/5, LLE/LUE 3/5    Resolved Hospital Problem list     Assessment & Plan:   Status post left MCA CVA with attempt at revascularization by interventional radiology -MRI with extensive cortex infarct in left MCA with focal left caudate involvement  Plan -Per Neurology  -Frequent Neuro Checks  -Continue ASA, Plavix -Continue Zocor  Acute hypoxic respiratory failure w/ Ventilator dependence s/p CVA Plan -Vent Support >> Neuro Status Barrier to Extubation >> Family discussing time with one-way extubation >>  Idea is for 4/29 >> Currently undergoing SBT     -VAP Bundle   Chronic renal insufficiency.  -baseline serum creatinine around 1.8 Plan -Trend BMP  -Replace electrolytes as indicated   Anemia critical illness Plan -Trend CBC    -Transfuse for hemoglobin <7   DM Plan -Trend Glucose -SSI  -Add Lantus   Best practice:  Diet: NPO Pain/Anxiety/Delirium protocol (if indicated): 4/23 VAP protocol (if indicated): 4/23 DVT prophylaxis: scd GI prophylaxis: PPI Glucose control: ssi Mobility: BR Code Status: full code  Family Communication: per stroke team. daughters updated at bedside 4/26. Not at bedside 4/27.    Disposition: icu care   Jovita Kussmaul, AGACNP-BC [facility]  Pgr: [phone]  PCCM Pgr: [phone]

## 2020-01-21 NOTE — Progress Notes (Signed)
PT Cancellation Note  Patient Details Name: Sharon Henderson MRN: [id number] DOB: 06-27-1941   Cancelled Treatment:    Reason Eval/Treat Not Completed: Other (comment). Pt remains on vent with poor respiration status. Per RN family discussing transitioning to comfort care. Pt no appropriate for progression of mobility at this time. PT to return if appropriate as able.  Lewis Shock, PT, DPT Acute Rehabilitation Services Pager #: [phone] Office #: [phone]    Iona Hansen 01/21/2020, 10:15 AM

## 2020-01-21 NOTE — Plan of Care (Signed)
  Problem: Education: Goal: Knowledge of General Education information will improve Description: Including pain rating scale, medication(s)/side effects and non-pharmacologic comfort measures Outcome: Progressing   Problem: Clinical Measurements: Goal: Ability to maintain clinical measurements within normal limits will improve Outcome: Progressing Goal: Will remain free from infection Outcome: Progressing Goal: Diagnostic test results will improve Outcome: Progressing Goal: Respiratory complications will improve Outcome: Progressing Goal: Cardiovascular complication will be avoided Outcome: Progressing   Problem: Nutrition: Goal: Adequate nutrition will be maintained Outcome: Progressing   Problem: Coping: Goal: Level of anxiety will decrease Outcome: Progressing   Problem: Elimination: Goal: Will not experience complications related to bowel motility Outcome: Progressing Goal: Will not experience complications related to urinary retention Outcome: Progressing   Problem: Pain Managment: Goal: General experience of comfort will improve Outcome: Progressing   Problem: Safety: Goal: Ability to remain free from injury will improve Outcome: Progressing   Problem: Skin Integrity: Goal: Risk for impaired skin integrity will decrease Outcome: Progressing   Problem: Education: Goal: Knowledge of disease or condition will improve Outcome: Progressing Goal: Knowledge of secondary prevention will improve Outcome: Progressing Goal: Knowledge of patient specific risk factors addressed and post discharge goals established will improve Outcome: Progressing Goal: Individualized Educational Video(s) Outcome: Progressing   Problem: Coping: Goal: Will verbalize positive feelings about self Outcome: Progressing Goal: Will identify appropriate support needs Outcome: Progressing   Problem: Health Behavior/Discharge Planning: Goal: Ability to manage health-related needs will  improve Outcome: Progressing   Problem: Self-Care: Goal: Ability to participate in self-care as condition permits will improve Outcome: Progressing Goal: Verbalization of feelings and concerns over difficulty with self-care will improve Outcome: Progressing Goal: Ability to communicate needs accurately will improve Outcome: Progressing   Problem: Nutrition: Goal: Risk of aspiration will decrease Outcome: Progressing Goal: Dietary intake will improve Outcome: Progressing   Problem: Ischemic Stroke/TIA Tissue Perfusion: Goal: Complications of ischemic stroke/TIA will be minimized Outcome: Progressing

## 2020-01-21 NOTE — Progress Notes (Addendum)
SLP Cancellation Note  Patient Details Name: Sharon Henderson MRN: [id number] DOB: [birth year]   Cancelled treatment:       Reason Eval/Treat Not Completed: Medical issues which prohibited therapy (on vent). Per discussion with RN, will s/o. Please reorder if needed.    Mahala Menghini., M.A. CCC-SLP Acute Rehabilitation Services Pager [phone] Office [phone]  01/21/2020, 8:01 AM

## 2020-01-22 ENCOUNTER — Inpatient Hospital Stay (HOSPITAL_COMMUNITY): Payer: Medicare HMO

## 2020-01-22 DIAGNOSIS — I639 Cerebral infarction, unspecified: Secondary | ICD-10-CM | POA: Diagnosis not present

## 2020-01-22 LAB — GLUCOSE, CAPILLARY
Glucose-Capillary: 176 mg/dL — ABNORMAL HIGH (ref 70–99)
Glucose-Capillary: 129 mg/dL — ABNORMAL HIGH (ref 70–99)
Glucose-Capillary: 197 mg/dL — ABNORMAL HIGH (ref 70–99)

## 2020-01-22 LAB — MAGNESIUM: Magnesium: 2.3 mg/dL (ref 1.7–2.4)

## 2020-01-22 LAB — CBC
HCT: 31.1 % — ABNORMAL LOW (ref 36.0–46.0)
Hemoglobin: 9.5 g/dL — ABNORMAL LOW (ref 12.0–15.0)
MCH: 31 pg (ref 26.0–34.0)
MCHC: 30.5 g/dL (ref 30.0–36.0)
MCV: 101.6 fL — ABNORMAL HIGH (ref 80.0–100.0)
Platelets: 148 10*3/uL — ABNORMAL LOW (ref 150–400)
RBC: 3.06 MIL/uL — ABNORMAL LOW (ref 3.87–5.11)
RDW: 12.9 % (ref 11.5–15.5)
WBC: 14 10*3/uL — ABNORMAL HIGH (ref 4.0–10.5)
nRBC: 0.1 % (ref 0.0–0.2)

## 2020-01-22 LAB — BASIC METABOLIC PANEL
Anion gap: 12 (ref 5–15)
BUN: 30 mg/dL — ABNORMAL HIGH (ref 8–23)
CO2: 23 mmol/L (ref 22–32)
Calcium: 8.9 mg/dL (ref 8.9–10.3)
Chloride: 110 mmol/L (ref 98–111)
Creatinine, Ser: 1.16 mg/dL — ABNORMAL HIGH (ref 0.44–1.00)
GFR calc Af Amer: 51 mL/min — ABNORMAL LOW (ref >60)
GFR calc non Af Amer: 44 mL/min — ABNORMAL LOW (ref >60)
Glucose, Bld: 198 mg/dL — ABNORMAL HIGH (ref 70–99)
Potassium: 4.3 mmol/L (ref 3.5–5.1)
Sodium: 145 mmol/L (ref 135–145)

## 2020-01-22 LAB — PHOSPHORUS: Phosphorus: 2.3 mg/dL — ABNORMAL LOW (ref 2.5–4.6)

## 2020-01-22 MED ORDER — ALBUTEROL SULFATE (2.5 MG/3ML) 0.083% IN NEBU
2.5000 mg | INHALATION_SOLUTION | RESPIRATORY_TRACT | Status: DC | PRN
  Administered 2020-01-22: 2.5 mg via RESPIRATORY_TRACT
  Filled 2020-01-22: qty 3

## 2020-01-22 MED ORDER — GLYCOPYRROLATE 0.2 MG/ML IJ SOLN: 0.2000 mg | INTRAMUSCULAR | Status: DC | PRN

## 2020-01-22 MED ORDER — GLYCOPYRROLATE 0.2 MG/ML IJ SOLN
0.2000 mg | INTRAMUSCULAR | Status: DC | PRN
  Administered 2020-01-22 – 2020-01-23 (×2): 0.2 mg via INTRAVENOUS
  Filled 2020-01-22 (×3): qty 1

## 2020-01-22 MED ORDER — ONDANSETRON 4 MG PO TBDP: 4.0000 mg | ORAL_TABLET | Freq: Four times a day (QID) | ORAL | Status: DC | PRN

## 2020-01-22 MED ORDER — GLYCOPYRROLATE 1 MG PO TABS
1.0000 mg | ORAL_TABLET | ORAL | Status: DC | PRN
  Filled 2020-01-22: qty 1

## 2020-01-22 MED ORDER — MODAFINIL 100 MG PO TABS
100.0000 mg | ORAL_TABLET | Freq: Every day | ORAL | Status: DC
  Administered 2020-01-22: 100 mg
  Filled 2020-01-22: qty 1

## 2020-01-22 MED ORDER — DIPHENHYDRAMINE HCL 50 MG/ML IJ SOLN: 12.5000 mg | INTRAMUSCULAR | Status: DC | PRN

## 2020-01-22 MED ORDER — MORPHINE SULFATE (PF) 2 MG/ML IV SOLN
1.0000 mg | INTRAVENOUS | Status: DC | PRN
  Administered 2020-01-22 – 2020-01-23 (×3): 1 mg via INTRAVENOUS
  Filled 2020-01-22 (×3): qty 1

## 2020-01-22 MED ORDER — MIDAZOLAM HCL 2 MG/2ML IJ SOLN
2.0000 mg | INTRAMUSCULAR | Status: DC | PRN
  Administered 2020-01-22 (×2): 2 mg via INTRAVENOUS
  Filled 2020-01-22 (×3): qty 2

## 2020-01-22 MED ORDER — METHYLPREDNISOLONE SODIUM SUCC 125 MG IJ SOLR
125.0000 mg | Freq: Once | INTRAMUSCULAR | Status: AC
  Administered 2020-01-22: 125 mg via INTRAVENOUS

## 2020-01-22 MED ORDER — MIDAZOLAM HCL 2 MG/2ML IJ SOLN
2.0000 mg | INTRAMUSCULAR | Status: DC | PRN
  Administered 2020-01-22: 2 mg via INTRAVENOUS
  Filled 2020-01-22: qty 2

## 2020-01-22 MED ORDER — ONDANSETRON HCL 4 MG/2ML IJ SOLN: 4.0000 mg | Freq: Four times a day (QID) | INTRAMUSCULAR | Status: DC | PRN

## 2020-01-22 MED ORDER — RACEPINEPHRINE HCL 2.25 % IN NEBU: 0.5000 mL | INHALATION_SOLUTION | Freq: Once | RESPIRATORY_TRACT | Status: AC

## 2020-01-22 MED ORDER — RACEPINEPHRINE HCL 2.25 % IN NEBU
0.5000 mL | INHALATION_SOLUTION | Freq: Once | RESPIRATORY_TRACT | Status: AC
  Administered 2020-01-22: 0.5 mL via RESPIRATORY_TRACT

## 2020-01-22 MED ORDER — METOPROLOL TARTRATE 25 MG/10 ML ORAL SUSPENSION
25.0000 mg | Freq: Two times a day (BID) | ORAL | Status: DC
  Administered 2020-01-22: 25 mg
  Filled 2020-01-22: qty 10

## 2020-01-22 MED ORDER — POLYVINYL ALCOHOL 1.4 % OP SOLN
1.0000 [drp] | Freq: Four times a day (QID) | OPHTHALMIC | Status: DC | PRN
  Filled 2020-01-22: qty 15

## 2020-01-22 MED ORDER — MORPHINE 100MG IN NS 100ML (1MG/ML) PREMIX INFUSION
0.0000 mg/h | INTRAVENOUS | Status: DC
  Administered 2020-01-22 (×2): 1 mg/h via INTRAVENOUS
  Administered 2020-01-23 – 2020-01-24 (×4): 10 mg/h via INTRAVENOUS
  Filled 2020-01-22 (×5): qty 100

## 2020-01-22 MED ORDER — METHYLPREDNISOLONE SODIUM SUCC 125 MG IJ SOLR
INTRAMUSCULAR | Status: AC
  Filled 2020-01-22: qty 2

## 2020-01-22 NOTE — Progress Notes (Signed)
PT Cancellation Note/ Discharge from PT services  Patient Details Name: Sharon Henderson MRN: [id number] DOB: Apr 30, 1941   Cancelled Treatment:    Reason Eval/Treat Not Completed: Other (comment). Pt transitioning to comfort care. Pt no longer needing acute PT services. PT SIGNING OFF.  Lewis Shock, PT, DPT Acute Rehabilitation Services Pager #: [phone] Office #: [phone]    Iona Hansen 01/22/2020, 10:27 AM

## 2020-01-22 NOTE — Significant Event (Signed)
Family meeting with 4 daughters. Decision made to proceed with one-way extubation. If patient becomes distressed or decompensates will transition to comfort.

## 2020-01-22 NOTE — Progress Notes (Signed)
STROKE TEAM PROGRESS NOTE   INTERVAL HISTORY Patient `s ,daughter is at the bedside.  Family clearly understands poor prognosis and are agreeable to withdrawal of care and comfort care measures only.  Her neurological condition remains unchanged.  She is barely responsive and does not follow any commands.  She remains on full ventilator support for respiratory failure.  Vital signs are stable.  She is weaning slightly today and CCM feel it may be worth trying extubation to see if she can make it  OBJECTIVE Vitals:   01/22/20 1300 01/22/20 1359 01/22/20 1400 01/22/20 1405  BP: 116/87  134/80   Pulse:      Resp:      Temp:      TempSrc:      SpO2:  100%  100%  Weight:      Height:        CBC:  Recent Labs  Lab 01/17/20 1618 01/17/20 1618 01/18/20 0626 01/18/20 1418 01/20/20 0410 01/22/20 0522  WBC 14.5*   < > 20.5*   < > 11.6* 14.0*  NEUTROABS 13.0*  --  16.8*  --   --   --   HGB 12.2   < > 11.4*   < > 8.5* 9.5*  HCT 39.1   < > 36.4   < > 26.8* 31.1*  MCV 100.5*   < > 99.7   < > 98.2 101.6*  PLT 171   < > 147*   < > 136* 148*   < > = values in this interval not displayed.    Basic Metabolic Panel:  Recent Labs  Lab 01/21/20 0513 01/22/20 0522  NA 143 145  K 4.0 4.3  CL 109 110  CO2 26 23  GLUCOSE 208* 198*  BUN 31* 30*  CREATININE 1.15* 1.16*  CALCIUM 8.4* 8.9  MG 2.2 2.3  PHOS 2.5 2.3*    Lipid Panel:     Component Value Date/Time   CHOL 136 01/18/2020 0626   CHOL 224 (H) 05/19/2015 1126   TRIG 61 01/18/2020 0626   HDL 52 01/18/2020 0626   HDL 68 05/19/2015 1126   CHOLHDL 2.6 01/18/2020 0626   VLDL 12 01/18/2020 0626   LDLCALC 72 01/18/2020 0626   LDLCALC 73 12/06/2019 1059   HgbA1c:  Lab Results  Component Value Date   HGBA1C 7.2 (H) 01/18/2020   IMAGING past 24h DG CHEST PORT 1 VIEW  Result Date: 01/22/2020 CLINICAL DATA:  Acute respiratory failure. Endotracheally intubated. EXAM: PORTABLE CHEST 1 VIEW COMPARISON:  01/21/2020 FINDINGS:  Endotracheal tube and nasogastric tube remain in place. Stable linear opacity in left lung base may be due to subsegmental atelectasis or scarring. No evidence of pulmonary consolidation or edema. No evidence of pneumothorax or pleural effusion. Stable thoracic dextroscoliosis. IMPRESSION: Stable mild left basilar subsegmental atelectasis versus scarring. Electronically Signed   By: Danae Orleans M.D.   On: 01/22/2020 08:07    PHYSICAL EXAM    Elderly African-American lady who is intubated not in distress. . Afebrile. Head is nontraumatic. Neck is supple without bruit.    Cardiac exam no murmur or gallop. Lungs are clear to auscultation. Distal pulses are well felt. Neurological Exam : She is intubated and on ventilatory support.  Patient barely opens eyes to stimulation, globally aphasic but may be following intermittent commands to wiggle toes, left eye deviation, right pupil non reactive(chronic) left is reactive and round,  blinks to threat on the left not the right, does not track examiner, does  not cross midline, non verbal, right facial droop, withdrawing left > right on stimulation, slight withdrawal uppers to stim. coordination not cooperative. Gait untested.    ASSESSMENT/PLAN Sharon Henderson is a 79 y.o. female with history of HTN, HLD, and DM brought to [facility] ED after she was found on the floor by her toilet not responding to questions with a Rt facial droop. She did not receive IV t-PA due to late presentation (>4.5 hours from time of onset). IR - S/P partial recanalization of prox occlusion of inf division of Lt MCA following superselective infusion of 5 mg of IA TPA with TICI 2b revascularization.  Stroke: Left MCA infarct - likely embolic - source unknown.  Code Stroke CT Head - Small focus of acute ischemic infarction changes within the posterior left insula. ASPECTS 9.   CT head - Post IR - No hemorrhagic procedural complication. Developing low-density in the left insula, temporal  lobe and parietal lobe consistent with developing left MCA infarction.   MRI head - Fairly extensive cortex infarct in the left MCA territory, also with focal left caudate involvement. Questionable petechial hemorrhage in the posterior left temporal lobe, but no malignant hemorrhagic transformation.   MRA head - Motion degraded intracranial MRA is negative for large vessel occlusion. Superior left MCA flow signal compared to the right, which might be due to luxury perfusion.  Carotid Doppler - R ICA ok. Unable to see L ICA d/t line  2D Echo - EF 60-65%. No source of embolus   EEG pending  [employer] Virus 2 - negative  LDL - 72  HgbA1c - 7.2  UDS - not ordered  VTE prophylaxis -  scds   No antithrombotic prior to admission, now on No antithrombotic -> now on ASA 81 mg daily and Plavix 75 mg daily  Patient will be counseled to be compliant with her antithrombotic medications  Per Dr Aroor - "Contrast staining versus small amount of subarachnoid on the Dyna CT after the procedure. Repeat head CT in 6 hours-no need to repeat head CT if the MRI is done in about 6 hours." ASA and Plavix" once bleed is ruled out.  Therapy recommendations:  CIR recommended (Rehab adm co-ordinater following)  Disposition:  Pending   Acute Respiratory Failure  Intubated for IR, left intubated d/t airway protection  No sedation  Unable to wean d/t tachypnea   CCM  If unable to wean, consider wean without reintubation vs trach. Dr. Pearlean Brownie discussed with daughter yesterday.   Hypertension  Home BP meds: Coreg, Lasix, metoprolol and Cozaar  Current BP meds: None  Systolic blood pressure somewhat low  . Keep greater than 120 and less than 180 . Long-term BP goal normotensive   Hyperlipidemia  Home Lipid lowering medication: Zocor 10 mg daily  LDL 72, goal < 70  Current lipid lowering medication: increase Zocor to 20 mg daily  Continue statin at discharge  Diabetes  Home diabetic  meds: metformin  Current diabetic meds: SSI  HgbA1c 7.2, goal < 7.0 Recent Labs    01/22/20 0346 01/22/20 0813 01/22/20 1201  GLUCAP 176* 129* 197*    Dysphagia . Secondary to stroke . NPO . Tube feeds per OG . Speech on board   Other Stroke Risk Factors  Advanced age  Obesity, Body mass index is 32.88 kg/m., recommend weight loss, diet and exercise as appropriate    Other Active Problems  Code status - DNR  CKD - stage 3b - creatinine - 1.15  Leukocytosis - 11.6  Hypokalemia - 4.0 (supplemented by eLink)  Anemia - Hb - 8.5 (post procedure)  Thrombocytopenia - 136  Hypocalcemia - 8.4     Patient's neurological exam remains quite poor and chances of survival and making any meaningful recovery are quite slim.  Patient's family clearly understands this and agree with withdrawal of ventilatory support to see how she does but if she develops respiratory distress they agree with and comfort care measures only.  This would be done later today.  Discussed with critical care team and answered questions.  This patient is critically ill and at significant risk of neurological worsening, death and care requires constant monitoring of vital signs, hemodynamics,respiratory and cardiac monitoring, extensive review of multiple databases, frequent neurological assessment, discussion with family, other specialists and medical decision making of high complexity.I have made any additions or clarifications directly to the above note.This critical care time does not reflect procedure time, or teaching time or supervisory time of PA/NP/Med Resident etc but could involve care discussion time.  I spent 30 minutes of neurocritical care time  in the care of  this patient.  Hospital day # Powell, MD  To contact Stroke Continuity provider, please refer to [email or web address] After hours, contact General Neurology

## 2020-01-22 NOTE — Significant Event (Addendum)
After extubation patient with significant stridor. Given Racemic EPI and Solu-Medrol without effect. Family at bedside. Decision made to transition to comfort measures.   Comfort Orders Placed. Critical Care to sign off at this time.

## 2020-01-22 NOTE — Progress Notes (Signed)
NAME:  Sharon Henderson, MRN:  [id number], DOB:  [birth year], LOS: 5 ADMISSION DATE:  01/17/2020, CONSULTATION DATE: 4/23 REFERRING MD:  Landis Gandy, CHIEF COMPLAINT:  Respiratory failure    Brief History    79 year old female Who was found on the floor at her home by her toilet on 4/23 nonverbal by her daughter.  At baseline she was functional.  CT head showed new acute ischemic infarct involving the left side MCA.  She was outside of window for therapeutic systemic TPA and therefore went to interventional radiology for attempted revascularization -Additional initial NIH scale was 26 Underwent partial recannulation of the proximal left MCA, interventional radiology course notable for prolonged OR time Return to the intensive care on ventilator support critical care service asked to assist with ventilator management as well as blood pressure control  Past Medical History  Asthma, diabetes without complications, diverticulosis, hyperlipidemia, hypertension, internal hemorrhoids.  Significant Hospital Events   4/23 admitted: Intubated, went to interventional radiology and underwent attempt at revascularization  Consults:  Interventional neuroradiology Critical care  Procedures:  ETT 4/23>>>  Significant Diagnostic Tests:  Interventional radiology 4/23: 1.S/P partial recanalization of prox occlusion of inf division of Lt MCA following  Superselective infusion of 5 mg of IA TPA with TICI 2b revascularization. MRI 4/24 > . Fairly extensive cortex infarct in the left MCA territory, also with focal left caudate involvement. Questionable petechial hemorrhage in the posterior left temporal lobe, but no malignant hemorrhagic transformation. And no intracranial mass effect at this Time. 2. Motion degraded intracranial MRA is negative for large vessel occlusion. Superior left MCA flow signal compared to the right, which might be due to luxury perfusion.  Micro Data:    Antimicrobials:    Interim  history/subjective:  Currently undergoing SBT. No events overnight.   Objective   Blood pressure 138/81, pulse 93, temperature (!) 100.4 F (38 C), temperature source Axillary, resp. rate 15, height 5\' 6"  (1.676 m), weight 92.4 kg, SpO2 100 %.    Vent Mode: CPAP;PSV FiO2 (%):  [40 %] 40 % Set Rate:  [24 bmp] 24 bmp Vt Set:  [470 mL] 470 mL PEEP:  [5 cmH20] 5 cmH20 Pressure Support:  [5 cmH20-10 cmH20] 10 cmH20 Plateau Pressure:  [9 cmH20-18 cmH20] 9 cmH20   Intake/Output Summary (Last 24 hours) at 01/22/2020 0086 Last data filed at 01/22/2020 0600 Gross per 24 hour  Intake 1320 ml  Output 700 ml  Net 620 ml   Filed Weights   01/20/20 0455 01/21/20 0407 01/22/20 0326  Weight: 87.6 kg 93.7 kg 92.4 kg   General Appearance:  Elderly female, no distress, on vent  HEENT: ETT/OG in place  Lungs: Clear breath sounds, no wheeze/crackles Heart: RRR, no MRG  Abdomen:  Soft, non-tender, active bowel sounds  Genitalia / Rectal: intact  Extremities:  -edema  Skin: intact Neurologic: +gag/cough, opens eyes to physical stimulation, LUE/LLE 3/5, RUE no movement, RLE 1/5, does not follow commands    Resolved Hospital Problem list     Assessment & Plan:   Status post left MCA CVA with attempt at revascularization by interventional radiology -MRI with extensive cortex infarct in left MCA with focal left caudate involvement  Plan -Per Neurology  -Frequent Neuro Checks  -Continue ASA, Plavix -Continue Zocor  Acute hypoxic respiratory failure w/ Ventilator dependence s/p CVA Plan -Vent Support >> Neuro Status Barrier to Extubation >> Family discussing time with one-way extubation >> Plans for 4/29  -VAP Bundle   Chronic renal insufficiency.  -  baseline serum creatinine around 1.8 Plan -Trend BMP  -Replace electrolytes as indicated   Anemia critical illness Plan -Trend CBC  -Transfuse for hemoglobin <7   DM Plan -Trend Glucose -SSI  -Add Lantus   Best practice:  Diet: NPO  Pain/Anxiety/Delirium protocol (if indicated): 4/23 VAP protocol (if indicated): 4/23 DVT prophylaxis: scd GI prophylaxis: PPI Glucose control: ssi Mobility: BR Code Status: full code  Family Communication: daughter updated at bedside.  Disposition: icu care   Jovita Kussmaul, AGACNP-BC [facility]  Pgr: [phone]  PCCM Pgr: [phone]

## 2020-01-22 NOTE — Progress Notes (Signed)
Occupational Therapy Discharge Patient Details Name: Sharon Henderson MRN: [id number] DOB: 03-24-1941 Today's Date: 01/22/2020    Patient discharged from OT services secondary to medical decline - will need to re-order OT to resume therapy services.  Please see latest therapy progress note for current level of functioning and progress toward goals.    Progress and discharge plan discussed with patient and/or caregiver: spoke with RN and noted per chart pt transitioning to comfort care. Acute OT to sign off.  Marcy Siren, OT Acute Rehabilitation Services Pager [phone] Office [phone]   GO     Orlando Penner 01/22/2020, 1:58 PM

## 2020-01-22 NOTE — Procedures (Signed)
Extubation Procedure Note  Patient Details:   Name: Sharon Henderson DOB: 1941/08/06 MRN: [id number]   Airway Documentation:    Vent end date: 01/22/20 Vent end time: 1359   Evaluation  O2 sats: stable throughout Complications: Complications of stridor Patient did tolerate procedure well. Bilateral Breath Sounds: Stridor    RT extubated patient one-way per family wishes and MD order. RN and NP at bedside. Patient developed stridor after extubation and RT administered racemic epi per NP order. RT will continue to monitor as needed.   Lura Em 01/22/2020, 2:21 PM

## 2020-01-23 DIAGNOSIS — I639 Cerebral infarction, unspecified: Secondary | ICD-10-CM | POA: Diagnosis not present

## 2020-01-23 DIAGNOSIS — J9600 Acute respiratory failure, unspecified whether with hypoxia or hypercapnia: Secondary | ICD-10-CM | POA: Diagnosis not present

## 2020-01-23 NOTE — Progress Notes (Signed)
Patent examiner [facility]) Hospital Liaison: RN note   Notified by Andy Gauss of patient/family request for inpatient hospice at [facility] [place].    Writer spoke with family to confirm interest and explain services.   Liaison will follow up with family and TOC on 4/30 to confirm eligibly and availability.   Please call with any hospice related questions.   Thank you for this referral.   Elsie Saas, RN, CCM  [facility] Liaison (listed on AMION under Hospice/[facility])  [phone]

## 2020-01-23 NOTE — Progress Notes (Signed)
STROKE TEAM PROGRESS NOTE   INTERVAL HISTORY Patient `s ,daughter is at the bedside.  She has now been made full comfort care measures only.  She was transferred to hospice floor.  She is on morphine drip and resting comfortably.  Vital signs are stable. OBJECTIVE Vitals:   01/22/20 1400 01/22/20 1405 01/22/20 1500 01/23/20 0614  BP: 134/80  108/69 (!) 81/60  Pulse:    88  Resp:    (!) 4  Temp:    (!) 97.3 F (36.3 C)  TempSrc:    Axillary  SpO2:  100%  99%  Weight:      Height:        CBC:  Recent Labs  Lab 01/17/20 1618 01/17/20 1618 01/18/20 0626 01/18/20 1418 01/20/20 0410 01/22/20 0522  WBC 14.5*   < > 20.5*   < > 11.6* 14.0*  NEUTROABS 13.0*  --  16.8*  --   --   --   HGB 12.2   < > 11.4*   < > 8.5* 9.5*  HCT 39.1   < > 36.4   < > 26.8* 31.1*  MCV 100.5*   < > 99.7   < > 98.2 101.6*  PLT 171   < > 147*   < > 136* 148*   < > = values in this interval not displayed.    Basic Metabolic Panel:  Recent Labs  Lab 01/21/20 0513 01/22/20 0522  NA 143 145  K 4.0 4.3  CL 109 110  CO2 26 23  GLUCOSE 208* 198*  BUN 31* 30*  CREATININE 1.15* 1.16*  CALCIUM 8.4* 8.9  MG 2.2 2.3  PHOS 2.5 2.3*    Lipid Panel:     Component Value Date/Time   CHOL 136 01/18/2020 0626   CHOL 224 (H) 05/19/2015 1126   TRIG 61 01/18/2020 0626   HDL 52 01/18/2020 0626   HDL 68 05/19/2015 1126   CHOLHDL 2.6 01/18/2020 0626   VLDL 12 01/18/2020 0626   LDLCALC 72 01/18/2020 0626   LDLCALC 73 12/06/2019 1059   HgbA1c:  Lab Results  Component Value Date   HGBA1C 7.2 (H) 01/18/2020   IMAGING past 24h No results found.  PHYSICAL EXAM    Elderly African-American lady who is sedated on morphine drip not in distress. . Neurological Exam : She is sedated on morphine drip.  Patient barely opens eyes to stimulation, globally aphasic and not following any commands.  Toes, left eye deviation, right pupil non reactive(chronic) left is reactive and round,  blinks to threat on the left not  the right, does not track examiner, does not cross midline, non verbal, right facial droop, withdrawing left > right on stimulation, slight withdrawal uppers to stim. coordination not cooperative. Gait untested.    ASSESSMENT/PLAN Ms. Sharon Henderson is a 79 y.o. female with history of HTN, HLD, and DM brought to [facility] ED after she was found on the floor by her toilet not responding to questions with a Rt facial droop. She did not receive IV t-PA due to late presentation (>4.5 hours from time of onset). IR - S/P partial recanalization of prox occlusion of inf division of Lt MCA following superselective infusion of 5 mg of IA TPA with TICI 2b revascularization.  Stroke: Left MCA infarct - likely embolic - source unknown.  Code Stroke CT Head - Small focus of acute ischemic infarction changes within the posterior left insula. ASPECTS 9.   CT head - Post IR - No hemorrhagic  procedural complication. Developing low-density in the left insula, temporal lobe and parietal lobe consistent with developing left MCA infarction.   MRI head - Fairly extensive cortex infarct in the left MCA territory, also with focal left caudate involvement. Questionable petechial hemorrhage in the posterior left temporal lobe, but no malignant hemorrhagic transformation.   MRA head - Motion degraded intracranial MRA is negative for large vessel occlusion. Superior left MCA flow signal compared to the right, which might be due to luxury perfusion.  Carotid Doppler - R ICA ok. Unable to see L ICA d/t line  2D Echo - EF 60-65%. No source of embolus   EEG pending  [employer] Virus 2 - negative  LDL - 72  HgbA1c - 7.2  UDS - not ordered  VTE prophylaxis -  scds   No antithrombotic prior to admission, now on No antithrombotic -> now on ASA 81 mg daily and Plavix 75 mg daily  Patient will be counseled to be compliant with her antithrombotic medications  Per Dr Aroor - "Contrast staining versus small amount of  subarachnoid on the Dyna CT after the procedure. Repeat head CT in 6 hours-no need to repeat head CT if the MRI is done in about 6 hours." ASA and Plavix" once bleed is ruled out.  Therapy recommendations:  CIR recommended (Rehab adm co-ordinater following)  Disposition:  Pending   Acute Respiratory Failure  Intubated for IR, left intubated d/t airway protection  No sedation  Unable to wean d/t tachypnea   CCM  If unable to wean, consider wean without reintubation vs trach. Dr. Leonie Man discussed with daughter yesterday.   Hypertension  Home BP meds: Coreg, Lasix, metoprolol and Cozaar  Current BP meds: None  Systolic blood pressure somewhat low  . Keep greater than 120 and less than 180 . Long-term BP goal normotensive   Hyperlipidemia  Home Lipid lowering medication: Zocor 10 mg daily  LDL 72, goal < 70  Current lipid lowering medication: increase Zocor to 20 mg daily  Continue statin at discharge  Diabetes  Home diabetic meds: metformin  Current diabetic meds: SSI  HgbA1c 7.2, goal < 7.0 Recent Labs    01/22/20 0346 01/22/20 0813 01/22/20 1201  GLUCAP 176* 129* 197*    Dysphagia . Secondary to stroke . NPO . Tube feeds per OG . Speech on board   Other Stroke Risk Factors  Advanced age  Obesity, Body mass index is 32.88 kg/m., recommend weight loss, diet and exercise as appropriate    Other Active Problems  Code status - DNR  CKD - stage 3b - creatinine - 1.15  Leukocytosis - 11.6  Hypokalemia - 4.0 (supplemented by eLink)  Anemia - Hb - 8.5 (post procedure)  Thrombocytopenia - 136  Hypocalcemia - 8.4     Patient is now full comfort care measures only and resting peacefully on morphine drip.  Discussed with daughter at the bedside.  Family prefers moving to hospice nursing home in [place] as most of the family live in that area.  I will speak to the social worker to arrange this.  Hospital day # 6  Antony Contras, MD  To  contact Stroke Continuity provider, please refer to [email or web address] After hours, contact General Neurology

## 2020-01-23 NOTE — Social Work (Addendum)
10:21am- CSW approached by Dr. Pearlean Brownie with the request for referral to [facility] ([facility] hospice home) per family request. CSW has contacted Elsie Saas with this update. Await return response.   8:28am- CSW acknowledging pt arrival to floor for comfort care. Will follow for disposition should hospice services or residential hospice become appropriate.   Doy Hutching, MSW, LCSW [place] Clinical Social Work

## 2020-01-23 NOTE — Progress Notes (Signed)
Nutrition Brief Note  Chart reviewed. Pt now transitioning to comfort care.  No further nutrition interventions warranted at this time.  Please re-consult as needed.   Dawsyn Zurn W, RD, LDN, CDCES Registered Dietitian II Certified Diabetes Care and Education Specialist Please refer to AMION for RD and/or RD on-call/weekend/after hours pager  

## 2020-01-24 DIAGNOSIS — I639 Cerebral infarction, unspecified: Secondary | ICD-10-CM | POA: Diagnosis not present

## 2020-01-24 DIAGNOSIS — Z515 Encounter for palliative care: Secondary | ICD-10-CM

## 2020-01-24 MED ORDER — MORPHINE 100MG IN NS 100ML (1MG/ML) PREMIX INFUSION: 0.0000 mg/h | INTRAVENOUS | 0 refills | Status: AC

## 2020-01-24 MED ORDER — MIDAZOLAM HCL 2 MG/2ML IJ SOLN: 2.0000 mg | INTRAMUSCULAR | 0 refills | Status: DC | PRN

## 2020-01-24 MED ORDER — MORPHINE SULFATE (PF) 2 MG/ML IV SOLN: 1.0000 mg | INTRAVENOUS | 0 refills | Status: AC | PRN

## 2020-01-24 MED ORDER — GLYCOPYRROLATE 0.2 MG/ML IJ SOLN: 0.2000 mg | INTRAMUSCULAR | 3 refills | Status: DC | PRN

## 2020-01-24 MED ORDER — POLYVINYL ALCOHOL 1.4 % OP SOLN: 1.0000 [drp] | Freq: Four times a day (QID) | OPHTHALMIC | 0 refills | Status: DC | PRN

## 2020-01-24 NOTE — TOC Transition Note (Signed)
Transition of Care [facility]) - CM/SW Discharge Note   Patient Details  Name: Sharon Henderson MRN: [id number] Date of Birth: 1941-06-14  Transition of Care [facility]) CM/SW Contact:  Doy Hutching, LCSW Phone Number: Feb 15, 2020, 2:38 PM   Clinical Narrative:    Clinical Social Worker facilitated patient discharge including contacting patient family and facility to confirm patient discharge plans.  Clinical information faxed to facility and family agreeable with plan.  CSW arranged ambulance transport via [facility] to [facility] RN to call [phone]  with report prior to discharge.  Pt daughter Sharon Henderson aware. Clinical Social Worker will sign off for now as social work intervention is no longer needed. Please consult [place] again if new need arises   Final next level of care: Hospice Medical Facility Barriers to Discharge: Barriers Resolved   Patient Goals and CMS Choice Patient states their goals for this hospitalization and ongoing recovery are:: comfort care   Choice offered to / list presented to : Adult Children  Discharge Placement    Patient chooses bed at: Other - please specify in the comment section below:([facility]) Patient to be transferred to facility by: [facility] Name of family member notified: pt daughter Sharon Henderson Patient and family notified of of transfer: 2020/02/15   Readmission Risk Interventions Readmission Risk Prevention Plan 2020/02/15  Transportation Screening Complete  PCP or Specialist Appt within 5-7 Days Not Complete  Not Complete comments hospice home disposition  Home Care Screening Not Complete  Home Care Screening Not Completed Comments hospice home disposition  Medication Review (RN CM) Referral to Pharmacy  Some recent data might be hidden

## 2020-01-24 NOTE — Progress Notes (Signed)
Patient discharged to [facility] via [facility], daughter was at bedside. Patient transported via [facility].

## 2020-01-24 NOTE — Social Work (Addendum)
1:50pm- Pt summary complete, [facility] called, RN Aurea Graff aware.   1:28pm- CSW messaged Dr. Pearlean Brownie, await completed summary for transfer and update from [facility] still pending.   12:41pm- Pt has bed at [facility]. Await confirmation of signed consents to complete discharge. CSW had notified Neuro PA, await completed d/c summary and order.   Octavio Graves, MSW, LCSW [facility] Clinical Social Work

## 2020-01-24 NOTE — Discharge Summary (Addendum)
Stroke Discharge Summary  Patient ID: Sharon Henderson   MRN: [id number]      DOB: June 02, 1941  Date of Admission: 01/17/2020 Date of Discharge: 12/26/2019  Attending Physician:  Garvin Fila, MD, Stroke MD Consultant(s):    critical care Patient's PCP:  Olin Hauser, DO  Discharge Diagnoses:  Active Problems:   Acute ischemic stroke [facility]): Left MCA s/p failed attempt at revascularization   Middle cerebral artery embolism, left   Acute respiratory failure [facility])   Hospice care patient   LABORATORY STUDIES CBC    Component Value Date/Time   WBC 14.0 (H) 01/22/2020 0522   RBC 3.06 (L) 01/22/2020 0522   HGB 9.5 (L) 01/22/2020 0522   HGB 11.2 05/19/2015 1126   HCT 31.1 (L) 01/22/2020 0522   HCT 34.5 05/19/2015 1126   PLT 148 (L) 01/22/2020 0522   PLT 243 05/19/2015 1126   MCV 101.6 (H) 01/22/2020 0522   MCV 90 05/19/2015 1126   MCV 94 06/27/2014 0404   MCH 31.0 01/22/2020 0522   MCHC 30.5 01/22/2020 0522   RDW 12.9 01/22/2020 0522   RDW 13.9 05/19/2015 1126   RDW 14.1 06/27/2014 0404   LYMPHSABS 1.7 01/18/2020 0626   LYMPHSABS 1.9 05/19/2015 1126   LYMPHSABS 0.7 (L) 06/27/2014 0404   MONOABS 1.9 (H) 01/18/2020 0626   MONOABS 0.3 06/27/2014 0404   EOSABS 0.0 01/18/2020 0626   EOSABS 0.4 05/19/2015 1126   EOSABS 0.0 06/27/2014 0404   BASOSABS 0.0 01/18/2020 0626   BASOSABS 0.1 05/19/2015 1126   BASOSABS 0.0 06/27/2014 0404   CMP    Component Value Date/Time   NA 145 01/22/2020 0522   NA 143 02/29/2016 1138   NA 141 06/26/2014 0422   K 4.3 01/22/2020 0522   K 4.1 06/26/2014 0422   CL 110 01/22/2020 0522   CL 106 06/26/2014 0422   CO2 23 01/22/2020 0522   CO2 25 06/26/2014 0422   GLUCOSE 198 (H) 01/22/2020 0522   GLUCOSE 164 (H) 06/26/2014 0422   BUN 30 (H) 01/22/2020 0522   BUN 22 02/29/2016 1138   BUN 18 06/26/2014 0422   CREATININE 1.16 (H) 01/22/2020 0522   CREATININE 1.80 (H) 12/06/2019 1059   CALCIUM 8.9 01/22/2020 0522   CALCIUM 9.4  06/26/2014 0422   PROT 5.6 (L) 01/19/2020 0040   PROT 6.9 02/29/2016 1138   PROT 7.3 03/04/2014 1142   ALBUMIN 2.9 (L) 01/19/2020 0040   ALBUMIN 4.3 02/29/2016 1138   ALBUMIN 3.8 03/04/2014 1142   AST 80 (H) 01/19/2020 0040   AST 21 03/04/2014 1142   ALT 31 01/19/2020 0040   ALT 14 03/04/2014 1142   ALKPHOS 39 01/19/2020 0040   ALKPHOS 67 03/04/2014 1142   BILITOT 0.6 01/19/2020 0040   BILITOT 0.3 02/29/2016 1138   BILITOT 0.3 03/04/2014 1142   GFRNONAA 44 (L) 01/22/2020 0522   GFRNONAA 26 (L) 12/06/2019 1059   GFRAA 51 (L) 01/22/2020 0522   GFRAA 30 (L) 12/06/2019 1059   COAGS Lab Results  Component Value Date   INR 1.3 (H) 01/17/2020   INR 1.3 (H) 01/17/2020   Lipid Panel    Component Value Date/Time   CHOL 136 01/18/2020 0626   CHOL 224 (H) 05/19/2015 1126   TRIG 61 01/18/2020 0626   HDL 52 01/18/2020 0626   HDL 68 05/19/2015 1126   CHOLHDL 2.6 01/18/2020 0626   VLDL 12 01/18/2020 0626   LDLCALC 72 01/18/2020 6503  LDLCALC 73 12/06/2019 1059   HgbA1C  Lab Results  Component Value Date   HGBA1C 7.2 (H) 01/18/2020   Urinalysis    Component Value Date/Time   COLORURINE YELLOW (A) 01/04/2016 2115   APPEARANCEUR CLEAR (A) 01/04/2016 2115   APPEARANCEUR CLEAR 04/16/2013 1725   LABSPEC 1.017 01/04/2016 2115   LABSPEC 1.010 04/16/2013 1725   PHURINE 5.0 01/04/2016 2115   GLUCOSEU NEGATIVE 01/04/2016 2115   GLUCOSEU NEGATIVE 04/16/2013 1725   HGBUR 1+ (A) 01/04/2016 2115   BILIRUBINUR NEGATIVE 01/04/2016 2115   BILIRUBINUR NEGATIVE 04/16/2013 [place] 01/04/2016 2115   PROTEINUR NEGATIVE 01/04/2016 2115   NITRITE NEGATIVE 01/04/2016 2115   LEUKOCYTESUR 3+ (A) 01/04/2016 2115   LEUKOCYTESUR 3+ 04/16/2013 1725   Urine Drug Screen No results found for: LABOPIA, COCAINSCRNUR, LABBENZ, AMPHETMU, THCU, LABBARB  Alcohol Level No results found for: [facility]   SIGNIFICANT DIAGNOSTIC STUDIES CT HEAD WO CONTRAST  Result Date: 01/20/2020 CLINICAL  DATA:  Follow-up examination for acute stroke. EXAM: CT HEAD WITHOUT CONTRAST TECHNIQUE: Contiguous axial images were obtained from the base of the skull through the vertex without intravenous contrast. COMPARISON:  Comparison made with previous MRI from 01/18/20. FINDINGS: Brain: Increasing hypodensity consisting with evolving cytotoxic edema seen throughout the posterior left frontoparietal and temporal region, consistent with evolving left MCA territory infarct, increased from previous. Additionally, there are a few scattered subtle hyperdensities seen throughout the area of infarction, suggesting mild petechial hemorrhage. No frank parenchymal hematoma or hemorrhagic transformation. No significant regional mass effect or midline shift. Elsewhere remainder the brain is otherwise stable in appearance. No other new acute large vessel territory infarct or hemorrhage. No hydrocephalus or visible extra-axial fluid collection. Vascular: No hyperdense vessel. Skull: Scalp soft tissues and calvarium are within normal limits. Sinuses/Orbits: Globes and orbital soft tissues demonstrate no acute finding. Mild scattered mucosal thickening noted throughout the paranasal sinuses. Fluid seen within the nasopharynx. Patient is likely intubated. Mastoid air cells remain clear. Other: None. IMPRESSION: 1. Continued interval evolution of cytotoxic edema involving the posterior left frontoparietal and temporal regions, consistent with evolving left MCA territory infarct. No significant regional mass effect or midline shift. 2. Question a few scattered areas of supine imposed hyperdensity, suggesting petechial hemorrhage. No frank hemorrhagic transformation. Electronically Signed   By: Jeannine Boga M.D.   On: 01/20/2020 02:28   CT HEAD WO CONTRAST  Result Date: 01/17/2020 CLINICAL DATA:  Right facial droop.  Follow-up intervention. EXAM: CT HEAD WITHOUT CONTRAST TECHNIQUE: Contiguous axial images were obtained from the  base of the skull through the vertex without intravenous contrast. COMPARISON:  Head CT same day.  Interventional images. FINDINGS: Brain: Brainstem and cerebellum are normal. Right cerebral hemisphere shows mild chronic small-vessel change of the white matter. Left cerebral hemisphere shows increasing low-density in the left insula, temporal lobe and parietal lobe suggesting fairly sizable left MCA infarction. No sign of hemorrhage. No mass effect or shift at this time. No hydrocephalus. Vascular: There is atherosclerotic calcification of the major vessels at the base of the brain. Skull: Negative Sinuses/Orbits: Clear/normal Other: None IMPRESSION: No hemorrhagic procedural complication. Developing low-density in the left insula, temporal lobe and parietal lobe consistent with developing left MCA infarction. Electronically Signed   By: Nelson Chimes M.D.   On: 01/17/2020 18:08   MR ANGIO HEAD WO CONTRAST  Result Date: 01/18/2020 CLINICAL DATA:  79 year old female status post code stroke presentation, found down. Status post endovascular revascularization of the left MCA yesterday. EXAM:  MRI HEAD WITHOUT CONTRAST MRA HEAD WITHOUT CONTRAST TECHNIQUE: Multiplanar, multiecho pulse sequences of the brain and surrounding structures were obtained without intravenous contrast. Angiographic images of the head were obtained using MRA technique without contrast. COMPARISON:  Presentation head CT 1753 hours yesterday. FINDINGS: Study is intermittently degraded by motion artifact despite repeated imaging attempts. MRI HEAD FINDINGS Brain: There is a 10 cm area of gyriform restricted diffusion in the left MCA territory (series 5, image 79) relatively sparing the left superior frontal gyrus, but with left frontal, superior and lateral left temporal lobe involvement including the insula. Superimposed small area of involvement in the left caudate head. Cytotoxic edema with T2 and FLAIR hyperintensity. Questionable petechial  hemorrhage at the posterior temporal lobe on series 15, image 13, but no malignant hemorrhagic transformation. No significant mass effect at this time. No contralateral right hemisphere or posterior fossa restricted diffusion. No chronic encephalomalacia identified. No evidence of mass lesion, ventriculomegaly, extra-axial collection. Cervicomedullary junction and pituitary are within normal limits. Vascular: Major intracranial vascular flow voids are preserved. Skull and upper cervical spine: Negative for age visible cervical spine. Hyperostosis of the calvarium. Visualized bone marrow signal is within normal limits. Sinuses/Orbits: Postoperative changes to both globes. Trace fluid or mucosal thickening in the hyperplastic sphenoid sinus, stable. Mastoids remain well pneumatized. Other: Intubated.  Scalp and face soft tissues appear negative. MRA HEAD FINDINGS Antegrade flow in the posterior circulation with dominant appearing distal right vertebral artery. The left vertebral appears to functionally terminates in PICA. Motion artifact but no definite distal vertebral stenosis. Patent basilar artery which is diminutive, but without stenosis. Normal SCA origins. Fetal type PCA origins suspected, more so the left. Allowing for motion bilateral PCA branches are within normal limits. Antegrade flow in both ICA siphons. Allowing for motion no siphon stenosis is identified. Patent carotid termini, MCA and ACA origins. A1 segments appear codominant. The right MCA M1 and bifurcation appear patent, although diminutive. There is superior flow signal in the left MCA M1, bifurcation, and visible left MCA branches. Motion artifact degrades branch detail, but no left MCA branch occlusion is evident. IMPRESSION: 1. Fairly extensive cortex infarct in the left MCA territory, also with focal left caudate involvement. Questionable petechial hemorrhage in the posterior left temporal lobe, but no malignant hemorrhagic transformation.  And no intracranial mass effect at this time. 2. Motion degraded intracranial MRA is negative for large vessel occlusion. Superior left MCA flow signal compared to the right, which might be due to luxury perfusion. Electronically Signed   By: Genevie Ann M.D.   On: 01/18/2020 13:43   MR BRAIN WO CONTRAST  Result Date: 01/18/2020 CLINICAL DATA:  79 year old female status post code stroke presentation, found down. Status post endovascular revascularization of the left MCA yesterday. EXAM: MRI HEAD WITHOUT CONTRAST MRA HEAD WITHOUT CONTRAST TECHNIQUE: Multiplanar, multiecho pulse sequences of the brain and surrounding structures were obtained without intravenous contrast. Angiographic images of the head were obtained using MRA technique without contrast. COMPARISON:  Presentation head CT 1753 hours yesterday. FINDINGS: Study is intermittently degraded by motion artifact despite repeated imaging attempts. MRI HEAD FINDINGS Brain: There is a 10 cm area of gyriform restricted diffusion in the left MCA territory (series 5, image 79) relatively sparing the left superior frontal gyrus, but with left frontal, superior and lateral left temporal lobe involvement including the insula. Superimposed small area of involvement in the left caudate head. Cytotoxic edema with T2 and FLAIR hyperintensity. Questionable petechial hemorrhage at the posterior temporal lobe  on series 15, image 13, but no malignant hemorrhagic transformation. No significant mass effect at this time. No contralateral right hemisphere or posterior fossa restricted diffusion. No chronic encephalomalacia identified. No evidence of mass lesion, ventriculomegaly, extra-axial collection. Cervicomedullary junction and pituitary are within normal limits. Vascular: Major intracranial vascular flow voids are preserved. Skull and upper cervical spine: Negative for age visible cervical spine. Hyperostosis of the calvarium. Visualized bone marrow signal is within normal  limits. Sinuses/Orbits: Postoperative changes to both globes. Trace fluid or mucosal thickening in the hyperplastic sphenoid sinus, stable. Mastoids remain well pneumatized. Other: Intubated.  Scalp and face soft tissues appear negative. MRA HEAD FINDINGS Antegrade flow in the posterior circulation with dominant appearing distal right vertebral artery. The left vertebral appears to functionally terminates in PICA. Motion artifact but no definite distal vertebral stenosis. Patent basilar artery which is diminutive, but without stenosis. Normal SCA origins. Fetal type PCA origins suspected, more so the left. Allowing for motion bilateral PCA branches are within normal limits. Antegrade flow in both ICA siphons. Allowing for motion no siphon stenosis is identified. Patent carotid termini, MCA and ACA origins. A1 segments appear codominant. The right MCA M1 and bifurcation appear patent, although diminutive. There is superior flow signal in the left MCA M1, bifurcation, and visible left MCA branches. Motion artifact degrades branch detail, but no left MCA branch occlusion is evident. IMPRESSION: 1. Fairly extensive cortex infarct in the left MCA territory, also with focal left caudate involvement. Questionable petechial hemorrhage in the posterior left temporal lobe, but no malignant hemorrhagic transformation. And no intracranial mass effect at this time. 2. Motion degraded intracranial MRA is negative for large vessel occlusion. Superior left MCA flow signal compared to the right, which might be due to luxury perfusion. Electronically Signed   By: Genevie Ann M.D.   On: 01/18/2020 13:43   IR CT Head Ltd  Result Date: 01/21/2020 INDICATION: New onset aphasia and right-sided weakness. Occluded left middle cerebral artery inferior division on CT angiogram of the head and neck. EXAM: 1. EMERGENT LARGE VESSEL OCCLUSION THROMBOLYSIS (anterior CIRCULATION) COMPARISON:  CT angiogram of the head and neck of January 17, 2020.  MEDICATIONS: Ancef 2 g IV antibiotic was administered within 1 hour of the procedure. ANESTHESIA/SEDATION: General anesthesia. CONTRAST:  Isovue 300 approximately 100 mL. FLUOROSCOPY TIME:  Fluoroscopy Time: 60 minutes 42 seconds (3597 mGy). COMPLICATIONS: None immediate. TECHNIQUE: Following a full explanation of the procedure along with the potential associated complications, an informed witnessed consent was obtained from the patient's daughter. The risks of intracranial hemorrhage of 10%, worsening neurological deficit, ventilator dependency, death and inability to revascularize were all reviewed in detail with the patient's daughter. The patient was then put under general anesthesia by the Department of Anesthesiology at [facility]. The right groin was prepped and draped in the usual sterile fashion. Thereafter using modified Seldinger technique, transfemoral access into the right common femoral artery was obtained without difficulty. Over a 0.035 inch guidewire a 5 French Pinnacle sheath was inserted. Through this, and also over a 0.035 inch guidewire a 5 [place] JB 2 catheter was advanced to the aortic arch region and selectively positioned in the left common carotid artery. FINDINGS: The left common carotid arteriogram demonstrates significant tortuosity at the origin of the left common carotid artery and also the mid cervical segment. More distally the left external carotid artery and its major branches are widely patent. The left internal carotid artery at the bulb to the cranial skull base  demonstrates wide patency with moderate tortuosity proximally. The petrous, cavernous and the supraclinoid segments are widely patent. The left posterior communicating artery is seen opacifying the left posterior cerebral artery distribution. The left anterior cerebral artery opacifies into the capillary and venous phases. The left middle cerebral artery dominant superior division demonstrates wide patency into  the capillary and venous phases. Noted was angiographic occlusion of the inferior division of the left middle cerebral artery in the mid M2 segment. PROCEDURE: The 5 French diagnostic JB 2 catheter was exchanged over a 0.035 inch 300 cm Rosen exchange guidewire for an 8 French 25 cm Pinnacle sheath in the right groin, which was then connected to continuous heparinized saline infusion. A combination of a balloon 087, and a 6 [place] select Simmons 2 catheter was then advanced over the [employer] guidewire. The combination was advanced just proximal to the left common carotid bifurcation. The guidewire and the select [facility] 2 catheter were then retrieved and removed. Good aspiration obtained from the balloon guide catheter in the left common carotid artery at the bifurcation. A control arteriogram performed through the balloon guide catheter demonstrated wide patency extracranially and intracranially in the left ICA with no change in the occluded inferior division of the left middle cerebral artery M2 region. A combination of a 132 cm 6 [place] Catalyst guide catheter inside of which was an [employer] microcatheter was advanced over a 0.014 inch standard Synchro micro guidewire cephalad to the petrous cavernous segment. Further advancement of the micro guidewire was met with significant herniation of the platform into the abdominal aorta due to the severe tortuosity. An 014 inch Aristotle was then inserted through the microcatheter and advanced to the supraclinoid segment. Using a torque device the micro guidewire was then advanced without difficulty into the M2 M3 region of the inferior division followed by the microcatheter. However, the micro microcatheter could only be advanced to just proximal left MCA inferior division, despite deflation of the balloon guide catheter at the left common carotid bifurcation. Also advancement of the balloon guide was met with herniation into the aortic arch region. After  multiple attempts, it was elected to proceed with intra-arterial infusion of tPA. This was discussed as an option for revascularization with the neuro hospitalist. A CT of the brain was obtained to rule out the possibility of intracranial hemorrhage prior to infusion of IV tPA. CT demonstrated no evidence of intracranial hemorrhage or mass effect. Super selectively, using a 1 mL syringe, a total of 5 mg of super selective intra-arterial tPA was then infused with slow hand injection over approximately 8 minutes. Following the injection, a control arteriogram performed through the 6 [place] Catalyst guide catheter in the cavernous left internal carotid artery demonstrated improved caliber and flow through the inferior division into the M3 M4 regions. Given the angiographic findings, no further infusion of super selective intra-arterial tPA was undertaken. The balloon of the balloon guide catheter was then deflated, the 6 [place] Catalyst guide catheter, the microcatheter was retrieved and removed. A control arteriogram performed through the final control arteriogram performed through the 6 [place] guide catheter. The balloon guide in the left common carotid artery demonstrated no change in the extracranial or intracranial internal carotid artery. There continued to be patency of the left anterior cerebral artery, the left posterior communicating artery, and also the significantly improved partially recanalized inferior division. Throughout the procedure, the patient's blood pressure and neurologic status remained stable. Repeat CT of the brain demonstrated no evidence of  intracranial hemorrhage. There was noted a tubular hypo density in the distal MCA M4 region. There were no mass-effect. The balloon guide was removed. The 8 French Pinnacle sheath was then placed with the 8 French Angio-Seal closure device with hemostasis. Distal pulses remained Dopplerable in the dorsalis pedis, and the posterior tibial regions  bilaterally unchanged. The patient was left extubated on account of her neurologic condition, and being unable to maintain airway because of this. She was then transferred to the neuro ICU for post revascularization management. IMPRESSION: Status post endovascular significantly improved caliber and flow through the occluded inferior division of the left middle cerebral artery with a total of 5 mg of super selective intra arterial tPA achieving a TICI 2B plus revascularization. PLAN: Follow-up as per referring MD. Electronically Signed   By: Luanne Bras M.D.   On: 01/20/2020 09:12   IR CT Head Ltd  Result Date: 01/21/2020 INDICATION: New onset aphasia and right-sided weakness. Occluded left middle cerebral artery inferior division on CT angiogram of the head and neck. EXAM: 1. EMERGENT LARGE VESSEL OCCLUSION THROMBOLYSIS (anterior CIRCULATION) COMPARISON:  CT angiogram of the head and neck of January 17, 2020. MEDICATIONS: Ancef 2 g IV antibiotic was administered within 1 hour of the procedure. ANESTHESIA/SEDATION: General anesthesia. CONTRAST:  Isovue 300 approximately 100 mL. FLUOROSCOPY TIME:  Fluoroscopy Time: 60 minutes 42 seconds (3597 mGy). COMPLICATIONS: None immediate. TECHNIQUE: Following a full explanation of the procedure along with the potential associated complications, an informed witnessed consent was obtained from the patient's daughter. The risks of intracranial hemorrhage of 10%, worsening neurological deficit, ventilator dependency, death and inability to revascularize were all reviewed in detail with the patient's daughter. The patient was then put under general anesthesia by the Department of Anesthesiology at [facility]. The right groin was prepped and draped in the usual sterile fashion. Thereafter using modified Seldinger technique, transfemoral access into the right common femoral artery was obtained without difficulty. Over a 0.035 inch guidewire a 5 French Pinnacle sheath  was inserted. Through this, and also over a 0.035 inch guidewire a 5 [place] JB 2 catheter was advanced to the aortic arch region and selectively positioned in the left common carotid artery. FINDINGS: The left common carotid arteriogram demonstrates significant tortuosity at the origin of the left common carotid artery and also the mid cervical segment. More distally the left external carotid artery and its major branches are widely patent. The left internal carotid artery at the bulb to the cranial skull base demonstrates wide patency with moderate tortuosity proximally. The petrous, cavernous and the supraclinoid segments are widely patent. The left posterior communicating artery is seen opacifying the left posterior cerebral artery distribution. The left anterior cerebral artery opacifies into the capillary and venous phases. The left middle cerebral artery dominant superior division demonstrates wide patency into the capillary and venous phases. Noted was angiographic occlusion of the inferior division of the left middle cerebral artery in the mid M2 segment. PROCEDURE: The 5 French diagnostic JB 2 catheter was exchanged over a 0.035 inch 300 cm Rosen exchange guidewire for an 8 French 25 cm Pinnacle sheath in the right groin, which was then connected to continuous heparinized saline infusion. A combination of a balloon 087, and a 6 [place] select Simmons 2 catheter was then advanced over the [employer] guidewire. The combination was advanced just proximal to the left common carotid bifurcation. The guidewire and the select [facility] 2 catheter were then retrieved and removed. Good aspiration obtained from  the balloon guide catheter in the left common carotid artery at the bifurcation. A control arteriogram performed through the balloon guide catheter demonstrated wide patency extracranially and intracranially in the left ICA with no change in the occluded inferior division of the left middle cerebral artery M2  region. A combination of a 132 cm 6 [place] Catalyst guide catheter inside of which was an [employer] microcatheter was advanced over a 0.014 inch standard Synchro micro guidewire cephalad to the petrous cavernous segment. Further advancement of the micro guidewire was met with significant herniation of the platform into the abdominal aorta due to the severe tortuosity. An 014 inch Aristotle was then inserted through the microcatheter and advanced to the supraclinoid segment. Using a torque device the micro guidewire was then advanced without difficulty into the M2 M3 region of the inferior division followed by the microcatheter. However, the micro microcatheter could only be advanced to just proximal left MCA inferior division, despite deflation of the balloon guide catheter at the left common carotid bifurcation. Also advancement of the balloon guide was met with herniation into the aortic arch region. After multiple attempts, it was elected to proceed with intra-arterial infusion of tPA. This was discussed as an option for revascularization with the neuro hospitalist. A CT of the brain was obtained to rule out the possibility of intracranial hemorrhage prior to infusion of IV tPA. CT demonstrated no evidence of intracranial hemorrhage or mass effect. Super selectively, using a 1 mL syringe, a total of 5 mg of super selective intra-arterial tPA was then infused with slow hand injection over approximately 8 minutes. Following the injection, a control arteriogram performed through the 6 [place] Catalyst guide catheter in the cavernous left internal carotid artery demonstrated improved caliber and flow through the inferior division into the M3 M4 regions. Given the angiographic findings, no further infusion of super selective intra-arterial tPA was undertaken. The balloon of the balloon guide catheter was then deflated, the 6 [place] Catalyst guide catheter, the microcatheter was retrieved and removed. A control  arteriogram performed through the final control arteriogram performed through the 6 [place] guide catheter. The balloon guide in the left common carotid artery demonstrated no change in the extracranial or intracranial internal carotid artery. There continued to be patency of the left anterior cerebral artery, the left posterior communicating artery, and also the significantly improved partially recanalized inferior division. Throughout the procedure, the patient's blood pressure and neurologic status remained stable. Repeat CT of the brain demonstrated no evidence of intracranial hemorrhage. There was noted a tubular hypo density in the distal MCA M4 region. There were no mass-effect. The balloon guide was removed. The 8 French Pinnacle sheath was then placed with the 8 French Angio-Seal closure device with hemostasis. Distal pulses remained Dopplerable in the dorsalis pedis, and the posterior tibial regions bilaterally unchanged. The patient was left extubated on account of her neurologic condition, and being unable to maintain airway because of this. She was then transferred to the neuro ICU for post revascularization management. IMPRESSION: Status post endovascular significantly improved caliber and flow through the occluded inferior division of the left middle cerebral artery with a total of 5 mg of super selective intra arterial tPA achieving a TICI 2B plus revascularization. PLAN: Follow-up as per referring MD. Electronically Signed   By: Luanne Bras M.D.   On: 01/20/2020 09:12   DG CHEST PORT 1 VIEW  Result Date: 01/22/2020 CLINICAL DATA:  Acute respiratory failure. Endotracheally intubated. EXAM: PORTABLE CHEST 1 VIEW  COMPARISON:  01/21/2020 FINDINGS: Endotracheal tube and nasogastric tube remain in place. Stable linear opacity in left lung base may be due to subsegmental atelectasis or scarring. No evidence of pulmonary consolidation or edema. No evidence of pneumothorax or pleural effusion.  Stable thoracic dextroscoliosis. IMPRESSION: Stable mild left basilar subsegmental atelectasis versus scarring. Electronically Signed   By: Marlaine Hind M.D.   On: 01/22/2020 08:07   DG Chest Port 1 View  Result Date: 01/21/2020 CLINICAL DATA:  79 year old female status post code stroke presentation with endovascular revascularization of the left MCA. Remains intubated. EXAM: PORTABLE CHEST 1 VIEW COMPARISON:  Portable chest 01/20/2020 and earlier. FINDINGS: Portable AP semi upright view at 0536 hours. Stable endotracheal tube tip between the level the clavicles and carina. Enteric tube courses to the abdomen, tip not included. Stable lung volumes and mediastinal contours. No pneumothorax, pulmonary edema or pleural effusion. Linear platelike atelectasis at both lung bases is stable. Stable visualized osseous structures. Paucity of bowel gas in the upper abdomen. IMPRESSION: 1.  Stable lines and tubes. 2. Stable ventilation with mild bibasilar atelectasis. Electronically Signed   By: Genevie Ann M.D.   On: 01/21/2020 08:44   DG CHEST PORT 1 VIEW  Result Date: 01/20/2020 CLINICAL DATA:  Hypoxia EXAM: PORTABLE CHEST 1 VIEW COMPARISON:  January 19, 2020 FINDINGS: Endotracheal tube tip is 2.7 cm above the carina. Nasogastric tube tip and side port are below the diaphragm. No pneumothorax. There is bibasilar atelectasis, slightly more notable on the left than on the right. Lungs elsewhere are clear. Heart is upper normal in size with pulmonary vascularity normal. No adenopathy. There is lower thoracic dextroscoliosis. IMPRESSION: Tube positions as described without pneumothorax. Bibasilar atelectasis. Lungs elsewhere clear. Stable cardiac silhouette. Electronically Signed   By: Lowella Grip III M.D.   On: 01/20/2020 08:07   DG CHEST PORT 1 VIEW  Result Date: 01/19/2020 CLINICAL DATA:  Code stroke. EXAM: PORTABLE CHEST 1 VIEW COMPARISON:  January 18, 2020 FINDINGS: The heart size is borderline and stable. The  hila and mediastinum are unchanged. The ET tube is in good position. The NG tube terminates below today's film. Atelectasis in the left base. No pneumothorax. No other abnormalities. IMPRESSION: 1. Support apparatus as above. 2. Atelectasis in the left base. 3. No other changes. Electronically Signed   By: Dorise Bullion III M.D   On: 01/19/2020 11:55   DG Chest Port 1 View  Result Date: 01/18/2020 CLINICAL DATA:  Acute respiratory failure. EXAM: PORTABLE CHEST 1 VIEW COMPARISON:  January 17, 2020 FINDINGS: Scoliosis. Stable cardiomegaly. The hila and mediastinum are unchanged. The ETT is in good position. Mild bibasilar opacities and a possible small left effusion. No other interval changes or acute abnormalities. IMPRESSION: 1. Support apparatus as above. 2. Mild bibasilar opacities may represent atelectasis. There may be a small left effusion as well. Electronically Signed   By: Dorise Bullion III M.D   On: 01/18/2020 12:04   DG Chest Port 1 View  Result Date: 01/17/2020 CLINICAL DATA:  79 year old female with code stroke. EXAM: PORTABLE CHEST 1 VIEW COMPARISON:  Chest radiograph dated 06/25/2014. FINDINGS: An endotracheal tube with tip approximately 3 cm above the carina. Minimal left lung base atelectasis. No focal consolidation, pleural effusion, pneumothorax. There is mild cardiomegaly. No acute osseous pathology. IMPRESSION: 1. Endotracheal tube above the carina. 2. No acute cardiopulmonary process. Electronically Signed   By: Anner Crete M.D.   On: 01/17/2020 17:31   DG Abd Portable 1V  Result  Date: 01/18/2020 CLINICAL DATA:  79 year old female status post enteric tube placement. EXAM: PORTABLE ABDOMEN - 1 VIEW COMPARISON:  CT abdomen pelvis dated 01/19/2012. FINDINGS: Enteric tube is noted with tip likely in the distal stomach. Air is noted within the colon. IMPRESSION: Enteric tube with tip in the distal stomach. Electronically Signed   By: Anner Crete M.D.   On: 01/18/2020 17:25    EEG adult  Result Date: 01/20/2020 Lora Havens, MD     01/20/2020  2:41 PM Patient Name: Sharon Henderson MRN: [id number] Epilepsy Attending: Lora Havens Referring Physician/Provider: Burnetta Sabin, NP Date: 01/20/2020 Duration: 25.7mns Patient history: 79year old female with left MCA infarct.  EEG evaluate for seizures. Level of alertness: lethargic AEDs during EEG study: None Technical aspects: This EEG study was done with scalp electrodes positioned according to the 10-20 International system of electrode placement. Electrical activity was acquired at a sampling rate of '500Hz'  and reviewed with a high frequency filter of '70Hz'  and a low frequency filter of '1Hz' . EEG data were recorded continuously and digitally stored. Description: EEG showed continuous generalized 3 to 5 Hz theta-delta slowing. Hyperventilation and photic stimulation were not performed. EEG was technically difficult due to significant electrode artifact. Abnormality -Continuous slow, generalized IMPRESSION: This technically difficult study suggestive of moderate to severe diffuse encephalopathy, nonspecific to etiology.  No seizures or epileptiform discharges were seen throughout the recording. PLora Havens  ECHOCARDIOGRAM COMPLETE  Result Date: 01/19/2020    ECHOCARDIOGRAM REPORT   Patient Name:   Sharon Henderson Date of Exam: 01/19/2020 Medical Rec #:  [id number]     Height:       66.0 in Accession #:    [id number]    Weight:       192.2 lb Date of Birth:  [birth year]     BSA:          1.966 m Patient Age:    89years       BP:           131/74 mmHg Patient Gender: F              HR:           85 bpm. Exam Location:  Inpatient Procedure: 2D Echo and Intracardiac Opacification Agent Indications:    Stroke 434.91 / I163.9  History:        Patient has no prior history of Echocardiogram examinations.                 Risk Factors:Hypertension, Diabetes and Dyslipidemia. Asthma.  Sonographer:    TDarlina SicilianRDCS Referring Phys:  3HospersComments: Echo performed with patient supine and on artificial respirator. IMPRESSIONS  1. Left ventricular ejection fraction, by estimation, is 60 to 65%. The left ventricle has normal function. The left ventricle has no regional wall motion abnormalities. There is mild left ventricular hypertrophy of the basal-septal segment. Left ventricular diastolic parameters are consistent with Grade I diastolic dysfunction (impaired relaxation).  2. Right ventricular systolic function is normal. The right ventricular size is moderately enlarged.  3. The mitral valve is normal in structure. No evidence of mitral valve regurgitation. No evidence of mitral stenosis.  4. The aortic valve is normal in structure. Aortic valve regurgitation is not visualized. Mild to moderate aortic valve sclerosis/calcification is present, without any evidence of aortic stenosis.  5. The inferior vena cava is normal in size with greater than 50% respiratory  variability, suggesting right atrial pressure of 3 mmHg. Conclusion(s)/Recommendation(s): No intracardiac source of embolism detected on this transthoracic study. A transesophageal echocardiogram is recommended to exclude cardiac source of embolism if clinically indicated. FINDINGS  Left Ventricle: Left ventricular ejection fraction, by estimation, is 60 to 65%. The left ventricle has normal function. The left ventricle has no regional wall motion abnormalities. Definity contrast agent was given IV to delineate the left ventricular  endocardial borders. The left ventricular internal cavity size was normal in size. There is mild left ventricular hypertrophy of the basal-septal segment. Left ventricular diastolic parameters are consistent with Grade I diastolic dysfunction (impaired relaxation). Right Ventricle: The right ventricular size is moderately enlarged. No increase in right ventricular wall thickness. Right ventricular systolic function is normal. Left  Atrium: Left atrial size was normal in size. Right Atrium: Right atrial size was normal in size. Pericardium: A small pericardial effusion is present. The pericardial effusion is anterior to the right ventricle. The pericardial effusion appears to contain fibrous material. There is no evidence of cardiac tamponade. Mitral Valve: The mitral valve is normal in structure. Normal mobility of the mitral valve leaflets. No evidence of mitral valve regurgitation. No evidence of mitral valve stenosis. Tricuspid Valve: The tricuspid valve is normal in structure. Tricuspid valve regurgitation is not demonstrated. No evidence of tricuspid stenosis. Aortic Valve: The aortic valve is normal in structure. Aortic valve regurgitation is not visualized. Mild to moderate aortic valve sclerosis/calcification is present, without any evidence of aortic stenosis. Pulmonic Valve: The pulmonic valve was normal in structure. Pulmonic valve regurgitation is trivial. No evidence of pulmonic stenosis. Aorta: The aortic root is normal in size and structure. Venous: The inferior vena cava is normal in size with greater than 50% respiratory variability, suggesting right atrial pressure of 3 mmHg. IAS/Shunts: No atrial level shunt detected by color flow Doppler.  LEFT VENTRICLE PLAX 2D LVIDd:         4.40 cm  Diastology LVIDs:         3.00 cm  LV e' lateral:   7.72 cm/s LV PW:         0.80 cm  LV E/e' lateral: 5.3 LV IVS:        1.20 cm  LV e' medial:    4.90 cm/s LVOT diam:     2.10 cm  LV E/e' medial:  8.3 LVOT Area:     3.46 cm  RIGHT VENTRICLE             IVC RV S prime:     13.30 cm/s  IVC diam: 2.30 cm TAPSE (M-mode): 2.0 cm LEFT ATRIUM         Index LA diam:    3.40 cm 1.73 cm/m   AORTA Ao Root diam: 3.50 cm MITRAL VALVE MV Area (PHT): 5.97 cm    SHUNTS MV Decel Time: 127 msec    Systemic Diam: 2.10 cm MV E velocity: 40.90 cm/s MV A velocity: 36.00 cm/s MV E/A ratio:  1.14 MV A Prime:    7.9 cm/s Candee Furbish MD Electronically signed by  Candee Furbish MD Signature Date/Time: 01/19/2020/1:31:26 PM    Final    IR PERCUTANEOUS ART THROMBECTOMY/INFUSION INTRACRANIAL INC DIAG ANGIO  Result Date: 01/21/2020 INDICATION: New onset aphasia and right-sided weakness. Occluded left middle cerebral artery inferior division on CT angiogram of the head and neck. EXAM: 1. EMERGENT LARGE VESSEL OCCLUSION THROMBOLYSIS (anterior CIRCULATION) COMPARISON:  CT angiogram of the head and neck of January 17, 2020.  MEDICATIONS: Ancef 2 g IV antibiotic was administered within 1 hour of the procedure. ANESTHESIA/SEDATION: General anesthesia. CONTRAST:  Isovue 300 approximately 100 mL. FLUOROSCOPY TIME:  Fluoroscopy Time: 60 minutes 42 seconds (3597 mGy). COMPLICATIONS: None immediate. TECHNIQUE: Following a full explanation of the procedure along with the potential associated complications, an informed witnessed consent was obtained from the patient's daughter. The risks of intracranial hemorrhage of 10%, worsening neurological deficit, ventilator dependency, death and inability to revascularize were all reviewed in detail with the patient's daughter. The patient was then put under general anesthesia by the Department of Anesthesiology at [facility]. The right groin was prepped and draped in the usual sterile fashion. Thereafter using modified Seldinger technique, transfemoral access into the right common femoral artery was obtained without difficulty. Over a 0.035 inch guidewire a 5 French Pinnacle sheath was inserted. Through this, and also over a 0.035 inch guidewire a 5 [place] JB 2 catheter was advanced to the aortic arch region and selectively positioned in the left common carotid artery. FINDINGS: The left common carotid arteriogram demonstrates significant tortuosity at the origin of the left common carotid artery and also the mid cervical segment. More distally the left external carotid artery and its major branches are widely patent. The left internal carotid  artery at the bulb to the cranial skull base demonstrates wide patency with moderate tortuosity proximally. The petrous, cavernous and the supraclinoid segments are widely patent. The left posterior communicating artery is seen opacifying the left posterior cerebral artery distribution. The left anterior cerebral artery opacifies into the capillary and venous phases. The left middle cerebral artery dominant superior division demonstrates wide patency into the capillary and venous phases. Noted was angiographic occlusion of the inferior division of the left middle cerebral artery in the mid M2 segment. PROCEDURE: The 5 French diagnostic JB 2 catheter was exchanged over a 0.035 inch 300 cm Rosen exchange guidewire for an 8 French 25 cm Pinnacle sheath in the right groin, which was then connected to continuous heparinized saline infusion. A combination of a balloon 087, and a 6 [place] select Simmons 2 catheter was then advanced over the [employer] guidewire. The combination was advanced just proximal to the left common carotid bifurcation. The guidewire and the select [facility] 2 catheter were then retrieved and removed. Good aspiration obtained from the balloon guide catheter in the left common carotid artery at the bifurcation. A control arteriogram performed through the balloon guide catheter demonstrated wide patency extracranially and intracranially in the left ICA with no change in the occluded inferior division of the left middle cerebral artery M2 region. A combination of a 132 cm 6 [place] Catalyst guide catheter inside of which was an [employer] microcatheter was advanced over a 0.014 inch standard Synchro micro guidewire cephalad to the petrous cavernous segment. Further advancement of the micro guidewire was met with significant herniation of the platform into the abdominal aorta due to the severe tortuosity. An 014 inch Aristotle was then inserted through the microcatheter and advanced to the  supraclinoid segment. Using a torque device the micro guidewire was then advanced without difficulty into the M2 M3 region of the inferior division followed by the microcatheter. However, the micro microcatheter could only be advanced to just proximal left MCA inferior division, despite deflation of the balloon guide catheter at the left common carotid bifurcation. Also advancement of the balloon guide was met with herniation into the aortic arch region. After multiple attempts, it was elected to proceed with  intra-arterial infusion of tPA. This was discussed as an option for revascularization with the neuro hospitalist. A CT of the brain was obtained to rule out the possibility of intracranial hemorrhage prior to infusion of IV tPA. CT demonstrated no evidence of intracranial hemorrhage or mass effect. Super selectively, using a 1 mL syringe, a total of 5 mg of super selective intra-arterial tPA was then infused with slow hand injection over approximately 8 minutes. Following the injection, a control arteriogram performed through the 6 [place] Catalyst guide catheter in the cavernous left internal carotid artery demonstrated improved caliber and flow through the inferior division into the M3 M4 regions. Given the angiographic findings, no further infusion of super selective intra-arterial tPA was undertaken. The balloon of the balloon guide catheter was then deflated, the 6 [place] Catalyst guide catheter, the microcatheter was retrieved and removed. A control arteriogram performed through the final control arteriogram performed through the 6 [place] guide catheter. The balloon guide in the left common carotid artery demonstrated no change in the extracranial or intracranial internal carotid artery. There continued to be patency of the left anterior cerebral artery, the left posterior communicating artery, and also the significantly improved partially recanalized inferior division. Throughout the procedure, the  patient's blood pressure and neurologic status remained stable. Repeat CT of the brain demonstrated no evidence of intracranial hemorrhage. There was noted a tubular hypo density in the distal MCA M4 region. There were no mass-effect. The balloon guide was removed. The 8 French Pinnacle sheath was then placed with the 8 French Angio-Seal closure device with hemostasis. Distal pulses remained Dopplerable in the dorsalis pedis, and the posterior tibial regions bilaterally unchanged. The patient was left extubated on account of her neurologic condition, and being unable to maintain airway because of this. She was then transferred to the neuro ICU for post revascularization management. IMPRESSION: Status post endovascular significantly improved caliber and flow through the occluded inferior division of the left middle cerebral artery with a total of 5 mg of super selective intra arterial tPA achieving a TICI 2B plus revascularization. PLAN: Follow-up as per referring MD. Electronically Signed   By: Luanne Bras M.D.   On: 01/20/2020 09:12   CT HEAD CODE STROKE WO CONTRAST  Result Date: 01/17/2020 CLINICAL DATA:  Code stroke. Focal neuro deficit, greater than 6 hours, stroke suspected. Possible stroke. Right-sided facial droop. Last known well 2100. EXAM: CT HEAD WITHOUT CONTRAST TECHNIQUE: Contiguous axial images were obtained from the base of the skull through the vertex without intravenous contrast. COMPARISON:  No pertinent prior studies available for comparison. FINDINGS: Brain: There is no evidence of acute intracranial hemorrhage. There is subtle loss of gray-white differentiation along the posterior aspect of the left insula (series 3, image 13). No other definite loss of gray-white differentiation is identified. There is no evidence of intracranial mass. No midline shift or extra-axial fluid collection. Mild generalized parenchymal atrophy. Vascular: This vessel.  Atherosclerotic calcifications. Skull:  Normal. Negative for fracture or focal lesion. Sinuses/Orbits: Visualized orbits demonstrate no acute abnormality. Trace scattered paranasal sinus mucosal thickening. Tiny left ethmoid sinus osteoma. ASPECTS ([place] Stroke Program Early CT Score) - Ganglionic level infarction (caudate, lentiform nuclei, internal capsule, insula, M1-M3 cortex): 6 (point adducted for density within the left insula) - Supraganglionic infarction (M4-M6 cortex): 3 Total score (0-10 with 10 being normal): 9 These results were called by telephone at the time of interpretation on 01/17/2020 at 12:04 pm to provider Dr. Rory Percy, who verbally acknowledged these results. IMPRESSION: 1.  Small focus of acute ischemic infarction changes within the posterior left insula. ASPECTS 9. 2. No evidence of acute intracranial hemorrhage. 3. Mild generalized parenchymal atrophy. Electronically Signed   By: Kellie Simmering DO   On: 01/17/2020 12:05   VAS US CAROTID  Result Date: 01/20/2020 Carotid Arterial Duplex Study Indications:       CVA. Limitations        Today's exam was limited due to line in left neck. Comparison Study:  No prior study Performing Technologist: Maudry Mayhew MHA, RDMS, RVT, RDCS  Examination Guidelines: A complete evaluation includes B-mode imaging, spectral Doppler, color Doppler, and power Doppler as needed of all accessible portions of each vessel. Bilateral testing is considered an integral part of a complete examination. Limited examinations for reoccurring indications may be performed as noted.  Right Carotid Findings: +----------+--------+--------+--------+------------------+--------+           PSV cm/sEDV cm/sStenosisPlaque DescriptionComments +----------+--------+--------+--------+------------------+--------+ CCA Prox  68      17                                         +----------+--------+--------+--------+------------------+--------+ CCA Distal71      19                                          +----------+--------+--------+--------+------------------+--------+ ICA Prox  55      16              calcific                   +----------+--------+--------+--------+------------------+--------+ ICA Distal74      31                                         +----------+--------+--------+--------+------------------+--------+ ECA       57      12                                         +----------+--------+--------+--------+------------------+--------+ +----------+--------+-------+----------------+-------------------+           PSV cm/sEDV cmsDescribe        Arm Pressure (mmHG) +----------+--------+-------+----------------+-------------------+ [id number]             Multiphasic, WNL                    +----------+--------+-------+----------------+-------------------+ +---------+--------+--------+--------------+ VertebralPSV cm/sEDV cm/sNot identified +---------+--------+--------+--------------+   Summary: Right Carotid: Velocities in the right ICA are consistent with a 1-39% stenosis. Left Carotid: Unable to evaluate left carotid arteries due to line location. Vertebrals:  Right vertebral artery was not visualized. Subclavians: Normal flow hemodynamics were seen in the right subclavian artery. *See table(s) above for measurements and observations.  Electronically signed by Antony Contras MD on 01/20/2020 at 1:04:44 PM.   Final        HISTORY OF PRESENT ILLNESS 79 year old female with history of hypertension, hyperlipidemia, diabetes who was admitted after being found down at home due to underlying left MCA infarct.  On admission patient underwent thrombectomy TICI 2b revascularization, she was also intubated for acute respiratory failure however we were unable to wean patient  off mechanical ventilation.  Patient's family decided to move forward with comfort care measures, underwent 1 way extubation on 4/28, patient has now full comfort care and resting peacefully on morphine  drip.  HOSPITAL COURSE 1. Status post left MCA CVA with attempt at revascularization by interventional radiology -MRI with extensive cortex infarct in left MCA with focal left caudate involvement  2. Acute hypoxic respiratory failure w/ Ventilator dependence  - Comfort care measures only, underwent one way extubation on 4/28, currently comfortable on morphine drip   RN Pressure Injury Documentation:     Malnutrition Type:  Nutrition Problem: Inadequate oral intake Etiology: inability to eat   Malnutrition Characteristics:  Signs/Symptoms: NPO status   Nutrition Interventions:  Interventions: Tube feeding  DISCHARGE EXAM Blood pressure (!) 81/60, pulse 88, temperature (!) 97.3 F (36.3 C), temperature source Axillary, resp. rate (!) 4, height '5\' 6"'  (1.676 m), weight 92.4 kg, SpO2 99 %.  Discharge Diet      There are no active orders of the following types: Diet, Nourishments.   liquids  DISCHARGE PLAN Disposition: Comfort care/hospice  35 minutes were spent preparing discharge.  Posey Pronto PA-C [facility] [phone]  I have personally obtained history,examined this patient, reviewed notes, independently viewed imaging studies, participated in medical decision making and plan of care.ROS completed by me personally and pertinent positives fully documented  I have made any additions or clarifications directly to the above note. Agree with note above.    Antony Contras, MD Medical Director [facility] Pager: [phone] 12/31/2019 3:17 PM

## 2020-01-24 NOTE — Social Work (Addendum)
Clinical Social Worker facilitated patient discharge including contacting patient family and facility to confirm patient discharge plans.  Clinical information faxed to facility and family agreeable with plan.  CSW arranged ambulance transport via [facility] to [facility] RN to call [phone]  with report prior to discharge.  Pt daughter Ivor Messier aware. Clinical Social Worker will sign off for now as social work intervention is no longer needed. Please consult [place] again if new need arises.  Octavio Graves, MSW, LCSW Clinical Social Worker

## 2020-01-25 DEATH — deceased

## 2020-02-03 ENCOUNTER — Other Ambulatory Visit: Payer: Self-pay | Admitting: Family Medicine

## 2020-02-03 DIAGNOSIS — E1121 Type 2 diabetes mellitus with diabetic nephropathy: Secondary | ICD-10-CM

## 2020-02-03 DIAGNOSIS — I10 Essential (primary) hypertension: Secondary | ICD-10-CM

## 2020-02-19 ENCOUNTER — Telehealth: Payer: Self-pay

## 2020-06-12 ENCOUNTER — Ambulatory Visit: Payer: Medicare HMO | Admitting: Family Medicine

## 2020-11-18 IMAGING — MR MR HEAD W/O CM
9 of 11 series · 28 of 48 positions shown · non-contrast
Comparison: Presentation head CT 9363 hours yesterday.

CLINICAL DATA: 78-year-old female status post code stroke
presentation, found down. Status post endovascular revascularization
of the left MCA yesterday.

EXAM:
MRI HEAD WITHOUT CONTRAST
MRA HEAD WITHOUT CONTRAST
TECHNIQUE: Multiplanar, multiecho pulse sequences of the brain and surrounding
structures were obtained without intravenous contrast. Angiographic
images of the head were obtained using MRA technique without
contrast.

[Series 5: DWI · axial · 3.0mm · 0.96mm/px · z∈[-140,+10]mm · 6 of 104 slices shown (1 of 4)]
[im 1/104]
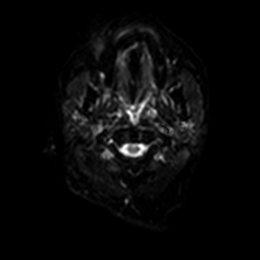
[im 21/104]
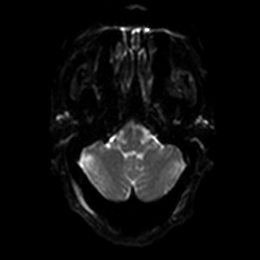
[im 42/104]
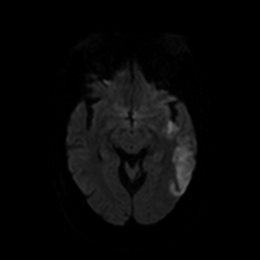
[im 62/104]
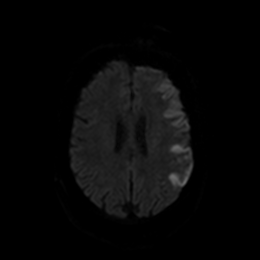
[im 83/104]
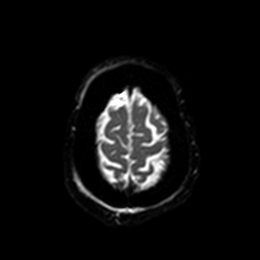
[im 104/104]
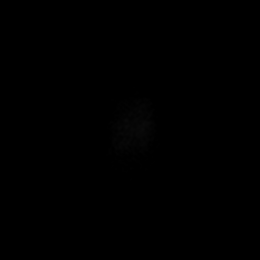

[Series 6: DWI · axial · 3.0mm · 0.96mm/px · z∈[-140,+10]mm · 4 of 52 slices shown (2 of 4)]
[im 1/52]
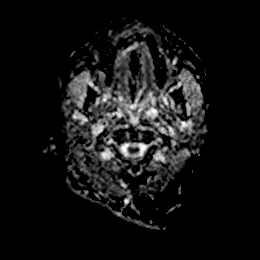
[im 18/52]
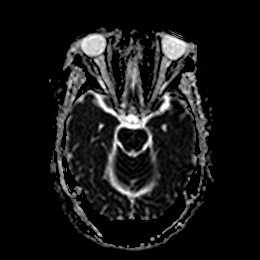
[im 35/52]
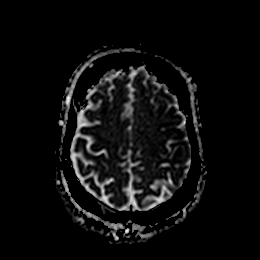
[im 52/52]
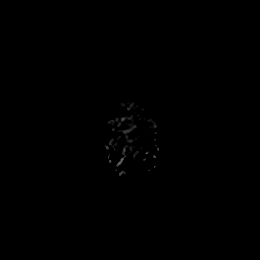

[Series 7: DWI · coronal · 4.0mm · 0.88mm/px · 5 of 72 slices shown (3 of 4)]
[im 1/72]
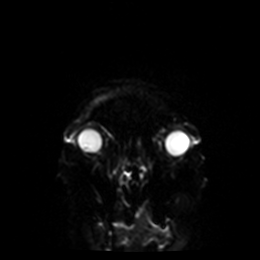
[im 18/72]
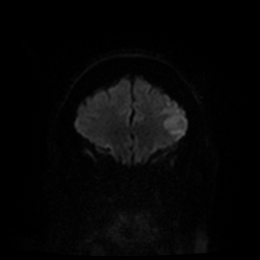
[im 36/72]
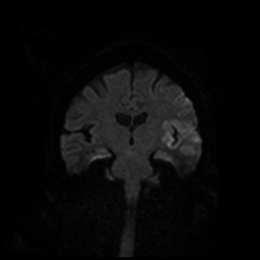
[im 54/72]
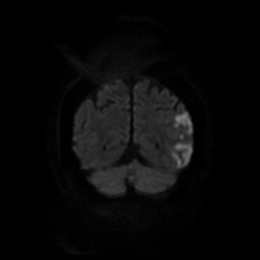
[im 72/72]
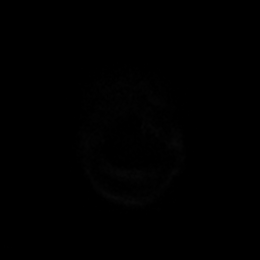

[Series 8: DWI · coronal · 4.0mm · 0.88mm/px · 3 of 36 slices shown (4 of 4)]
[im 1/36]
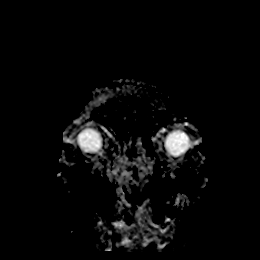
[im 18/36]
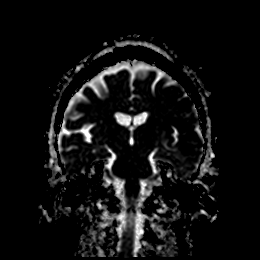
[im 36/36]
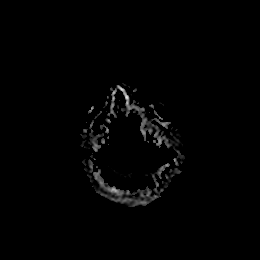

[Series 14: FLAIR · axial · 5.0mm · 0.90mm/px · z∈[-139,+2]mm · 2 of 25 slices shown]
[im 1/25]
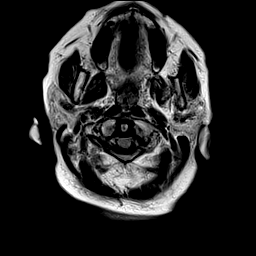
[im 25/25]
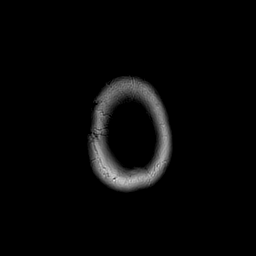

[Series 15: ax hemo · axial · 5.0mm · 0.90mm/px · z∈[-133,+7]mm · 2 of 25 slices shown]
[im 1/25]
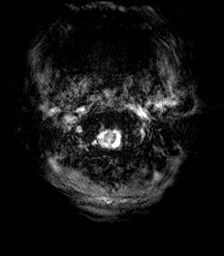
[im 25/25]
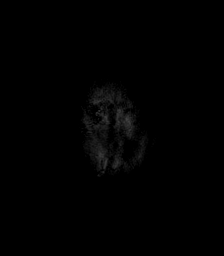

[Series 16: T1 · sagittal · 5.0mm · 0.94mm/px · 2 of 25 slices shown]
[im 1/25]
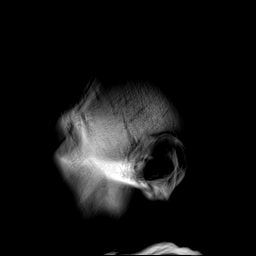
[im 25/25]
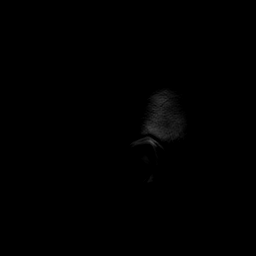

[Series 17: T2 · axial · 5.0mm · 0.72mm/px · z∈[-139,+2]mm · 2 of 25 slices shown (1 of 2)]
[im 1/25]
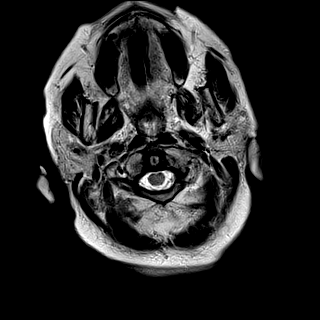
[im 25/25]
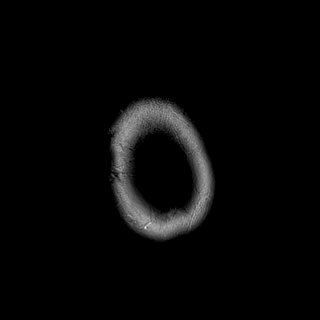

[Series 19: T2 · coronal · 5.0mm · 0.72mm/px · 2 of 28 slices shown (2 of 2)]
[im 1/28]
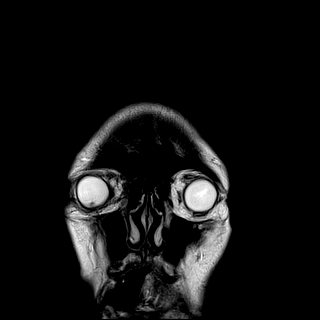
[im 28/28]
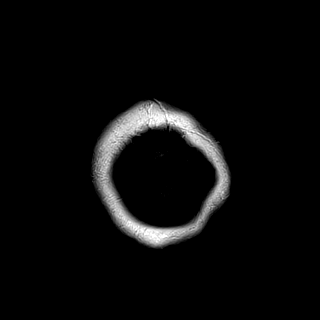

[28 of 48 positions shown; findings below may reference images not displayed]

FINDINGS: Study is intermittently degraded by motion artifact despite repeated
imaging attempts.

MRI HEAD FINDINGS

Brain: There is a 10 cm area of gyriform restricted diffusion in the
left MCA territory (series 5, image 79) relatively sparing the left
superior frontal gyrus, but with left frontal, superior and lateral
left temporal lobe involvement including the insula. Superimposed
small area of involvement in the left caudate head. Cytotoxic edema
with T2 and FLAIR hyperintensity. Questionable petechial hemorrhage
at the posterior temporal lobe on series 15, image 13, but no
malignant hemorrhagic transformation. No significant mass effect at
this time.

No contralateral right hemisphere or posterior fossa restricted
diffusion. No chronic encephalomalacia identified. No evidence of
mass lesion, ventriculomegaly, extra-axial collection.
Cervicomedullary junction and pituitary are within normal limits.

Vascular: Major intracranial vascular flow voids are preserved.

Skull and upper cervical spine: Negative for age visible cervical
spine. Hyperostosis of the calvarium. Visualized bone marrow signal
is within normal limits.

Sinuses/Orbits: Postoperative changes to both globes. Trace fluid or
mucosal thickening in the hyperplastic sphenoid sinus, stable.
Mastoids remain well pneumatized.

Other: Intubated.  Scalp and face soft tissues appear negative.

MRA HEAD FINDINGS

Antegrade flow in the posterior circulation with dominant appearing
distal right vertebral artery. The left vertebral appears to
functionally terminates in PICA. Motion artifact but no definite
distal vertebral stenosis. Patent basilar artery which is
diminutive, but without stenosis. Normal SCA origins. Fetal type PCA
origins suspected, more so the left. Allowing for motion bilateral
PCA branches are within normal limits.

Antegrade flow in both ICA siphons. Allowing for motion no siphon
stenosis is identified. Patent carotid termini, MCA and ACA origins.
A1 segments appear codominant. The right MCA M1 and bifurcation
appear patent, although diminutive.

There is superior flow signal in the left MCA M1, bifurcation, and
visible left MCA branches. Motion artifact degrades branch detail,
but no left MCA branch occlusion is evident.
IMPRESSION: 1. Fairly extensive cortex infarct in the left MCA territory, also
with focal left caudate involvement. Questionable petechial
hemorrhage in the posterior left temporal lobe, but no malignant
hemorrhagic transformation. And no intracranial mass effect at this
time.

2. Motion degraded intracranial MRA is negative for large vessel
occlusion. Superior left MCA flow signal compared to the right,
which might be due to luxury perfusion.

## 2020-11-19 IMAGING — DX DG CHEST 1V PORT
1 series · 1 of 1 positions shown · non-contrast
Comparison: January 18, 2020

CLINICAL DATA: Code stroke.

EXAM:
PORTABLE CHEST 1 VIEW

[chest]
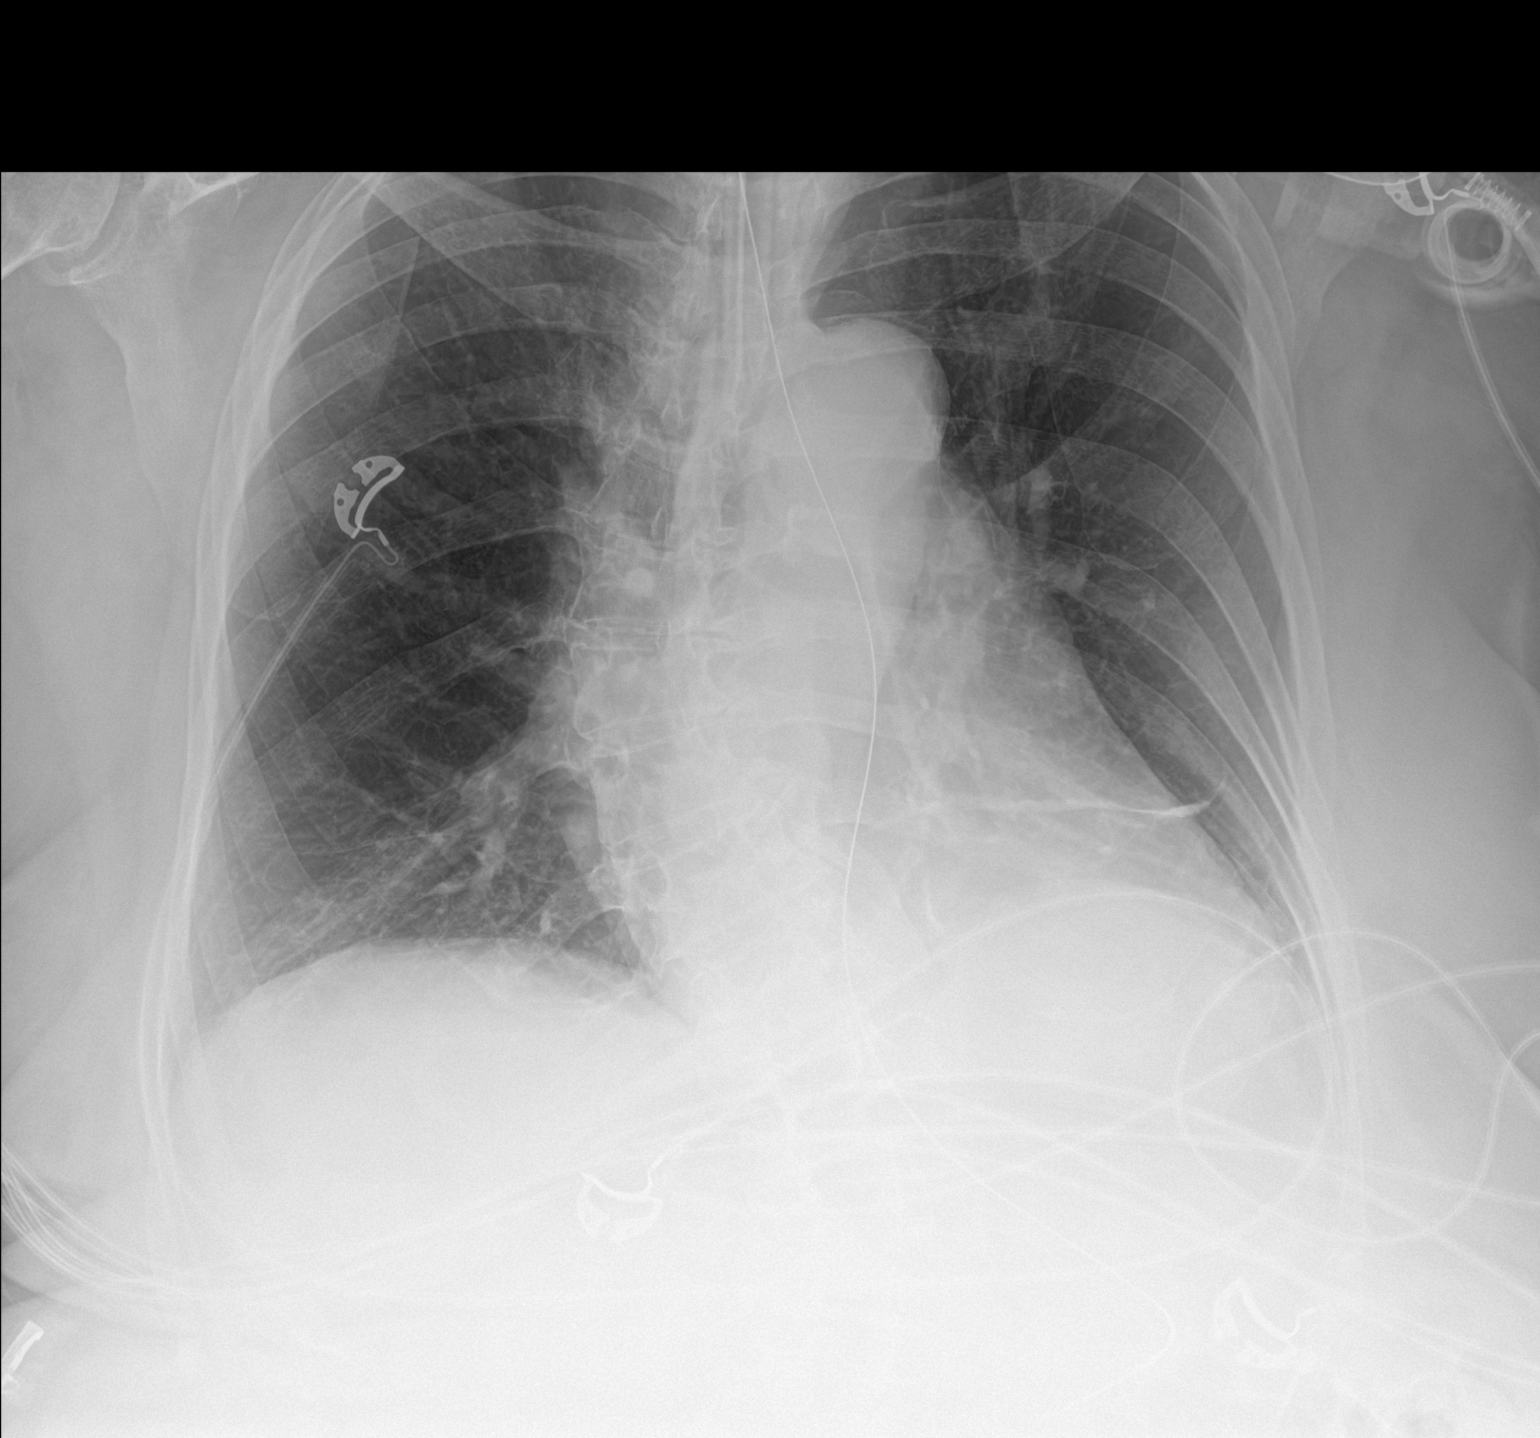

[1 of 1 positions shown; findings below may reference images not displayed]

FINDINGS: The heart size is borderline and stable. The hila and mediastinum
are unchanged. The ET tube is in good position. The NG tube
terminates below today's film. Atelectasis in the left base. No
pneumothorax. No other abnormalities.
IMPRESSION: 1. Support apparatus as above.
2. Atelectasis in the left base.
3. No other changes.

## 2020-11-20 IMAGING — DX DG CHEST 1V PORT
1 series · 1 of 1 positions shown · non-contrast
Comparison: January 19, 2020

CLINICAL DATA: Hypoxia

EXAM:
PORTABLE CHEST 1 VIEW

[chest ap]
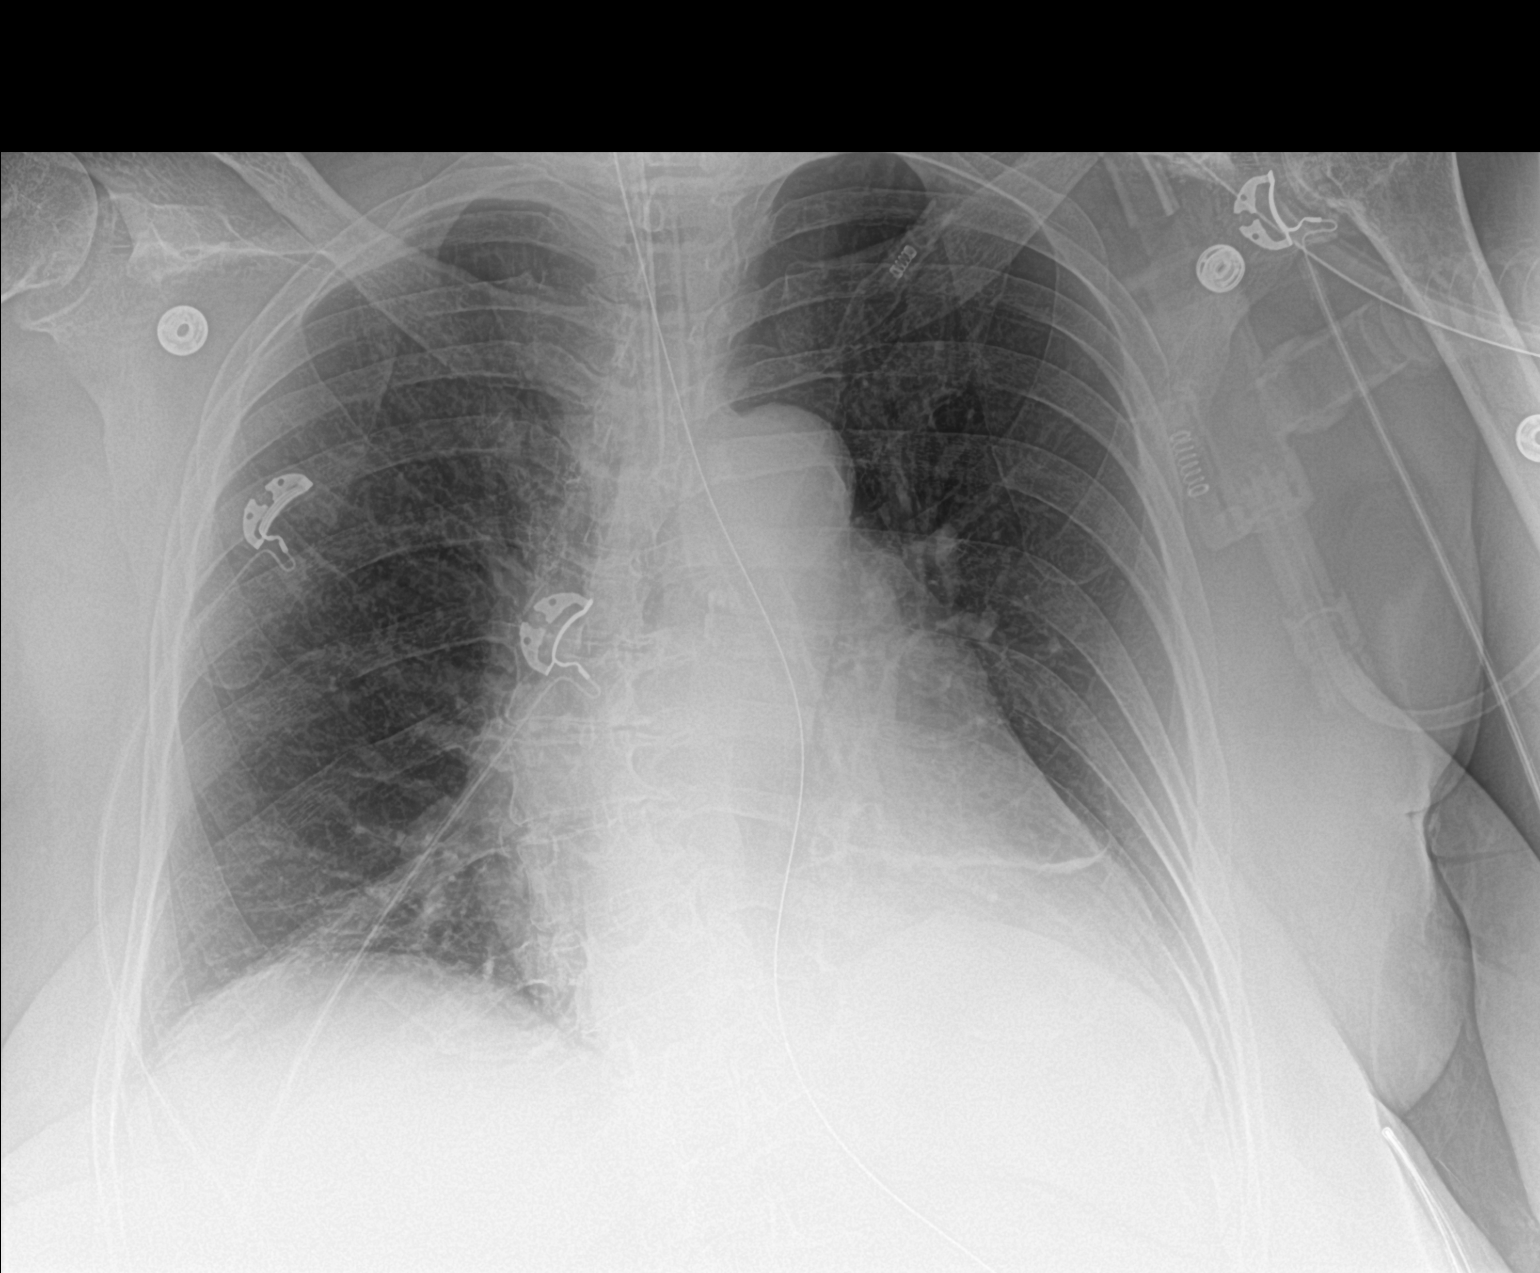

[1 of 1 positions shown; findings below may reference images not displayed]

FINDINGS: Endotracheal tube tip is 2.7 cm above the carina. Nasogastric tube
tip and side port are below the diaphragm. No pneumothorax. There is
bibasilar atelectasis, slightly more notable on the left than on the
right. Lungs elsewhere are clear. Heart is upper normal in size with
pulmonary vascularity normal. No adenopathy. There is lower thoracic
dextroscoliosis.
IMPRESSION: Tube positions as described without pneumothorax. Bibasilar
atelectasis. Lungs elsewhere clear. Stable cardiac silhouette.

## 2020-11-21 IMAGING — DX DG CHEST 1V PORT
1 series · 1 of 1 positions shown · non-contrast
Comparison: Portable chest 01/20/2020 and earlier.

CLINICAL DATA: 78-year-old female status post code stroke
presentation with endovascular revascularization of the left MCA.
Remains intubated.

EXAM:
PORTABLE CHEST 1 VIEW

[chest ap]
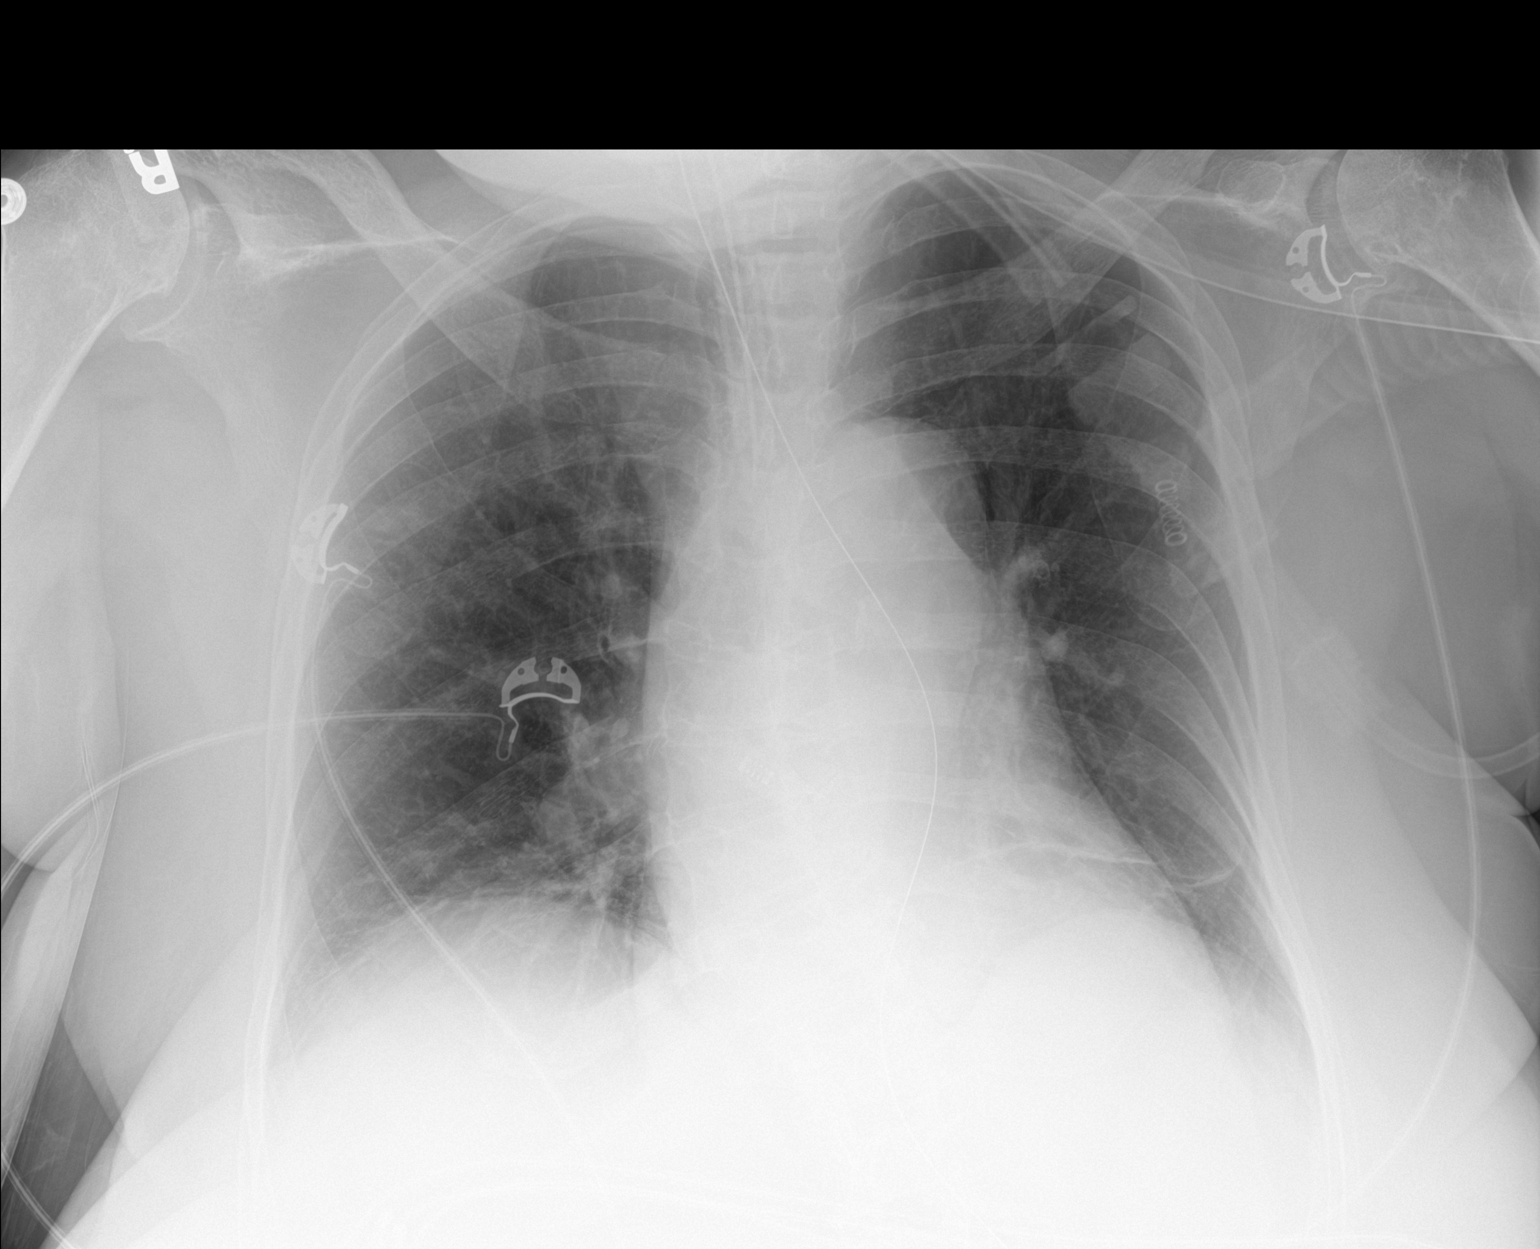

[1 of 1 positions shown; findings below may reference images not displayed]

FINDINGS: Portable AP semi upright view at 5656 hours. Stable endotracheal
tube tip between the level the clavicles and carina. Enteric tube
courses to the abdomen, tip not included. Stable lung volumes and
mediastinal contours. No pneumothorax, pulmonary edema or pleural
effusion. Linear platelike atelectasis at both lung bases is stable.
Stable visualized osseous structures. Paucity of bowel gas in the
upper abdomen.
IMPRESSION: 1.  Stable lines and tubes.
2. Stable ventilation with mild bibasilar atelectasis.

## 2020-11-30 ENCOUNTER — Other Ambulatory Visit: Payer: Medicare HMO

## 2023-11-16 ENCOUNTER — Other Ambulatory Visit (HOSPITAL_COMMUNITY): Payer: Self-pay
